# Patient Record
Sex: Female | Born: 1945 | Race: White | Hispanic: No | Marital: Married | Smoking: Never smoker
Health system: Southern US, Community
[De-identification: ages and names within clinical notes are randomized; demographics above are authoritative.]

## PROBLEM LIST (undated history)

## (undated) DIAGNOSIS — R112 Nausea with vomiting, unspecified: Secondary | ICD-10-CM

## (undated) DIAGNOSIS — K449 Diaphragmatic hernia without obstruction or gangrene: Secondary | ICD-10-CM

## (undated) DIAGNOSIS — Z923 Personal history of irradiation: Secondary | ICD-10-CM

## (undated) DIAGNOSIS — Z9889 Other specified postprocedural states: Secondary | ICD-10-CM

## (undated) DIAGNOSIS — M1990 Unspecified osteoarthritis, unspecified site: Secondary | ICD-10-CM

## (undated) DIAGNOSIS — Z803 Family history of malignant neoplasm of breast: Secondary | ICD-10-CM

## (undated) DIAGNOSIS — C50912 Malignant neoplasm of unspecified site of left female breast: Principal | ICD-10-CM

## (undated) HISTORY — PX: TONSILLECTOMY: SUR1361

## (undated) HISTORY — PX: BREAST LUMPECTOMY: SHX2

## (undated) HISTORY — DX: Family history of malignant neoplasm of breast: Z80.3

## (undated) HISTORY — PX: COLONOSCOPY: SHX174

## (undated) HISTORY — DX: Malignant neoplasm of unspecified site of left female breast: C50.912

---

## 2000-04-27 ENCOUNTER — Encounter: Payer: Self-pay | Admitting: Obstetrics and Gynecology

## 2000-04-27 ENCOUNTER — Encounter: Admission: RE | Admit: 2000-04-27 | Discharge: 2000-04-27 | Payer: Self-pay | Admitting: Obstetrics and Gynecology

## 2002-08-25 ENCOUNTER — Encounter: Payer: Self-pay | Admitting: Obstetrics and Gynecology

## 2002-08-25 ENCOUNTER — Encounter: Admission: RE | Admit: 2002-08-25 | Discharge: 2002-08-25 | Payer: Self-pay | Admitting: Obstetrics and Gynecology

## 2002-08-31 ENCOUNTER — Encounter: Payer: Self-pay | Admitting: Obstetrics and Gynecology

## 2002-08-31 ENCOUNTER — Encounter: Admission: RE | Admit: 2002-08-31 | Discharge: 2002-08-31 | Payer: Self-pay | Admitting: Obstetrics and Gynecology

## 2004-03-28 ENCOUNTER — Encounter: Admission: RE | Admit: 2004-03-28 | Discharge: 2004-03-28 | Payer: Self-pay | Admitting: Obstetrics and Gynecology

## 2005-05-14 ENCOUNTER — Encounter: Admission: RE | Admit: 2005-05-14 | Discharge: 2005-05-14 | Payer: Self-pay | Admitting: Obstetrics and Gynecology

## 2006-06-09 ENCOUNTER — Encounter: Admission: RE | Admit: 2006-06-09 | Discharge: 2006-06-09 | Payer: Self-pay | Admitting: Obstetrics and Gynecology

## 2006-09-10 ENCOUNTER — Encounter: Admission: RE | Admit: 2006-09-10 | Discharge: 2006-09-10 | Payer: Self-pay | Admitting: Family Medicine

## 2007-06-29 ENCOUNTER — Encounter: Admission: RE | Admit: 2007-06-29 | Discharge: 2007-06-29 | Payer: Self-pay | Admitting: Obstetrics & Gynecology

## 2008-07-03 ENCOUNTER — Encounter: Admission: RE | Admit: 2008-07-03 | Discharge: 2008-07-03 | Payer: Self-pay | Admitting: Obstetrics and Gynecology

## 2008-08-04 HISTORY — PX: OTHER SURGICAL HISTORY: SHX169

## 2009-07-04 ENCOUNTER — Encounter: Admission: RE | Admit: 2009-07-04 | Discharge: 2009-07-04 | Payer: Self-pay | Admitting: Obstetrics and Gynecology

## 2009-07-11 ENCOUNTER — Encounter: Admission: RE | Admit: 2009-07-11 | Discharge: 2009-07-11 | Payer: Self-pay | Admitting: Obstetrics and Gynecology

## 2010-01-11 ENCOUNTER — Encounter: Admission: RE | Admit: 2010-01-11 | Discharge: 2010-01-11 | Payer: Self-pay | Admitting: Family Medicine

## 2010-01-29 ENCOUNTER — Encounter: Admission: RE | Admit: 2010-01-29 | Discharge: 2010-01-29 | Payer: Self-pay | Admitting: Gastroenterology

## 2010-02-26 ENCOUNTER — Ambulatory Visit (HOSPITAL_COMMUNITY): Admission: RE | Admit: 2010-02-26 | Discharge: 2010-02-26 | Payer: Self-pay | Admitting: Gastroenterology

## 2010-05-03 ENCOUNTER — Ambulatory Visit (HOSPITAL_COMMUNITY): Admission: RE | Admit: 2010-05-03 | Discharge: 2010-05-05 | Payer: Self-pay | Admitting: General Surgery

## 2010-06-28 ENCOUNTER — Encounter: Admission: RE | Admit: 2010-06-28 | Discharge: 2010-06-28 | Payer: Self-pay | Admitting: General Surgery

## 2010-07-12 ENCOUNTER — Encounter
Admission: RE | Admit: 2010-07-12 | Discharge: 2010-07-12 | Payer: Self-pay | Source: Home / Self Care | Attending: Obstetrics and Gynecology | Admitting: Obstetrics and Gynecology

## 2010-07-18 ENCOUNTER — Encounter
Admission: RE | Admit: 2010-07-18 | Discharge: 2010-07-18 | Payer: Self-pay | Source: Home / Self Care | Attending: Gastroenterology | Admitting: Gastroenterology

## 2010-10-17 LAB — URINALYSIS, ROUTINE W REFLEX MICROSCOPIC
Glucose, UA: NEGATIVE mg/dL
Hgb urine dipstick: NEGATIVE
Ketones, ur: NEGATIVE mg/dL
pH: 5.5 (ref 5.0–8.0)

## 2010-10-17 LAB — COMPREHENSIVE METABOLIC PANEL
AST: 22 U/L (ref 0–37)
Albumin: 4.4 g/dL (ref 3.5–5.2)
CO2: 29 mEq/L (ref 19–32)
Calcium: 9.8 mg/dL (ref 8.4–10.5)
Creatinine, Ser: 0.66 mg/dL (ref 0.4–1.2)
GFR calc Af Amer: >60 mL/min (ref >60)
GFR calc non Af Amer: >60 mL/min (ref >60)
Sodium: 140 mEq/L (ref 135–145)
Total Protein: 7.4 g/dL (ref 6.0–8.3)

## 2010-10-17 LAB — CBC
MCH: 29 pg (ref 26.0–34.0)
MCHC: 33.6 g/dL (ref 30.0–36.0)
Platelets: 266 10*3/uL (ref 150–400)
RDW: 14.8 % (ref 11.5–15.5)

## 2010-10-17 LAB — DIFFERENTIAL
Eosinophils Relative: 5 % (ref 0–5)
Lymphocytes Relative: 37 % (ref 12–46)
Lymphs Abs: 1.9 10*3/uL (ref 0.7–4.0)
Monocytes Relative: 9 % (ref 3–12)

## 2010-10-17 LAB — SURGICAL PCR SCREEN: Staphylococcus aureus: NEGATIVE

## 2011-06-12 ENCOUNTER — Other Ambulatory Visit: Payer: Self-pay | Admitting: Obstetrics and Gynecology

## 2011-06-12 DIAGNOSIS — Z1231 Encounter for screening mammogram for malignant neoplasm of breast: Secondary | ICD-10-CM

## 2011-07-14 ENCOUNTER — Ambulatory Visit
Admission: RE | Admit: 2011-07-14 | Discharge: 2011-07-14 | Disposition: A | Discharge status: Home / Self Care | Payer: BC Managed Care – PPO | Source: Ambulatory Visit | Attending: Obstetrics and Gynecology | Admitting: Obstetrics and Gynecology

## 2011-07-14 ENCOUNTER — Other Ambulatory Visit: Payer: Self-pay | Admitting: Gastroenterology

## 2011-07-14 DIAGNOSIS — R935 Abnormal findings on diagnostic imaging of other abdominal regions, including retroperitoneum: Secondary | ICD-10-CM

## 2011-07-14 DIAGNOSIS — Z1231 Encounter for screening mammogram for malignant neoplasm of breast: Secondary | ICD-10-CM

## 2011-07-21 ENCOUNTER — Ambulatory Visit
Admission: RE | Admit: 2011-07-21 | Discharge: 2011-07-21 | Disposition: A | Discharge status: Home / Self Care | Payer: BC Managed Care – PPO | Source: Ambulatory Visit | Attending: Gastroenterology | Admitting: Gastroenterology

## 2011-07-21 DIAGNOSIS — R935 Abnormal findings on diagnostic imaging of other abdominal regions, including retroperitoneum: Secondary | ICD-10-CM

## 2012-06-09 ENCOUNTER — Other Ambulatory Visit: Payer: Self-pay | Admitting: Obstetrics and Gynecology

## 2012-06-09 DIAGNOSIS — Z1231 Encounter for screening mammogram for malignant neoplasm of breast: Secondary | ICD-10-CM

## 2012-07-19 ENCOUNTER — Ambulatory Visit
Admission: RE | Admit: 2012-07-19 | Discharge: 2012-07-19 | Disposition: A | Discharge status: Home / Self Care | Payer: Medicare Other | Source: Ambulatory Visit | Attending: Obstetrics and Gynecology | Admitting: Obstetrics and Gynecology

## 2012-07-19 DIAGNOSIS — Z1231 Encounter for screening mammogram for malignant neoplasm of breast: Secondary | ICD-10-CM

## 2012-07-20 ENCOUNTER — Other Ambulatory Visit: Payer: Self-pay | Admitting: Gastroenterology

## 2012-07-20 DIAGNOSIS — R935 Abnormal findings on diagnostic imaging of other abdominal regions, including retroperitoneum: Secondary | ICD-10-CM

## 2012-08-09 ENCOUNTER — Ambulatory Visit
Admission: RE | Admit: 2012-08-09 | Discharge: 2012-08-09 | Disposition: A | Discharge status: Home / Self Care | Payer: Medicare Other | Source: Ambulatory Visit | Attending: Gastroenterology | Admitting: Gastroenterology

## 2012-08-09 DIAGNOSIS — R935 Abnormal findings on diagnostic imaging of other abdominal regions, including retroperitoneum: Secondary | ICD-10-CM

## 2013-02-07 ENCOUNTER — Other Ambulatory Visit (HOSPITAL_COMMUNITY)
Admission: RE | Admit: 2013-02-07 | Discharge: 2013-02-07 | Disposition: A | Discharge status: Home / Self Care | Payer: Medicare Other | Source: Ambulatory Visit | Attending: Family Medicine | Admitting: Family Medicine

## 2013-02-07 ENCOUNTER — Other Ambulatory Visit: Payer: Self-pay | Admitting: Family Medicine

## 2013-02-07 DIAGNOSIS — Z124 Encounter for screening for malignant neoplasm of cervix: Secondary | ICD-10-CM | POA: Insufficient documentation

## 2013-02-07 DIAGNOSIS — Z1151 Encounter for screening for human papillomavirus (HPV): Secondary | ICD-10-CM | POA: Insufficient documentation

## 2013-04-13 ENCOUNTER — Other Ambulatory Visit: Payer: Self-pay | Admitting: Dermatology

## 2013-06-22 ENCOUNTER — Other Ambulatory Visit: Payer: Self-pay

## 2013-06-22 DIAGNOSIS — Z1231 Encounter for screening mammogram for malignant neoplasm of breast: Secondary | ICD-10-CM

## 2013-06-24 ENCOUNTER — Other Ambulatory Visit: Payer: Self-pay | Admitting: Dermatology

## 2013-07-25 ENCOUNTER — Ambulatory Visit
Admission: RE | Admit: 2013-07-25 | Discharge: 2013-07-25 | Disposition: A | Discharge status: Home / Self Care | Payer: Medicare Other | Source: Ambulatory Visit

## 2013-07-25 DIAGNOSIS — Z1231 Encounter for screening mammogram for malignant neoplasm of breast: Secondary | ICD-10-CM

## 2013-12-19 ENCOUNTER — Other Ambulatory Visit: Payer: Self-pay | Admitting: Orthopedic Surgery

## 2014-01-02 ENCOUNTER — Encounter (HOSPITAL_COMMUNITY): Payer: Self-pay

## 2014-01-02 NOTE — Pre-Procedure Instructions (Deleted)
TREVIA NOP  01/02/2014   Your procedure is scheduled on:  01/10/14  Report to [facility] Admitting at 530 AM.  Call this number if you have problems the morning of surgery: [phone]   Remember:   Do not eat food or drink liquids after midnight.   Take these medicines the morning of surgery with A SIP OF WATER: none   Do not wear jewelry, make-up or nail polish.  Do not wear lotions, powders, or perfumes. You may wear deodorant.  Do not shave 48 hours prior to surgery. Men may shave face and neck.  Do not bring valuables to the hospital.  [facility] is not responsible                  for any belongings or valuables.               Contacts, dentures or bridgework may not be worn into surgery.  Leave suitcase in the car. After surgery it may be brought to your room.  For patients admitted to the hospital, discharge time is determined by your                treatment team.               Patients discharged the day of surgery will not be allowed to drive  home.  Name and phone number of your driver: family  Special Instructions: Shower using CHG 2 nights before surgery and the night before surgery.  If you shower the day of surgery use CHG.  Use special wash - you have one bottle of CHG for all showers.  You should use approximately 1/3 of the bottle for each shower.   Please read over the following fact sheets that you were given: Pain Booklet, Coughing and Deep Breathing, Blood Transfusion Information, MRSA Information and Surgical Site Infection Prevention

## 2014-01-02 NOTE — Pre-Procedure Instructions (Deleted)
Melissa Holloway  01/02/2014   Your procedure is scheduled on:  01/10/14  Report to [facility] Admitting at 530 AM.  Call this number if you have problems the morning of surgery: [phone]   Remember:   Do not eat food or drink liquids after midnight.   Take these medicines the morning of surgery with A SIP OF WATER: none   Do not wear jewelry, make-up or nail polish.  Do not wear lotions, powders, or perfumes. You may wear deodorant.  Do not shave 48 hours prior to surgery. Men may shave face and neck.  Do not bring valuables to the hospital.  [name] is not responsible                  for any belongings or valuables.               Contacts, dentures or bridgework may not be worn into surgery.  Leave suitcase in the car. After surgery it may be brought to your room.  For patients admitted to the hospital, discharge time is determined by your                treatment team.               Patients discharged the day of surgery will not be allowed to drive  home.  Name and phone number of your driver: family  Special Instructions: Shower using CHG 2 nights before surgery and the night before surgery.  If you shower the day of surgery use CHG.  Use special wash - you have one bottle of CHG for all showers.  You should use approximately 1/3 of the bottle for each shower.   Please read over the following fact sheets that you were given: Pain Booklet, Coughing and Deep Breathing, Blood Transfusion Information, MRSA Information and Surgical Site Infection Prevention   

## 2014-01-02 NOTE — Pre-Procedure Instructions (Deleted)
Melissa Holloway  01/02/2014   Your procedure is scheduled on:  01/10/14  Report to [facility] Admitting at 530 AM.  Call this number if you have problems the morning of surgery: [phone]   Remember:   Do not eat food or drink liquids after midnight.   Take these medicines the morning of surgery with A SIP OF WATER: none   Do not wear jewelry, make-up or nail polish.  Do not wear lotions, powders, or perfumes. You may wear deodorant.  Do not shave 48 hours prior to surgery. Men may shave face and neck.  Do not bring valuables to the hospital.  [facility] is not responsible                  for any belongings or valuables.               Contacts, dentures or bridgework may not be worn into surgery.  Leave suitcase in the car. After surgery it may be brought to your room.  For patients admitted to the hospital, discharge time is determined by your                treatment team.               Patients discharged the day of surgery will not be allowed to drive  home.  Name and phone number of your driver: family  Special Instructions: Shower using CHG 2 nights before surgery and the night before surgery.  If you shower the day of surgery use CHG.  Use special wash - you have one bottle of CHG for all showers.  You should use approximately 1/3 of the bottle for each shower.   Please read over the following fact sheets that you were given: Pain Booklet, Coughing and Deep Breathing, Blood Transfusion Information, MRSA Information and Surgical Site Infection Prevention   

## 2014-01-03 ENCOUNTER — Encounter (HOSPITAL_COMMUNITY)
Admission: RE | Admit: 2014-01-03 | Discharge: 2014-01-03 | Disposition: A | Discharge status: Home / Self Care | Payer: Medicare Other | Source: Ambulatory Visit | Attending: Orthopedic Surgery | Admitting: Orthopedic Surgery

## 2014-01-03 ENCOUNTER — Encounter (HOSPITAL_COMMUNITY): Payer: Self-pay

## 2014-01-03 DIAGNOSIS — Z01812 Encounter for preprocedural laboratory examination: Secondary | ICD-10-CM | POA: Insufficient documentation

## 2014-01-03 DIAGNOSIS — Z01810 Encounter for preprocedural cardiovascular examination: Secondary | ICD-10-CM | POA: Diagnosis not present

## 2014-01-03 DIAGNOSIS — Z01818 Encounter for other preprocedural examination: Secondary | ICD-10-CM | POA: Insufficient documentation

## 2014-01-03 HISTORY — DX: Diaphragmatic hernia without obstruction or gangrene: K44.9

## 2014-01-03 LAB — TYPE AND SCREEN
ABO/RH(D): O POS
ANTIBODY SCREEN: NEGATIVE

## 2014-01-03 LAB — SURGICAL PCR SCREEN
MRSA, PCR: NEGATIVE
Staphylococcus aureus: NEGATIVE

## 2014-01-03 LAB — BASIC METABOLIC PANEL
BUN: 13 mg/dL (ref 6–23)
CO2: 26 mEq/L (ref 19–32)
CREATININE: 0.62 mg/dL (ref 0.50–1.10)
Calcium: 9.6 mg/dL (ref 8.4–10.5)
Chloride: 103 mEq/L (ref 96–112)
GFR calc Af Amer: >90 mL/min (ref >90)
GFR calc non Af Amer: >90 mL/min (ref >90)
Glucose, Bld: 95 mg/dL (ref 70–99)
Potassium: 3.9 mEq/L (ref 3.7–5.3)
Sodium: 141 mEq/L (ref 137–147)

## 2014-01-03 LAB — CBC WITH DIFFERENTIAL/PLATELET
BASOS PCT: 2 % — AB (ref 0–1)
Basophils Absolute: 0.1 10*3/uL (ref 0.0–0.1)
EOS PCT: 3 % (ref 0–5)
Eosinophils Absolute: 0.1 10*3/uL (ref 0.0–0.7)
HCT: 41.7 % (ref 36.0–46.0)
Hemoglobin: 13.7 g/dL (ref 12.0–15.0)
Lymphocytes Relative: 38 % (ref 12–46)
Lymphs Abs: 1.6 10*3/uL (ref 0.7–4.0)
MCH: 28.8 pg (ref 26.0–34.0)
MCHC: 32.9 g/dL (ref 30.0–36.0)
MCV: 87.8 fL (ref 78.0–100.0)
MONO ABS: 0.3 10*3/uL (ref 0.1–1.0)
Monocytes Relative: 6 % (ref 3–12)
NEUTROS ABS: 2.1 10*3/uL (ref 1.7–7.7)
Neutrophils Relative %: 51 % (ref 43–77)
PLATELETS: 247 10*3/uL (ref 150–400)
RBC: 4.75 MIL/uL (ref 3.87–5.11)
RDW: 14.4 % (ref 11.5–15.5)
WBC: 4.1 10*3/uL (ref 4.0–10.5)

## 2014-01-03 LAB — URINALYSIS, ROUTINE W REFLEX MICROSCOPIC
BILIRUBIN URINE: NEGATIVE
GLUCOSE, UA: NEGATIVE mg/dL
HGB URINE DIPSTICK: NEGATIVE
Ketones, ur: NEGATIVE mg/dL
Nitrite: NEGATIVE
Protein, ur: NEGATIVE mg/dL
SPECIFIC GRAVITY, URINE: 1.012 (ref 1.005–1.030)
UROBILINOGEN UA: 0.2 mg/dL (ref 0.0–1.0)
pH: 6 (ref 5.0–8.0)

## 2014-01-03 LAB — APTT: aPTT: 29 seconds (ref 24–37)

## 2014-01-03 LAB — PROTIME-INR
INR: 1.07 (ref 0.00–1.49)
PROTHROMBIN TIME: 13.7 s (ref 11.6–15.2)

## 2014-01-03 LAB — URINE MICROSCOPIC-ADD ON

## 2014-01-03 LAB — ABO/RH: ABO/RH(D): O POS

## 2014-01-03 NOTE — Pre-Procedure Instructions (Addendum)
Melissa Holloway  01/03/2014   Your procedure is scheduled on:  01/09/14  Report to [facility] short stay admitting at 530 AM.  Call this number if you have problems the morning of surgery: [phone]   Remember:   Do not eat food or drink liquids after midnight.   Take these medicines the morning of surgery with A SIP OF WATER: eye drops if needed        Take all meds until day of surgery except as instructed below or per dr     Bridgette Habermann all herbel meds, nsaids (aleve,naproxen,advil,ibuprofen) starting tomorrow including vitamins, herbal meds   Do not wear jewelry, make-up or nail polish.  Do not wear lotions, powders, or perfumes. You may wear deodorant.  Do not shave 48 hours prior to surgery. Men may shave face and neck.  Do not bring valuables to the hospital.  [facility] is not responsible                  for any belongings or valuables.               Contacts, dentures or bridgework may not be worn into surgery.  Leave suitcase in the car. After surgery it may be brought to your room.  For patients admitted to the hospital, discharge time is determined by your                treatment team.               Patients discharged the day of surgery will not be allowed to drive  home.  Name and phone number of your driver:   Special Instructions:  Special Instructions: [facility] - Preparing for Surgery  Before surgery, you can play an important role.  Because skin is not sterile, your skin needs to be as free of germs as possible.  You can reduce the number of germs on you skin by washing with CHG (chlorahexidine gluconate) soap before surgery.  CHG is an antiseptic cleaner which kills germs and bonds with the skin to continue killing germs even after washing.  Please DO NOT use if you have an allergy to CHG or antibacterial soaps.  If your skin becomes reddened/irritated stop using the CHG and inform your nurse when you arrive at Short Stay.  Do not shave (including legs and underarms)  for at least 48 hours prior to the first CHG shower.  You may shave your face.  Please follow these instructions carefully:   1.  Shower with CHG Soap the night before surgery and the morning of Surgery.  2.  If you choose to wash your hair, wash your hair first as usual with your normal shampoo.  3.  After you shampoo, rinse your hair and body thoroughly to remove the Shampoo.  4.  Use CHG as you would any other liquid soap.  You can apply chg directly  to the skin and wash gently with scrungie or a clean washcloth.  5.  Apply the CHG Soap to your body ONLY FROM THE NECK DOWN.  Do not use on open wounds or open sores.  Avoid contact with your eyes ears, mouth and genitals (private parts).  Wash genitals (private parts)       with your normal soap.  6.  Wash thoroughly, paying special attention to the area where your surgery will be performed.  7.  Thoroughly rinse your body with warm water from the neck down.  8.  DO NOT shower/wash with your normal soap after using and rinsing off the CHG Soap.  9.  Pat yourself dry with a clean towel.            10.  Wear clean pajamas.            11.  Place clean sheets on your bed the night of your first shower and do not sleep with pets.  Day of Surgery  Do not apply any lotions/deodorants the morning of surgery.  Please wear clean clothes to the hospital/surgery center.   Please read over the following fact sheets that you were given: Pain Booklet, Coughing and Deep Breathing, Blood Transfusion Information, Total Joint Packet, MRSA Information and Surgical Site Infection Prevention

## 2014-01-05 NOTE — H&P (Signed)
TOTAL KNEE ADMISSION H&P  Patient is being admitted for left total knee arthroplasty.  Subjective:  Chief Complaint:left knee pain.  HPI: Melissa Holloway, 68 y.o. female, has a history of pain and functional disability in the left knee due to arthritis and has failed non-surgical conservative treatments for greater than 12 weeks to includeNSAID's and/or analgesics, corticosteriod injections, flexibility and strengthening excercises and activity modification.  Onset of symptoms was gradual, starting 3 years ago with gradually worsening course since that time. The patient noted no past surgery on the left knee(s).  Patient currently rates pain in the left knee(s) at 10 out of 10 with activity. Patient has night pain, worsening of pain with activity and weight bearing, pain that interferes with activities of daily living, pain with passive range of motion, crepitus and joint swelling.  Patient has evidence of periarticular osteophytes and joint space narrowing by imaging studies.  There is no active infection.  There are no active problems to display for this patient.  Past Medical History  Diagnosis Date  . Esophageal hernia     s/p    Past Surgical History  Procedure Laterality Date  . Esophageal hernia  10  . Tonsillectomy      No prescriptions prior to admission   No Known Allergies  History  Substance Use Topics  . Smoking status: Never Smoker   . Smokeless tobacco: Not on file  . Alcohol Use: No    No family history on file.   Review of Systems  Constitutional: Negative.   HENT: Negative.   Eyes:       Wears glasses  Respiratory: Negative.   Cardiovascular: Negative.   Gastrointestinal: Negative.   Genitourinary: Negative.   Musculoskeletal: Positive for joint pain.  Skin: Negative.   Neurological: Negative.   Endo/Heme/Allergies: Bruises/bleeds easily.  Psychiatric/Behavioral: Negative.     Objective:  Physical Exam  Constitutional: She is oriented to person,  place, and time. She appears well-developed and well-nourished.  HENT:  Head: Normocephalic and atraumatic.  Eyes: Pupils are equal, round, and reactive to light.  Neck: Normal range of motion. Neck supple.  Cardiovascular: Intact distal pulses.   Respiratory: Effort normal.  Musculoskeletal: She exhibits tenderness.  Neurological: She is alert and oriented to person, place, and time.  Skin: Skin is warm and dry.  Psychiatric: She has a normal mood and affect. Her behavior is normal. Judgment and thought content normal.    Vital signs in last 24 hours:    Labs:   There is no height or weight on file to calculate BMI.   Imaging Review Radiographs:  X-rays were ordered, performed, and interpreted by me today included; standing AP and lateral x-ray of the left knee today showed continue to bone on bone arthritis now with erosion of the femur into the tibia medially and lateral subluxation of the tibia about 5 mm.  There are similar changes on the right knee as well, but that does not seem to hurt as much.  Assessment/Plan:  End stage arthritis, left knee   The patient history, physical examination, clinical judgment of the provider and imaging studies are consistent with end stage degenerative joint disease of the left knee(s) and total knee arthroplasty is deemed medically necessary. The treatment options including medical management, injection therapy arthroscopy and arthroplasty were discussed at length. The risks and benefits of total knee arthroplasty were presented and reviewed. The risks due to aseptic loosening, infection, stiffness, patella tracking problems, thromboembolic complications and other imponderables  were discussed. The patient acknowledged the explanation, agreed to proceed with the plan and consent was signed. Patient is being admitted for inpatient treatment for surgery, pain control, PT, OT, prophylactic antibiotics, VTE prophylaxis, progressive ambulation and ADL's  and discharge planning. The patient is planning to be discharged home with home health services

## 2014-01-08 MED ORDER — CEFAZOLIN SODIUM-DEXTROSE 2-3 GM-% IV SOLR
2.0000 g | INTRAVENOUS | Status: AC
  Administered 2014-01-09: 2 g via INTRAVENOUS
  Filled 2014-01-08: qty 50

## 2014-01-09 ENCOUNTER — Inpatient Hospital Stay (HOSPITAL_COMMUNITY): Payer: Medicare Other | Admitting: Anesthesiology

## 2014-01-09 ENCOUNTER — Encounter (HOSPITAL_COMMUNITY): Payer: Medicare Other | Admitting: Anesthesiology

## 2014-01-09 ENCOUNTER — Encounter (HOSPITAL_COMMUNITY): Payer: Self-pay | Admitting: Anesthesiology

## 2014-01-09 ENCOUNTER — Inpatient Hospital Stay (HOSPITAL_COMMUNITY)
Admission: RE | Admit: 2014-01-09 | Discharge: 2014-01-11 | DRG: 470 | Disposition: A | Discharge status: Home Health | Payer: Medicare Other | Source: Ambulatory Visit | Attending: Orthopedic Surgery | Admitting: Orthopedic Surgery

## 2014-01-09 ENCOUNTER — Encounter (HOSPITAL_COMMUNITY)
Admission: RE | Disposition: A | Discharge status: Home Health | Payer: Self-pay | Source: Ambulatory Visit | Attending: Orthopedic Surgery

## 2014-01-09 DIAGNOSIS — M1710 Unilateral primary osteoarthritis, unspecified knee: Principal | ICD-10-CM | POA: Diagnosis present

## 2014-01-09 DIAGNOSIS — M1712 Unilateral primary osteoarthritis, left knee: Secondary | ICD-10-CM

## 2014-01-09 HISTORY — PX: TOTAL KNEE ARTHROPLASTY: SHX125

## 2014-01-09 SURGERY — ARTHROPLASTY, KNEE, TOTAL
Anesthesia: General | Site: Knee | Laterality: Left

## 2014-01-09 MED ORDER — ROCURONIUM BROMIDE 50 MG/5ML IV SOLN
INTRAVENOUS | Status: AC
  Filled 2014-01-09: qty 1

## 2014-01-09 MED ORDER — HYDROMORPHONE HCL PF 1 MG/ML IJ SOLN
1.0000 mg | INTRAMUSCULAR | Status: DC | PRN
  Administered 2014-01-09 (×2): 1 mg via INTRAVENOUS
  Filled 2014-01-09 (×2): qty 1

## 2014-01-09 MED ORDER — DIPHENHYDRAMINE HCL 12.5 MG/5ML PO ELIX: 12.5000 mg | ORAL_SOLUTION | ORAL | Status: DC | PRN

## 2014-01-09 MED ORDER — DEXTROSE-NACL 5-0.45 % IV SOLN: INTRAVENOUS | Status: DC

## 2014-01-09 MED ORDER — OXYCODONE HCL 5 MG PO TABS
ORAL_TABLET | ORAL | Status: AC
  Filled 2014-01-09: qty 2

## 2014-01-09 MED ORDER — SUFENTANIL CITRATE 50 MCG/ML IV SOLN
INTRAVENOUS | Status: DC | PRN
  Administered 2014-01-09: 15 ug via INTRAVENOUS
  Administered 2014-01-09: 10 ug via INTRAVENOUS
  Administered 2014-01-09: 5 ug via INTRAVENOUS

## 2014-01-09 MED ORDER — MENTHOL 3 MG MT LOZG: 1.0000 | LOZENGE | OROMUCOSAL | Status: DC | PRN

## 2014-01-09 MED ORDER — KCL IN DEXTROSE-NACL 20-5-0.45 MEQ/L-%-% IV SOLN
INTRAVENOUS | Status: AC
  Filled 2014-01-09: qty 1000

## 2014-01-09 MED ORDER — HYDROMORPHONE HCL PF 1 MG/ML IJ SOLN
0.2500 mg | INTRAMUSCULAR | Status: DC | PRN
  Administered 2014-01-09 (×2): 0.5 mg via INTRAVENOUS
  Administered 2014-01-09 (×2): 0.25 mg via INTRAVENOUS

## 2014-01-09 MED ORDER — ONDANSETRON HCL 4 MG PO TABS: 4.0000 mg | ORAL_TABLET | Freq: Four times a day (QID) | ORAL | Status: DC | PRN

## 2014-01-09 MED ORDER — ROPIVACAINE HCL 5 MG/ML IJ SOLN
INTRAMUSCULAR | Status: DC | PRN
  Administered 2014-01-09: 25 mL via PERINEURAL

## 2014-01-09 MED ORDER — MAGNESIUM CITRATE PO SOLN: 1.0000 | Freq: Once | ORAL | Status: AC | PRN

## 2014-01-09 MED ORDER — MIDAZOLAM HCL 5 MG/5ML IJ SOLN
INTRAMUSCULAR | Status: DC | PRN
  Administered 2014-01-09: 2 mg via INTRAVENOUS

## 2014-01-09 MED ORDER — LIDOCAINE HCL 1 % IJ SOLN
INTRAMUSCULAR | Status: DC | PRN
  Administered 2014-01-09: 1 mL

## 2014-01-09 MED ORDER — ONDANSETRON HCL 4 MG/2ML IJ SOLN
INTRAMUSCULAR | Status: AC
  Filled 2014-01-09: qty 2

## 2014-01-09 MED ORDER — FENTANYL CITRATE 0.05 MG/ML IJ SOLN
INTRAMUSCULAR | Status: AC
  Filled 2014-01-09: qty 5

## 2014-01-09 MED ORDER — GLYCOPYRROLATE 0.2 MG/ML IJ SOLN
INTRAMUSCULAR | Status: DC | PRN
  Administered 2014-01-09: .4 mg via INTRAVENOUS

## 2014-01-09 MED ORDER — GLYCOPYRROLATE 0.2 MG/ML IJ SOLN
INTRAMUSCULAR | Status: AC
  Filled 2014-01-09: qty 2

## 2014-01-09 MED ORDER — ALUM & MAG HYDROXIDE-SIMETH 200-200-20 MG/5ML PO SUSP: 30.0000 mL | ORAL | Status: DC | PRN

## 2014-01-09 MED ORDER — SENNOSIDES-DOCUSATE SODIUM 8.6-50 MG PO TABS
1.0000 | ORAL_TABLET | Freq: Every evening | ORAL | Status: DC | PRN
Start: 2014-01-09 — End: 2014-01-11

## 2014-01-09 MED ORDER — SUFENTANIL CITRATE 50 MCG/ML IV SOLN
INTRAVENOUS | Status: AC
  Filled 2014-01-09: qty 1

## 2014-01-09 MED ORDER — OXYCODONE HCL 5 MG PO TABS
5.0000 mg | ORAL_TABLET | ORAL | Status: DC | PRN
  Administered 2014-01-09 (×2): 10 mg via ORAL
  Administered 2014-01-10: 5 mg via ORAL
  Administered 2014-01-10: 10 mg via ORAL
  Administered 2014-01-10: 5 mg via ORAL
  Administered 2014-01-10: 10 mg via ORAL
  Administered 2014-01-10: 5 mg via ORAL
  Administered 2014-01-11: 10 mg via ORAL
  Administered 2014-01-11: 5 mg via ORAL
  Filled 2014-01-09 (×2): qty 1
  Filled 2014-01-09 (×2): qty 2
  Filled 2014-01-09: qty 1
  Filled 2014-01-09 (×2): qty 2
  Filled 2014-01-09: qty 1
  Filled 2014-01-09: qty 2

## 2014-01-09 MED ORDER — TRANEXAMIC ACID 100 MG/ML IV SOLN
1000.0000 mg | INTRAVENOUS | Status: AC
  Administered 2014-01-09: 1000 mg via INTRAVENOUS
  Filled 2014-01-09: qty 10

## 2014-01-09 MED ORDER — ASPIRIN EC 325 MG PO TBEC
325.0000 mg | DELAYED_RELEASE_TABLET | Freq: Every day | ORAL | Status: DC
  Administered 2014-01-10 – 2014-01-11 (×2): 325 mg via ORAL
  Filled 2014-01-09 (×3): qty 1

## 2014-01-09 MED ORDER — BUPIVACAINE LIPOSOME 1.3 % IJ SUSP
20.0000 mL | Freq: Once | INTRAMUSCULAR | Status: DC
  Filled 2014-01-09: qty 20

## 2014-01-09 MED ORDER — ACETAMINOPHEN 650 MG RE SUPP: 650.0000 mg | Freq: Four times a day (QID) | RECTAL | Status: DC | PRN

## 2014-01-09 MED ORDER — ROCURONIUM BROMIDE 100 MG/10ML IV SOLN
INTRAVENOUS | Status: DC | PRN
  Administered 2014-01-09: 40 mg via INTRAVENOUS

## 2014-01-09 MED ORDER — CEFUROXIME SODIUM 1.5 G IJ SOLR
INTRAMUSCULAR | Status: DC | PRN
  Administered 2014-01-09: 1.5 g

## 2014-01-09 MED ORDER — PROPOFOL 10 MG/ML IV BOLUS
INTRAVENOUS | Status: AC
  Filled 2014-01-09: qty 20

## 2014-01-09 MED ORDER — LIDOCAINE HCL (CARDIAC) 20 MG/ML IV SOLN
INTRAVENOUS | Status: DC | PRN
  Administered 2014-01-09: 40 mg via INTRAVENOUS

## 2014-01-09 MED ORDER — ONDANSETRON HCL 4 MG/2ML IJ SOLN
INTRAMUSCULAR | Status: DC | PRN
  Administered 2014-01-09: 4 mg via INTRAVENOUS

## 2014-01-09 MED ORDER — OXYCODONE-ACETAMINOPHEN 5-325 MG PO TABS: 1.0000 | ORAL_TABLET | ORAL | Status: DC | PRN

## 2014-01-09 MED ORDER — LIDOCAINE HCL (CARDIAC) 20 MG/ML IV SOLN
INTRAVENOUS | Status: AC
  Filled 2014-01-09: qty 5

## 2014-01-09 MED ORDER — DOCUSATE SODIUM 100 MG PO CAPS
100.0000 mg | ORAL_CAPSULE | Freq: Two times a day (BID) | ORAL | Status: DC
  Administered 2014-01-09 – 2014-01-11 (×4): 100 mg via ORAL
  Filled 2014-01-09 (×4): qty 1

## 2014-01-09 MED ORDER — OXYCODONE HCL 5 MG/5ML PO SOLN: 5.0000 mg | Freq: Once | ORAL | Status: DC | PRN

## 2014-01-09 MED ORDER — SODIUM CHLORIDE 0.9 % IR SOLN
Status: DC | PRN
  Administered 2014-01-09: 3000 mL

## 2014-01-09 MED ORDER — METHOCARBAMOL 500 MG PO TABS: 500.0000 mg | ORAL_TABLET | Freq: Two times a day (BID) | ORAL | Status: DC

## 2014-01-09 MED ORDER — BISACODYL 5 MG PO TBEC: 5.0000 mg | DELAYED_RELEASE_TABLET | Freq: Every day | ORAL | Status: DC | PRN

## 2014-01-09 MED ORDER — ASPIRIN EC 325 MG PO TBEC
325.0000 mg | DELAYED_RELEASE_TABLET | Freq: Two times a day (BID) | ORAL | Status: DC
Start: 2014-01-09 — End: 2014-10-10

## 2014-01-09 MED ORDER — METOCLOPRAMIDE HCL 5 MG PO TABS
5.0000 mg | ORAL_TABLET | Freq: Three times a day (TID) | ORAL | Status: DC | PRN
  Filled 2014-01-09: qty 2

## 2014-01-09 MED ORDER — METOCLOPRAMIDE HCL 5 MG/ML IJ SOLN: 10.0000 mg | Freq: Once | INTRAMUSCULAR | Status: DC | PRN

## 2014-01-09 MED ORDER — PHENOL 1.4 % MT LIQD: 1.0000 | OROMUCOSAL | Status: DC | PRN

## 2014-01-09 MED ORDER — METHOCARBAMOL 500 MG PO TABS
ORAL_TABLET | ORAL | Status: AC
  Filled 2014-01-09: qty 1

## 2014-01-09 MED ORDER — ARTIFICIAL TEARS OP OINT
TOPICAL_OINTMENT | OPHTHALMIC | Status: DC | PRN
  Administered 2014-01-09: 1 via OPHTHALMIC

## 2014-01-09 MED ORDER — SODIUM CHLORIDE 0.9 % IJ SOLN
INTRAMUSCULAR | Status: AC
  Filled 2014-01-09: qty 10

## 2014-01-09 MED ORDER — NEOSTIGMINE METHYLSULFATE 10 MG/10ML IV SOLN
INTRAVENOUS | Status: AC
  Filled 2014-01-09: qty 1

## 2014-01-09 MED ORDER — METHOCARBAMOL 500 MG PO TABS
500.0000 mg | ORAL_TABLET | Freq: Four times a day (QID) | ORAL | Status: DC | PRN
  Administered 2014-01-09 – 2014-01-11 (×6): 500 mg via ORAL
  Filled 2014-01-09 (×5): qty 1

## 2014-01-09 MED ORDER — LACTATED RINGERS IV SOLN
INTRAVENOUS | Status: DC | PRN
  Administered 2014-01-09 (×2): via INTRAVENOUS

## 2014-01-09 MED ORDER — POLYVINYL ALCOHOL 1.4 % OP SOLN
1.0000 [drp] | Freq: Every day | OPHTHALMIC | Status: DC
  Filled 2014-01-09: qty 15

## 2014-01-09 MED ORDER — OXYCODONE HCL 5 MG PO TABS: 5.0000 mg | ORAL_TABLET | Freq: Once | ORAL | Status: DC | PRN

## 2014-01-09 MED ORDER — ONDANSETRON HCL 4 MG/2ML IJ SOLN
4.0000 mg | Freq: Four times a day (QID) | INTRAMUSCULAR | Status: DC | PRN
  Administered 2014-01-09: 4 mg via INTRAVENOUS
  Filled 2014-01-09: qty 2

## 2014-01-09 MED ORDER — PROPYLENE GLYCOL 0.6 % OP SOLN: 1.0000 [drp] | Freq: Every morning | OPHTHALMIC | Status: DC

## 2014-01-09 MED ORDER — METOCLOPRAMIDE HCL 5 MG/ML IJ SOLN
5.0000 mg | Freq: Three times a day (TID) | INTRAMUSCULAR | Status: DC | PRN
  Administered 2014-01-09: 10 mg via INTRAVENOUS
  Filled 2014-01-09: qty 2

## 2014-01-09 MED ORDER — SODIUM CHLORIDE 0.9 % IJ SOLN
INTRAMUSCULAR | Status: DC | PRN
  Administered 2014-01-09: 40 mL

## 2014-01-09 MED ORDER — ATORVASTATIN CALCIUM 20 MG PO TABS
20.0000 mg | ORAL_TABLET | Freq: Every day | ORAL | Status: DC
  Administered 2014-01-09 – 2014-01-11 (×3): 20 mg via ORAL
  Filled 2014-01-09 (×3): qty 1

## 2014-01-09 MED ORDER — PHENYLEPHRINE HCL 10 MG/ML IJ SOLN
INTRAMUSCULAR | Status: DC | PRN
  Administered 2014-01-09 (×2): 80 ug via INTRAVENOUS

## 2014-01-09 MED ORDER — CHLORHEXIDINE GLUCONATE 4 % EX LIQD
60.0000 mL | Freq: Once | CUTANEOUS | Status: DC
  Filled 2014-01-09: qty 60

## 2014-01-09 MED ORDER — MIDAZOLAM HCL 2 MG/2ML IJ SOLN
INTRAMUSCULAR | Status: AC
  Filled 2014-01-09: qty 2

## 2014-01-09 MED ORDER — KCL IN DEXTROSE-NACL 20-5-0.45 MEQ/L-%-% IV SOLN
INTRAVENOUS | Status: DC
  Administered 2014-01-09: 13:00:00 via INTRAVENOUS
  Filled 2014-01-09 (×8): qty 1000

## 2014-01-09 MED ORDER — ACETAMINOPHEN 325 MG PO TABS
650.0000 mg | ORAL_TABLET | Freq: Four times a day (QID) | ORAL | Status: DC | PRN
Start: 2014-01-09 — End: 2014-01-11

## 2014-01-09 MED ORDER — METHOCARBAMOL 1000 MG/10ML IJ SOLN
500.0000 mg | Freq: Four times a day (QID) | INTRAVENOUS | Status: DC | PRN
  Filled 2014-01-09: qty 5

## 2014-01-09 MED ORDER — BUPIVACAINE LIPOSOME 1.3 % IJ SUSP
INTRAMUSCULAR | Status: DC | PRN
  Administered 2014-01-09: 20 mL

## 2014-01-09 MED ORDER — HYDROMORPHONE HCL PF 1 MG/ML IJ SOLN
INTRAMUSCULAR | Status: AC
  Filled 2014-01-09: qty 1

## 2014-01-09 MED ORDER — NEOSTIGMINE METHYLSULFATE 10 MG/10ML IV SOLN
INTRAVENOUS | Status: DC | PRN
  Administered 2014-01-09: 3 mg via INTRAVENOUS

## 2014-01-09 MED ORDER — PROPOFOL 10 MG/ML IV BOLUS
INTRAVENOUS | Status: DC | PRN
  Administered 2014-01-09: 200 mg via INTRAVENOUS

## 2014-01-09 MED ORDER — CEFUROXIME SODIUM 1.5 G IJ SOLR
INTRAMUSCULAR | Status: AC
  Filled 2014-01-09: qty 1.5

## 2014-01-09 SURGICAL SUPPLY — 61 items
BANDAGE ELASTIC 6 VELCRO ST LF (GAUZE/BANDAGES/DRESSINGS) ×1 IMPLANT
BANDAGE ESMARK 6X9 LF (GAUZE/BANDAGES/DRESSINGS) ×1 IMPLANT
BLADE SAG 18X100X1.27 (BLADE) ×2 IMPLANT
BLADE SAW SGTL 13X75X1.27 (BLADE) ×2 IMPLANT
BNDG CMPR 9X6 STRL LF SNTH (GAUZE/BANDAGES/DRESSINGS) ×1
BNDG CMPR MED 10X6 ELC LF (GAUZE/BANDAGES/DRESSINGS) ×1
BNDG ELASTIC 6X10 VLCR STRL LF (GAUZE/BANDAGES/DRESSINGS) ×2 IMPLANT
BNDG ESMARK 6X9 LF (GAUZE/BANDAGES/DRESSINGS) ×2
BOWL SMART MIX CTS (DISPOSABLE) ×2 IMPLANT
CAPT RP KNEE ×1 IMPLANT
CEMENT HV SMART SET (Cement) ×4 IMPLANT
COVER SURGICAL LIGHT HANDLE (MISCELLANEOUS) ×2 IMPLANT
CUFF TOURNIQUET SINGLE 34IN LL (TOURNIQUET CUFF) ×1 IMPLANT
DRAPE EXTREMITY T 121X128X90 (DRAPE) ×2 IMPLANT
DRAPE U-SHAPE 47X51 STRL (DRAPES) ×2 IMPLANT
DURAPREP 26ML APPLICATOR (WOUND CARE) ×4 IMPLANT
ELECT REM PT RETURN 9FT ADLT (ELECTROSURGICAL) ×2
ELECTRODE REM PT RTRN 9FT ADLT (ELECTROSURGICAL) ×1 IMPLANT
EVACUATOR 1/8 PVC DRAIN (DRAIN) ×2 IMPLANT
GAUZE XEROFORM 1X8 LF (GAUZE/BANDAGES/DRESSINGS) ×2 IMPLANT
GLOVE BIO SURGEON STRL SZ7 (GLOVE) ×1 IMPLANT
GLOVE BIO SURGEON STRL SZ7.5 (GLOVE) ×4 IMPLANT
GLOVE BIO SURGEON STRL SZ8.5 (GLOVE) ×2 IMPLANT
GLOVE BIOGEL PI IND STRL 6.5 (GLOVE) IMPLANT
GLOVE BIOGEL PI IND STRL 8 (GLOVE) ×1 IMPLANT
GLOVE BIOGEL PI IND STRL 9 (GLOVE) ×1 IMPLANT
GLOVE BIOGEL PI INDICATOR 6.5 (GLOVE) ×1
GLOVE BIOGEL PI INDICATOR 8 (GLOVE) ×1
GLOVE BIOGEL PI INDICATOR 9 (GLOVE) ×1
GOWN STRL REUS W/ TWL LRG LVL3 (GOWN DISPOSABLE) ×1 IMPLANT
GOWN STRL REUS W/ TWL XL LVL3 (GOWN DISPOSABLE) ×2 IMPLANT
GOWN STRL REUS W/TWL LRG LVL3 (GOWN DISPOSABLE) ×2
GOWN STRL REUS W/TWL XL LVL3 (GOWN DISPOSABLE) ×4
HANDPIECE INTERPULSE COAX TIP (DISPOSABLE) ×2
HOOD PEEL AWAY FACE SHEILD DIS (HOOD) ×4 IMPLANT
KIT BASIN OR (CUSTOM PROCEDURE TRAY) ×2 IMPLANT
KIT ROOM TURNOVER OR (KITS) ×2 IMPLANT
MANIFOLD NEPTUNE II (INSTRUMENTS) ×2 IMPLANT
NDL SPNL 18GX3.5 QUINCKE PK (NEEDLE) IMPLANT
NEEDLE 22X1 1/2 (OR ONLY) (NEEDLE) ×2 IMPLANT
NEEDLE SPNL 18GX3.5 QUINCKE PK (NEEDLE) ×2 IMPLANT
NS IRRIG 1000ML POUR BTL (IV SOLUTION) ×2 IMPLANT
PACK TOTAL JOINT (CUSTOM PROCEDURE TRAY) ×2 IMPLANT
PAD ARMBOARD 7.5X6 YLW CONV (MISCELLANEOUS) ×4 IMPLANT
PADDING CAST COTTON 6X4 STRL (CAST SUPPLIES) ×2 IMPLANT
SET HNDPC FAN SPRY TIP SCT (DISPOSABLE) ×1 IMPLANT
SPONGE GAUZE 4X4 12PLY (GAUZE/BANDAGES/DRESSINGS) ×4 IMPLANT
STAPLER VISISTAT 35W (STAPLE) ×2 IMPLANT
SUCTION FRAZIER TIP 10 FR DISP (SUCTIONS) ×2 IMPLANT
SUT VIC AB 0 CT1 27 (SUTURE) ×2
SUT VIC AB 0 CT1 27XBRD ANBCTR (SUTURE) IMPLANT
SUT VIC AB 0 CTX 36 (SUTURE) ×2
SUT VIC AB 0 CTX36XBRD ANTBCTR (SUTURE) ×1 IMPLANT
SUT VIC AB 1 CTX 36 (SUTURE) ×2
SUT VIC AB 1 CTX36XBRD ANBCTR (SUTURE) ×1 IMPLANT
SUT VIC AB 2-0 CT1 27 (SUTURE) ×2
SUT VIC AB 2-0 CT1 TAPERPNT 27 (SUTURE) ×1 IMPLANT
SYR 30ML LL (SYRINGE) ×2 IMPLANT
SYR 50ML LL SCALE MARK (SYRINGE) ×2 IMPLANT
TOWEL OR 17X24 6PK STRL BLUE (TOWEL DISPOSABLE) ×2 IMPLANT
TOWEL OR 17X26 10 PK STRL BLUE (TOWEL DISPOSABLE) ×2 IMPLANT

## 2014-01-09 NOTE — Progress Notes (Signed)
Utilization review completed.  

## 2014-01-09 NOTE — Plan of Care (Signed)
Problem: Consults Goal: Diagnosis- Total Joint Replacement Primary Total Knee Left     

## 2014-01-09 NOTE — Op Note (Signed)
PATIENT ID:      Melissa Holloway  MRN:     [id number] DOB/AGE:    07-Dec-1945 / 68 y.o.       OPERATIVE REPORT    DATE OF PROCEDURE:  01/09/2014       PREOPERATIVE DIAGNOSIS:   OSTEOARTHRITIS LEFT KNEE      Estimated body mass index is 32.94 kg/(m^2) as calculated from the following:   Height as of this encounter: 5\' 4"  (1.626 m).   Weight as of this encounter: 87.091 kg (192 lb).                                                        POSTOPERATIVE DIAGNOSIS:   OSTEOARTHRITIS LEFT KNEE                                                                      PROCEDURE:  Procedure(s): LEFT TOTAL KNEE ARTHROPLASTY Using Depuy Sigma RP implants #4NL Femur, #4Tibia, 36mm Sigma RP bearing, 38 Patella     SURGEON: Kerin Salen    ASSISTANT:   Kerry Hough. [employer]   (Present and scrubbed throughout the case, critical for assistance with exposure, retraction, instrumentation, and closure.)         ANESTHESIA: GET, ACB, Exparel  DRAINS: 2 medium hemovac in knee   TOURNIQUET TIME: 32KGU   COMPLICATIONS:  None     SPECIMENS: None   INDICATIONS FOR PROCEDURE: The patient has  OSTEOARTHRITIS LEFT KNEE, varus deformities, XR shows bone on bone arthritis. Patient has failed all conservative measures including anti-inflammatory medicines, narcotics, attempts at  exercise and weight loss, cortisone injections and viscosupplementation.  Risks and benefits of surgery have been discussed, questions answered.   DESCRIPTION OF PROCEDURE: The patient identified by armband, received  IV antibiotics, in the holding area at [facility]. Patient taken to the operating room, appropriate anesthetic  monitors were attached, and general endotracheal anesthesia induced with  the patient in supine position, Foley catheter was inserted. Tourniquet  applied high to the operative thigh. Lateral post and foot positioner  applied to the table, the lower extremity was then prepped and draped  in usual sterile fashion  from the ankle to the tourniquet. Time-out procedure was performed. The limb was wrapped with an Esmarch bandage and the tourniquet inflated to 350 mmHg. We began the operation by making the anterior midline incision starting at handbreadth above the patella going over the patella 1 cm medial to and  4 cm distal to the tibial tubercle. Small bleeders in the skin and the  subcutaneous tissue identified and cauterized. Transverse retinaculum was incised and reflected medially and a medial parapatellar arthrotomy was accomplished. the patella was everted and theprepatellar fat pad resected. The superficial medial collateral  ligament was then elevated from anterior to posterior along the proximal  flare of the tibia and anterior half of the menisci resected. The knee was hyperflexed exposing bone on bone arthritis. Peripheral and notch osteophytes as well as the cruciate ligaments were then resected. We continued to  work our way  around posteriorly along the proximal tibia, and externally  rotated the tibia subluxing it out from underneath the femur. A McHale  retractor was placed through the notch and a lateral Hohmann retractor  placed, and we then drilled through the proximal tibia in line with the  axis of the tibia followed by an intramedullary guide rod and 2-degree  posterior slope cutting guide. The tibial cutting guide was pinned into place  allowing resection of 5 mm of bone medially and about 9 mm of bone  laterally because of her varus deformity. Satisfied with the tibial resection, we then  entered the distal femur 2 mm anterior to the PCL origin with the  intramedullary guide rod and applied the distal femoral cutting guide  set at 39mm, with 5 degrees of valgus. This was pinned along the  epicondylar axis. At this point, the distal femoral cut was accomplished without difficulty. We then sized for a #4NL femoral component and pinned the guide in 3 degrees of external rotation.The chamfer  cutting guide was pinned into place. The anterior, posterior, and chamfer cuts were accomplished without difficulty followed by  the box cutting guide and the box cut. We also removed posterior osteophytes from the posterior femoral condyles. At this  time, the knee was brought into full extension. We checked our  extension and flexion gaps and found them symmetric at 36mm.  The patella thickness measured at 25 mm. We set the cutting guide at 15 and removed the posterior 10 mm  of the patella, sized for a 38 button and drilled the lollipop. The knee  was then once again hyperflexed exposing the proximal tibia. We sized for a #4 tibial base plate, applied the smokestack and the conical reamer followed by the the Delta fin keel punch. We then hammered into place the Sigma RP trial femoral component, inserted a 10-mm trial bearing, trial patellar button, and took the knee through range of motion from 0-130 degrees. No thumb pressure was required for patellar  tracking. At this point, all trial components were removed, a double batch of DePuy HV cement with 1500 mg of Zinacef was mixed and applied to all bony metallic mating surfaces except for the posterior condyles of the femur itself. In order, we  hammered into place the tibial tray and removed excess cement, the femoral component and removed excess cement, a 10-mm Sigma RP bearing  was inserted, and the knee brought to full extension with compression.  The patellar button was clamped into place, and excess cement  removed. While the cement cured the wound was irrigated out with normal saline solution pulse lavage, and medium Hemovac drains were placed from an anterolateral  approach. Ligament stability and patellar tracking were checked and found to be excellent. The parapatellar arthrotomy was closed with  running #1 Vicryl suture. The subcutaneous tissue with 0 and 2-0 undyed  Vicryl suture, and the skin with skin staples. A dressing of Xeroform,   4 x 4, dressing sponges, Webril, and Ace wrap applied. The patient  awakened, extubated, and taken to recovery room without difficulty.   Kerin Salen 01/09/2014, 8:50 AM

## 2014-01-09 NOTE — Evaluation (Signed)
Occupational Therapy Evaluation Patient Details Name: Melissa Holloway MRN: [id number] DOB: [birth year] Today's Date: 01/09/2014    History of Present Illness 67 y.o. female s/p left TKA.   Clinical Impression   Pt s/p above. Pt independent with ADLs. PTA. Feel pt will benefit from acute OT to increase independence prior to d/c.     Follow Up Recommendations  No OT follow up;Supervision - Intermittent    Equipment Recommendations  3 in 1 bedside comode    Recommendations for Other Services       Precautions / Restrictions Precautions Precautions: Knee Precaution Comments: Reviewed precautions Restrictions Weight Bearing Restrictions: Yes LLE Weight Bearing: Weight bearing as tolerated      Mobility Bed Mobility Overal bed mobility: Needs Assistance Bed Mobility: Sit to Supine     Sit to supine: Min assist   General bed mobility comments: Helped adjust LLE in bed.   Transfers Overall transfer level: Needs assistance Equipment used: Rolling walker (2 wheeled) Transfers: Sit to/from [employer] Sit to Stand: Min guard Stand pivot transfers: Min guard       General transfer comment: cues for technique. Min guard for safety.         ADL Overall ADL's : Needs assistance/impaired     Grooming: Min guard;Standing;Wash/dry hands;Sitting               Lower Body Dressing: Min guard;Sit to/from stand   Toilet Transfer: Min guard;Stand-pivot;Ambulation;RW;BSC (took a few side steps)   Toileting- Water quality scientist and Hygiene: Min guard;Sit to/from stand       Functional mobility during ADLs: Min guard;Rolling walker General ADL Comments: Educated on safe shoewear, rugs, use of bag on walker, as well as briefly discussed AE. Educated on dressing technique. Recommended sitting for LB ADLs. Educated on shower transfer technique.  Educated on benefit of reaching down to left foot for ADLs as it increases ROM in knee. Explained use of 3 in 1.      Vision                     Perception     Praxis      Pertinent Vitals/Pain Pain 5/10. Repositioned.      Hand Dominance Right   Extremity/Trunk Assessment Upper Extremity Assessment Upper Extremity Assessment: Overall WFL for tasks assessed   Lower Extremity Assessment Lower Extremity Assessment: Defer to PT evaluation LLE Deficits / Details: decreased strength and ROM - unable to lift LLE againts gravity in supine position LLE: Unable to fully assess due to pain       Communication Communication Communication: No difficulties   Cognition Arousal/Alertness: Awake/alert Behavior During Therapy: WFL for tasks assessed/performed Overall Cognitive Status: Within Functional Limits for tasks assessed                     General Comments          Shoulder Instructions      Home Living Family/patient expects to be discharged to:: Private residence Living Arrangements: Spouse/significant other Available Help at Discharge: Family;Available 24 hours/day Type of Home: House Home Access: Stairs to enter [employer] of Steps: 3 Entrance Stairs-Rails: None Home Layout: One level     Bathroom Shower/Tub: Tub/shower unit;Walk-in shower   Bathroom Toilet: Standard     Home Equipment: Environmental consultant - 2 wheels;Cane - single point;Shower seat - built in          Prior Functioning/Environment Level of Independence: Independent  OT Diagnosis: Acute pain   OT Problem List: Decreased strength;Decreased activity tolerance;Decreased knowledge of use of DME or AE;Decreased knowledge of precautions;Pain   OT Treatment/Interventions: Self-care/ADL training;DME and/or AE instruction;Therapeutic activities;Patient/family education;Balance training    OT Goals(Current goals can be found in the care plan section) Acute Rehab OT Goals Patient Stated Goal: not stated OT Goal Formulation: With patient Time For Goal Achievement:  01/16/14 Potential to Achieve Goals: Good ADL Goals Pt Will Perform Lower Body Dressing: with modified independence;sit to/from stand Pt Will Transfer to Toilet: with modified independence;ambulating (3 in 1 over commode) Pt Will Perform Toileting - Clothing Manipulation and hygiene: with modified independence;sit to/from stand Pt Will Perform Tub/Shower Transfer: Shower transfer;with supervision;ambulating;shower seat;rolling walker  OT Frequency: Min 2X/week   Barriers to D/C:            Co-evaluation              End of Session Equipment Utilized During Treatment: Gait belt;Rolling walker;Oxygen CPM Left Knee CPM Left Knee: Off  Activity Tolerance: Other (comment) (nauseous) Patient left: in bed;with call bell/phone within reach;with family/visitor present   Time: 3235-5732 OT Time Calculation (min): 22 min Charges:  OT General Charges $OT Visit: 1 Procedure OT Evaluation $Initial OT Evaluation Tier I: 1 Procedure OT Treatments $Self Care/Home Management : 8-22 mins G-Codes:    Benito Mccreedy OTR/L [phone] 01/09/2014, 4:45 PM

## 2014-01-09 NOTE — Interval H&P Note (Signed)
History and Physical Interval Note:  01/09/2014 7:20 AM  Melissa Holloway  has presented today for surgery, with the diagnosis of OSTEOARTHRITIS LEFT KNEE  The various methods of treatment have been discussed with the patient and family. After consideration of risks, benefits and other options for treatment, the patient has consented to  Procedure(s): LEFT TOTAL KNEE ARTHROPLASTY (Left) as a surgical intervention .  The patient's history has been reviewed, patient examined, no change in status, stable for surgery.  I have reviewed the patient's chart and labs.  Questions were answered to the patient's satisfaction.     Kerin Salen

## 2014-01-09 NOTE — Anesthesia Preprocedure Evaluation (Addendum)
Anesthesia Evaluation  Patient identified by MRN, date of birth, ID band Patient awake    Reviewed: Allergy & Precautions, H&P , NPO status , Patient's Chart, lab work & pertinent test results, reviewed documented beta blocker date and time   Airway Mallampati: II TM Distance: >3 FB Neck ROM: full    Dental   Pulmonary neg pulmonary ROS,  breath sounds clear to auscultation        Cardiovascular negative cardio ROS  Rhythm:regular     Neuro/Psych  Neuromuscular disease negative psych ROS   GI/Hepatic Neg liver ROS, hiatal hernia,   Endo/Other  negative endocrine ROS  Renal/GU negative Renal ROS  negative genitourinary   Musculoskeletal   Abdominal   Peds  Hematology negative hematology ROS (+)   Anesthesia Other Findings See surgeon's H&P   Reproductive/Obstetrics negative OB ROS                           Anesthesia Physical Anesthesia Plan  ASA: II  Anesthesia Plan: General   Post-op Pain Management:    Induction: Intravenous  Airway Management Planned: Oral ETT  Additional Equipment:   Intra-op Plan:   Post-operative Plan:   Informed Consent: I have reviewed the patients History and Physical, chart, labs and discussed the procedure including the risks, benefits and alternatives for the proposed anesthesia with the patient or authorized representative who has indicated his/her understanding and acceptance.   Dental Advisory Given  Plan Discussed with: CRNA and Surgeon  Anesthesia Plan Comments:         Anesthesia Quick Evaluation

## 2014-01-09 NOTE — Progress Notes (Signed)
Orthopedic Tech Progress Note Patient Details:  Melissa Holloway [birth year] [id number]  CPM Left Knee CPM Left Knee: On Left Knee Flexion (Degrees): 60 Left Knee Extension (Degrees): 0 Additional Comments: trapeze bar and foot roll   Theodoro Parma Cammer 01/09/2014, 10:40 AM

## 2014-01-09 NOTE — Anesthesia Postprocedure Evaluation (Signed)
Anesthesia Post Note  Patient: Melissa Holloway  Procedure(s) Performed: Procedure(s) (LRB): LEFT TOTAL KNEE ARTHROPLASTY (Left)  Anesthesia type: General  Patient location: PACU  Post pain: Pain level controlled  Post assessment: Patient's Cardiovascular Status Stable  Last Vitals:  Filed Vitals:   01/09/14 1000  BP: 120/57  Pulse: 61  Temp:   Resp:     Post vital signs: Reviewed and stable  Level of consciousness: alert  Complications: No apparent anesthesia complications

## 2014-01-09 NOTE — Transfer of Care (Signed)
Immediate Anesthesia Transfer of Care Note  Patient: Melissa Holloway  Procedure(s) Performed: Procedure(s): LEFT TOTAL KNEE ARTHROPLASTY (Left)  Patient Location: PACU  Anesthesia Type:General  Level of Consciousness: awake, alert  and patient cooperative  Airway & Oxygen Therapy: Patient Spontanous Breathing and Patient connected to face mask oxygen  Post-op Assessment: Report given to PACU RN, Post -op Vital signs reviewed and stable, Patient moving all extremities and Patient moving all extremities X 4  Post vital signs: Reviewed and stable  Complications: No apparent anesthesia complications

## 2014-01-09 NOTE — Anesthesia Procedure Notes (Addendum)
Anesthesia Regional Block:  Femoral nerve block  Pre-Anesthetic Checklist: ,, timeout performed, Correct Patient, Correct Site, Correct Laterality, Correct Procedure, Correct Position, site marked, Risks and benefits discussed,  Surgical consent,  Pre-op evaluation,  At surgeon's request and post-op pain management  Laterality: Left  Prep: chloraprep       Needles:   Needle Type: Other     Needle Length: 9cm 9 cm Needle Gauge: 21 and 21 G    Additional Needles:  Procedures: ultrasound guided (picture in chart) Femoral nerve block Narrative:  Start time: 01/09/2014 7:00 AM End time: 01/09/2014 7:07 AM Injection made incrementally with aspirations every 5 mL.  Performed by: Personally  Anesthesiologist: C. Frederick MD  Additional Notes: Ultrasound guidance used to: id relevant anatomy, confirm needle position, local anesthetic spread, avoidance of vascular puncture. Picture saved. No complications. Block performed personally by Jessy Oto. Albertina Parr, MD     Procedure Name: Intubation Date/Time: 01/09/2014 7:42 AM Performed by: Izora Gala Pre-anesthesia Checklist: Patient identified, Emergency Drugs available, Suction available and Patient being monitored Patient Re-evaluated:Patient Re-evaluated prior to inductionOxygen Delivery Method: Circle system utilized Preoxygenation: Pre-oxygenation with 100% oxygen Intubation Type: IV induction Ventilation: Mask ventilation without difficulty Laryngoscope Size: Miller and 3 Grade View: Grade I Tube type: Oral Number of attempts: 1 Airway Equipment and Method: Stylet,  LTA kit utilized and Bite block Placement Confirmation: ETT inserted through vocal cords under direct vision,  positive ETCO2 and breath sounds checked- equal and bilateral Secured at: 22 cm Dental Injury: Teeth and Oropharynx as per pre-operative assessment

## 2014-01-09 NOTE — Evaluation (Signed)
Physical Therapy Evaluation Patient Details Name: Melissa Holloway MRN: [id number] DOB: 1945-12-29 Today's Date: 01/09/2014   History of Present Illness  68 y.o. female s/p left TKA.  Clinical Impression  Pt is s/p left TKA resulting in the deficits listed below (see PT Problem List). Pt will benefit from skilled PT to increase their independence and safety with mobility to allow discharge to the venue listed below. Pt lives with husband who will be available 24 hr upon d/c. Anticipate pt will do well and be adequate for a safe d/c home with HHPT to further increase functional independence.     Follow Up Recommendations Home health PT;Supervision for mobility/OOB    Equipment Recommendations  3in1 (PT)    Recommendations for Other Services OT consult     Precautions / Restrictions Precautions Precautions: Knee Precaution Comments: Reviewed knee precautions and WB status Restrictions Weight Bearing Restrictions: Yes LLE Weight Bearing: Weight bearing as tolerated      Mobility  Bed Mobility Overal bed mobility: Needs Assistance Bed Mobility: Supine to Sit     Supine to sit: Min assist     General bed mobility comments: Min assist to support LLE off of bed. HOB flat and VCs for technique  Transfers Overall transfer level: Needs assistance Equipment used: Rolling walker (2 wheeled) Transfers: Sit to/from Stand Sit to Stand: Supervision         General transfer comment: Supervision for safety with VCs for hand placement. Pt requires extra time.  Ambulation/Gait Ambulation/Gait assistance: Min assist Ambulation Distance (Feet): 8 Feet Assistive device: Rolling walker (2 wheeled) Gait Pattern/deviations: Step-to pattern;Decreased step length - right;Decreased step length - left;Decreased stance time - left;Decreased stride length;Antalgic   Gait velocity interpretation: Below normal speed for age/gender General Gait Details: Ambulated short distance to chair in room.  Min assist for tactile cues to extend left knee in stance phase with quad activation. One instance of knee buckling however was able to support herself safely with RW.  Stairs            Wheelchair Mobility    Modified Rankin (Stroke Patients Only)       Balance Overall balance assessment: Needs assistance Sitting-balance support: No upper extremity supported;Feet supported Sitting balance-Leahy Scale: Good     Standing balance support: Single extremity supported Standing balance-Leahy Scale: Poor                               Pertinent Vitals/Pain 4/10 pain Scant emesis upon sitting with nausea that persisted throughout therapy session Nurse notified Patient repositioned in chair for comfort.     Home Living Family/patient expects to be discharged to:: Private residence Living Arrangements: Spouse/significant other Available Help at Discharge: Family;Available 24 hours/day Type of Home: House Home Access: Stairs to enter Entrance Stairs-Rails: None Entrance Stairs-Number of Steps: 3 Home Layout: One level Home Equipment: Walker - 2 wheels;Cane - single point;Shower seat - built in      Prior Function Level of Independence: Independent               Hand Dominance   Dominant Hand: Right    Extremity/Trunk Assessment   Upper Extremity Assessment: Overall WFL for tasks assessed           Lower Extremity Assessment: LLE deficits/detail   LLE Deficits / Details: decreased strength and ROM - unable to lift LLE againts gravity in supine position     Communication  Communication: No difficulties  Cognition Arousal/Alertness: Awake/alert Behavior During Therapy: WFL for tasks assessed/performed Overall Cognitive Status: Within Functional Limits for tasks assessed                      General Comments General comments (skin integrity, edema, etc.): Reviewed knee precautions, use of footsie roll, and therapeutics  exercises.    Exercises Total Joint Exercises Ankle Circles/Pumps: AROM;Both;10 reps;Supine Quad Sets: AROM;Left;10 reps;Supine      Assessment/Plan    PT Assessment Patient needs continued PT services  PT Diagnosis Difficulty walking;Abnormality of gait;Acute pain   PT Problem List Decreased strength;Decreased range of motion;Decreased activity tolerance;Decreased balance;Decreased mobility;Decreased knowledge of use of DME;Decreased knowledge of precautions;Pain  PT Treatment Interventions DME instruction;Gait training;Stair training;Functional mobility training;Therapeutic activities;Therapeutic exercise;Balance training;Neuromuscular re-education;Patient/family education;Modalities   PT Goals (Current goals can be found in the Care Plan section) Acute Rehab PT Goals Patient Stated Goal: Be able to travel again PT Goal Formulation: With patient Time For Goal Achievement: 01/16/14 Potential to Achieve Goals: Good    Frequency 7X/week   Barriers to discharge        Co-evaluation               End of Session Equipment Utilized During Treatment: Gait belt Activity Tolerance: Patient tolerated treatment well Patient left: in chair;with call bell/phone within reach;with family/visitor present Nurse Communication: Mobility status;Other (comment) (Pt nauseous)         Time: 2876-8115 PT Time Calculation (min): 46 min   Charges:   PT Evaluation $Initial PT Evaluation Tier I: 1 Procedure PT Treatments $Gait Training: 8-22 mins $Therapeutic Activity: 8-22 mins   PT G CodesCamille Bal [place], [place] 01/09/2014, 3:59 PM

## 2014-01-10 ENCOUNTER — Encounter (HOSPITAL_COMMUNITY): Payer: Self-pay | Admitting: Orthopedic Surgery

## 2014-01-10 LAB — CBC
HCT: 36.2 % (ref 36.0–46.0)
Hemoglobin: 11.6 g/dL — ABNORMAL LOW (ref 12.0–15.0)
MCH: 28.4 pg (ref 26.0–34.0)
MCHC: 32 g/dL (ref 30.0–36.0)
MCV: 88.7 fL (ref 78.0–100.0)
Platelets: 260 10*3/uL (ref 150–400)
RBC: 4.08 MIL/uL (ref 3.87–5.11)
RDW: 14.4 % (ref 11.5–15.5)
WBC: 8.8 10*3/uL (ref 4.0–10.5)

## 2014-01-10 NOTE — Progress Notes (Signed)
Physical Therapy Treatment Patient Details Name: Melissa Holloway MRN: [id number] DOB: 1946/03/22 Today's Date: 01/10/2014    History of Present Illness 68 y.o. female s/p left TKA.    PT Comments    Patient doing well. Anticipate DC tomorrow after stair training.   Follow Up Recommendations  Home health PT;Supervision for mobility/OOB     Equipment Recommendations       Recommendations for Other Services       Precautions / Restrictions Precautions Precautions: Knee Precaution Comments: Reviewed precautions Restrictions LLE Weight Bearing: Weight bearing as tolerated    Mobility  Bed Mobility Overal bed mobility: Needs Assistance       Supine to sit: Min assist Sit to supine: Min assist   General bed mobility comments: LLE in and out of bed.   Transfers Overall transfer level: Needs assistance Equipment used: Rolling walker (2 wheeled)   Sit to Stand: Supervision            Ambulation/Gait Ambulation/Gait assistance: Supervision Ambulation Distance (Feet): 180 Feet Assistive device: Rolling walker (2 wheeled)   Gait velocity: decreased   General Gait Details: Cued for sequence and posture   Stairs            Wheelchair Mobility    Modified Rankin (Stroke Patients Only)       Balance                                    Cognition Arousal/Alertness: Awake/alert Behavior During Therapy: WFL for tasks assessed/performed Overall Cognitive Status: Within Functional Limits for tasks assessed                      Exercises Total Joint Exercises Quad Sets: AROM;Left;10 reps Heel Slides: AAROM;Left;10 reps Hip ABduction/ADduction: AAROM;Left;10 reps Straight Leg Raises: AAROM;Left;10 reps    General Comments        Pertinent Vitals/Pain no apparent distress     Home Living                      Prior Function            PT Goals (current goals can now be found in the care plan section) Progress  towards PT goals: Progressing toward goals    Frequency  7X/week    PT Plan Current plan remains appropriate    Co-evaluation             End of Session Equipment Utilized During Treatment: Gait belt   Patient left: with call bell/phone within reach;with family/visitor present;in bed     Time: 1355-1420 PT Time Calculation (min): 25 min  Charges:  $Gait Training: 8-22 mins $Therapeutic Exercise: 8-22 mins                    G Codes:      Tonia Brooms Robinette 01/10/2014, 2:24 PM 01/10/2014 [place] PTA [phone] pager [phone] office

## 2014-01-10 NOTE — Progress Notes (Signed)
Physical Therapy Treatment Patient Details Name: Melissa Holloway MRN: [id number] DOB: 07/19/1946 Today's Date: 01/10/2014    History of Present Illness 68 y.o. female s/p left TKA.    PT Comments    Patient progressing slowly this morning due to lightheadedness and nausea. Will work on longer ambulation this afternoon as patient can tolerate. Anticipate DC sometime tomorrow.   Follow Up Recommendations  Home health PT;Supervision for mobility/OOB     Equipment Recommendations  3in1 (PT)    Recommendations for Other Services       Precautions / Restrictions Precautions Precautions: Knee Restrictions LLE Weight Bearing: Weight bearing as tolerated    Mobility  Bed Mobility Overal bed mobility: Needs Assistance       Supine to sit: Min assist     General bed mobility comments: LLE out of bed and cues for positioning  Transfers Overall transfer level: Needs assistance Equipment used: Rolling walker (2 wheeled)   Sit to Stand: Supervision         General transfer comment: Supervision for safety and cues for hand placement  Ambulation/Gait Ambulation/Gait assistance: Min guard Ambulation Distance (Feet): 30 Feet Assistive device: Rolling walker (2 wheeled)   Gait velocity: decreased   General Gait Details: Patient able to ambulate to door and back however had to sit halfway due to lightheadedness. Once subsided, patient able to walk back to the chair. Cues for gait sequence and positioning of RW   Stairs            Wheelchair Mobility    Modified Rankin (Stroke Patients Only)       Balance                                    Cognition Arousal/Alertness: Awake/alert Behavior During Therapy: WFL for tasks assessed/performed Overall Cognitive Status: Within Functional Limits for tasks assessed                      Exercises Total Joint Exercises Quad Sets: AROM;Left;10 reps Heel Slides: AAROM;Left;10 reps Hip  ABduction/ADduction: AAROM;Left;10 reps Straight Leg Raises: AAROM;Left;5 reps    General Comments        Pertinent Vitals/Pain 7/10 L knee pain. Patient nauseated. RN aware and giving meds.     Home Living                      Prior Function            PT Goals (current goals can now be found in the care plan section) Progress towards PT goals: Progressing toward goals    Frequency  7X/week    PT Plan Current plan remains appropriate    Co-evaluation             End of Session Equipment Utilized During Treatment: Gait belt Activity Tolerance: Other (comment) (Limited by nausea and lightheadedness) Patient left: in chair;with call bell/phone within reach;with family/visitor present     Time: 8811-0315 PT Time Calculation (min): 26 min  Charges:  $Gait Training: 8-22 mins $Therapeutic Exercise: 8-22 mins                    G Codes:      Tonia Brooms Robinette 01/10/2014, 9:08 AM  01/10/2014 [place] PTA [phone] pager [phone] office

## 2014-01-10 NOTE — Progress Notes (Signed)
Patient ID: Melissa Holloway, female   DOB: [birth year], 68 y.o.   MRN: [id number] PATIENT ID: Melissa Holloway  MRN: [id number]  DOB/AGE:  05-13-1946 / 68 y.o.  1 Day Post-Op Procedure(s) (LRB): LEFT TOTAL KNEE ARTHROPLASTY (Left)    PROGRESS NOTE Subjective: Patient is alert, oriented, 1x Nausea, 1x Vomiting, yes passing gas, no Bowel Movement. Taking PO sips. Denies SOB, Chest or Calf Pain. Using Incentive Spirometer, PAS in place. Ambulate WBAT in room yesterday, CPM 0-60 Patient reports pain as 7 on 0-10 scale  .    Objective: Vital signs in last 24 hours: Filed Vitals:   01/09/14 1107 01/09/14 2136 01/10/14 0135 01/10/14 0543  BP: 126/52 109/54 130/48 113/50  Pulse: 66 58 68 61  Temp: 97.5 F (36.4 C) 98.4 F (36.9 C) 98 F (36.7 C) 97.4 F (36.3 C)  TempSrc: Oral Oral Oral Oral  Resp: 14 16 18 18   Height:      Weight:      SpO2: 98% 98% 98% 98%      Intake/Output from previous day: I/O last 3 completed shifts: In: 1689.6 [I.V.:1689.6] Out: 0    Intake/Output this shift: Total I/O In: -  Out: 375 [Drains:375]   LABORATORY DATA: No results found for this basename: WBC, HGB, HCT, PLT, NA, K, CL, CO2, BUN, CREATININE, GLUCOSE, GLUCAP, PT, INR, CALCIUM, 2,  in the last 72 hours  Examination: Neurologically intact ABD soft Neurovascular intact Sensation intact distally Intact pulses distally Dorsiflexion/Plantar flexion intact Incision: no drainage No cellulitis present Compartment soft}  Assessment:   1 Day Post-Op Procedure(s) (LRB): LEFT TOTAL KNEE ARTHROPLASTY (Left) ADDITIONAL DIAGNOSIS:    Plan: PT/OT WBAT, CPM 5/hrs day until ROM 0-90 degrees, then D/C CPM DVT Prophylaxis:  SCDx72hrs, ASA 325 mg BID x 2 weeks DISCHARGE PLAN: Home, when passes PT DISCHARGE NEEDS: HHPT, HHRN, CPM, Walker and 3-in-1 comode seat     Kerin Salen 01/10/2014, 6:21 AM

## 2014-01-10 NOTE — Progress Notes (Signed)
Occupational Therapy Treatment Patient Details Name: Melissa Holloway MRN: [id number] DOB: May 27, 1946 Today's Date: 01/10/2014    History of present illness 68 y.o. female s/p left TKA.   OT comments  Pt seen for acute OT treatment session. Pt progressing well towards acute OT goals. Transfers and ambulation at min guard. Pt c/o nausea during session but able to complete activities. Practiced transfers to shower seat and entering walkin shower. Reviewed techniques and AE for safe completion of ADLs. Educated on walker safety and positioning of operated leg while sitting.  Follow Up Recommendations  No OT follow up;Supervision - Intermittent    Equipment Recommendations  3 in 1 bedside comode    Recommendations for Other Services      Precautions / Restrictions Precautions Precautions: Knee Precaution Comments: Reviewed precautions Restrictions LLE Weight Bearing: Weight bearing as tolerated       Mobility Bed Mobility .   Transfers Overall transfer level: Needs assistance Equipment used: Rolling walker (2 wheeled) Transfers: Sit to/from Stand Sit to Stand: Min guard              Balance                                   ADL Overall ADL's : Needs assistance/impaired                         Toilet Transfer: Min guard;Ambulation;RW (3n1 over toilet)   Toileting- Clothing Manipulation and Hygiene: Min guard;Sit to/from stand   Tub/ Shower Transfer: Min guard;Ambulation;Shower seat;Rolling walker   Functional mobility during ADLs: Min guard;Rolling walker General ADL Comments: Educated on shower transfer technique. Reviewed techniques and AE for same completion of ADLs. Educated on rw safety.      Vision                     Perception     Praxis      Cognition   Behavior During Therapy: [facility] for tasks assessed/performed Overall Cognitive Status: Within Functional Limits for tasks assessed                        Extremity/Trunk Assessment               Exercises    Shoulder Instructions       General Comments      Pertinent Vitals/ Pain       5/10 pain. Nausea. Comfort measures.  Home Living                                          Prior Functioning/Environment              Frequency Min 2X/week     Progress Toward Goals  OT Goals(current goals can now be found in the care plan section)  Progress towards OT goals: Progressing toward goals  Acute Rehab OT Goals Patient Stated Goal: not stated OT Goal Formulation: With patient Time For Goal Achievement: 01/16/14 Potential to Achieve Goals: Good ADL Goals Pt Will Perform Lower Body Dressing: with modified independence;sit to/from stand Pt Will Transfer to Toilet: with modified independence;ambulating Pt Will Perform Toileting - Clothing Manipulation and hygiene: with modified independence;sit to/from stand Pt Will Perform Tub/Shower Transfer: Shower transfer;with supervision;ambulating;shower seat;rolling  walker  Plan Discharge plan remains appropriate    Co-evaluation                 End of Session Equipment Utilized During Treatment: Gait belt;Rolling walker   Activity Tolerance Other (comment) (c/o nausea)   Patient Left in chair;with call bell/phone within reach;with family/visitor present   Nurse Communication          Time: 8280-0349 OT Time Calculation (min): 26 min  Charges: OT General Charges $OT Visit: 1 Procedure OT Treatments $Self Care/Home Management : 23-37 mins  Hortencia Pilar 01/10/2014, 3:18 PM

## 2014-01-11 LAB — CBC
HCT: 35.8 % — ABNORMAL LOW (ref 36.0–46.0)
HEMOGLOBIN: 11.6 g/dL — AB (ref 12.0–15.0)
MCH: 28.6 pg (ref 26.0–34.0)
MCHC: 32.4 g/dL (ref 30.0–36.0)
MCV: 88.2 fL (ref 78.0–100.0)
Platelets: 208 10*3/uL (ref 150–400)
RBC: 4.06 MIL/uL (ref 3.87–5.11)
RDW: 14.2 % (ref 11.5–15.5)
WBC: 7.6 10*3/uL (ref 4.0–10.5)

## 2014-01-11 NOTE — Progress Notes (Signed)
PATIENT ID: Melissa Holloway  MRN: [id number]  DOB/AGE:  May 13, 1946 / 68 y.o.  2 Days Post-Op Procedure(s) (LRB): LEFT TOTAL KNEE ARTHROPLASTY (Left)    PROGRESS NOTE Subjective: Patient is alert, oriented, no Nausea, no Vomiting, yes passing gas, no Bowel Movement. Taking PO well. Denies SOB, Chest or Calf Pain. Using Incentive Spirometer, PAS in place. Ambulate WBAT, CPM 0-60 Patient reports pain as 2 on 0-10 scale  .    Objective: Vital signs in last 24 hours: Filed Vitals:   01/10/14 1200 01/10/14 1500 01/10/14 2000 01/10/14 2152  BP:  125/45  142/52  Pulse:  81  85  Temp:  98.1 F (36.7 C)  99.9 F (37.7 C)  TempSrc:      Resp: 16 18 18 18   Height:      Weight:      SpO2: 100% 95% 95% 95%      Intake/Output from previous day: I/O last 3 completed shifts: In: 2220 [P.O.:720; I.V.:1500] Out: 375 [Drains:375]   Intake/Output this shift:     LABORATORY DATA:  Recent Labs  01/10/14 0520 01/11/14 0700  WBC 8.8 7.6  HGB 11.6* 11.6*  HCT 36.2 35.8*  PLT 260 208    Examination: Neurologically intact Neurovascular intact Sensation intact distally Intact pulses distally Dorsiflexion/Plantar flexion intact Incision: scant drainage No cellulitis present Compartment soft}  Assessment:   2 Days Post-Op Procedure(s) (LRB): LEFT TOTAL KNEE ARTHROPLASTY (Left) ADDITIONAL DIAGNOSIS:    Plan: PT/OT WBAT, CPM 5/hrs day until ROM 0-90 degrees, then D/C CPM DVT Prophylaxis:  SCDx72hrs, ASA 325 mg BID x 2 weeks DISCHARGE PLAN: Home, later today when pt passes PT goals DISCHARGE NEEDS: HHPT, HHRN, CPM, Walker and 3-in-1 comode seat     Melissa Holloway R 01/11/2014, 7:49 AM

## 2014-01-11 NOTE — Care Management Note (Signed)
CARE MANAGEMENT NOTE 01/11/2014  Patient:  Melissa Holloway, Melissa Holloway   Account Number:  [id number]  Date Initiated:  01/11/2014  Documentation initiated by:  Ricki Miller  Subjective/Objective Assessment:   68 yr old female s/p left total knee arthroplasty.     Action/Plan:   Case manager spoke with patient concerning home health and DME needs at discharge. Choice offered. Referral called to Lelan Pons, with [facility]. Has family support at discharge.   Anticipated DC Date:  01/11/2014   Anticipated DC Plan:  [place]  CM consult      [facility] Choice  HOME HEALTH  DURABLE MEDICAL EQUIPMENT   Choice offered to / List presented to:  C-1 Patient   DME arranged  3-N-1  CPM      DME agency  [employer]     [place] arranged  HH-2 PT      [place].   Status of service:  Completed, signed off Medicare Important Message given?  NA - LOS <3 / Initial given by admissions (If response is "NO", the following Medicare IM given date fields will be blank) Date Medicare IM given:  01/11/2014 Date Additional Medicare IM given:    Discharge Disposition:  [place]  Per UR Regulation:  Reviewed for med. necessity/level of care/duration of stay

## 2014-01-11 NOTE — Progress Notes (Signed)
Physical Therapy Treatment Patient Details Name: Melissa Holloway MRN: [id number] DOB: March 28, 1946 Today's Date: 01/11/2014    History of Present Illness 68 y.o. female s/p left TKA.    PT Comments    Patient progressing well. ABle to complete stair training this morning. Ready to DC home per PT goal standpoint  Follow Up Recommendations  Home health PT;Supervision for mobility/OOB     Equipment Recommendations  3in1 (PT)    Recommendations for Other Services       Precautions / Restrictions Precautions Precautions: Knee Restrictions Weight Bearing Restrictions: Yes LLE Weight Bearing: Weight bearing as tolerated    Mobility  Bed Mobility                  Transfers Overall transfer level: Modified independent                  Ambulation/Gait Ambulation/Gait assistance: Supervision Ambulation Distance (Feet): 250 Feet Assistive device: Rolling walker (2 wheeled) Gait Pattern/deviations: Step-through pattern;Decreased stride length     General Gait Details: starting to ambulate with step through pattern. cues for posture   Stairs Stairs: Yes Stairs assistance: Min assist Stair Management: Step to pattern;Backwards;With walker;No rails Number of Stairs: 2 General stair comments: Patient practiced x2 with cues for sequency and technique.   Wheelchair Mobility    Modified Rankin (Stroke Patients Only)       Balance                                    Cognition Arousal/Alertness: Awake/alert Behavior During Therapy: WFL for tasks assessed/performed Overall Cognitive Status: Within Functional Limits for tasks assessed                      Exercises Total Joint Exercises Quad Sets: AROM;Left;15 reps Heel Slides: AAROM;Left;15 reps Hip ABduction/ADduction: AAROM;Left;15 reps Straight Leg Raises: AAROM;Left;15 reps Long Arc Quad: AAROM;Left;15 reps    General Comments        Pertinent Vitals/Pain no apparent  distress     Home Living                      Prior Function            PT Goals (current goals can now be found in the care plan section) Progress towards PT goals: Progressing toward goals    Frequency  7X/week    PT Plan Current plan remains appropriate    Co-evaluation             End of Session Equipment Utilized During Treatment: Gait belt Activity Tolerance: Patient tolerated treatment well Patient left: in chair;with call bell/phone within reach;with family/visitor present     Time: 9381-8299 PT Time Calculation (min): 31 min  Charges:  $Gait Training: 8-22 mins $Therapeutic Exercise: 8-22 mins                    G Codes:      [name] 01/11/2014, 10:27 AM  01/11/2014 [name] PTA [phone] pager [phone] office

## 2014-01-11 NOTE — Discharge Summary (Signed)
Patient ID: Melissa Holloway MRN: [id number] DOB/AGE: 09-23-1945 68 y.o.  Admit date: 01/09/2014 Discharge date: 01/11/2014  Admission Diagnoses:  Active Problems:   Arthritis of left knee   Discharge Diagnoses:  Same  Past Medical History  Diagnosis Date  . Esophageal hernia     s/p    Surgeries: Procedure(s): LEFT TOTAL KNEE ARTHROPLASTY on 01/09/2014   Consultants:    Discharged Condition: Improved  Hospital Course: Melissa Holloway is an 68 y.o. female who was admitted 01/09/2014 for operative treatment of<principal problem not specified>. Patient has severe unremitting pain that affects sleep, daily activities, and work/hobbies. After pre-op clearance the patient was taken to the operating room on 01/09/2014 and underwent  Procedure(s): LEFT TOTAL KNEE ARTHROPLASTY.    Patient was given perioperative antibiotics: Anti-infectives   Start     Dose/Rate Route Frequency Ordered Stop   01/09/14 0810  cefUROXime (ZINACEF) injection  Status:  Discontinued       As needed 01/09/14 0810 01/09/14 0921   01/09/14 0600  ceFAZolin (ANCEF) IVPB 2 g/50 mL premix     2 g 100 mL/hr over 30 Minutes Intravenous On call to O.R. 01/08/14 1402 01/09/14 0745       Patient was given sequential compression devices, early ambulation, and chemoprophylaxis to prevent DVT.  Patient benefited maximally from hospital stay and there were no complications.    Recent vital signs: Patient Vitals for the past 24 hrs:  BP Temp Pulse Resp SpO2  01/10/14 2152 142/52 mmHg 99.9 F (37.7 C) 85 18 95 %  01/10/14 2000 - - - 18 95 %  01/10/14 1500 125/45 mmHg 98.1 F (36.7 C) 81 18 95 %  01/10/14 1200 - - - 16 100 %  01/10/14 0800 - - - 18 93 %     Recent laboratory studies:  Recent Labs  01/10/14 0520 01/11/14 0700  WBC 8.8 7.6  HGB 11.6* 11.6*  HCT 36.2 35.8*  PLT 260 208     Discharge Medications:     Medication List    STOP taking these medications       aspirin 81 MG tablet  Replaced by:   aspirin EC 325 MG tablet      TAKE these medications       aspirin EC 325 MG tablet  Take 1 tablet (325 mg total) by mouth 2 (two) times daily.     atorvastatin 20 MG tablet  Commonly known as:  LIPITOR  Take 20 mg by mouth daily.     loratadine-pseudoephedrine 10-240 MG per 24 hr tablet  Commonly known as:  CLARITIN-D 24-hour  Take 1 tablet by mouth daily.     methocarbamol 500 MG tablet  Commonly known as:  ROBAXIN  Take 1 tablet (500 mg total) by mouth 2 (two) times daily with a meal.     oxyCODONE-acetaminophen 5-325 MG per tablet  Commonly known as:  ROXICET  Take 1 tablet by mouth every 4 (four) hours as needed.     SYSTANE BALANCE OP  Place 1 drop into both eyes every morning.        Diagnostic Studies: Dg Chest 2 View  01/03/2014   CLINICAL DATA:  Total knee arthroplasty, history of esophageal hernia  EXAM: CHEST  2 VIEW  COMPARISON:  04/29/2010  FINDINGS: Mildly elevated right diaphragm, stable. The heart size and vascular pattern are normal. Lungs are clear.  IMPRESSION: No acute abnormalities   Electronically Signed   By: Elodia Florence.D.  On: 01/03/2014 11:07    Disposition:       Discharge Instructions   CPM    Complete by:  As directed   Continuous passive motion machine (CPM):      Use the CPM from 0 to 60  for 5 hours per day.      You may increase by 10 degrees per day.  You may break it up into 2 or 3 sessions per day.      Use CPM for 2 weeks or until you are told to stop.     Call MD / Call 911    Complete by:  As directed   If you experience chest pain or shortness of breath, CALL 911 and be transported to the hospital emergency room.  If you develope a fever above 101 F, pus (white drainage) or increased drainage or redness at the wound, or calf pain, call your surgeon's office.     Change dressing    Complete by:  As directed   Change dressing on 5, then change the dressing daily with sterile 4 x 4 inch gauze dressing and apply TED hose.   You may clean the incision with alcohol prior to redressing.     Constipation Prevention    Complete by:  As directed   Drink plenty of fluids.  Prune juice may be helpful.  You may use a stool softener, such as Colace (over the counter) 100 mg twice a day.  Use MiraLax (over the counter) for constipation as needed.     Diet - low sodium heart healthy    Complete by:  As directed      Discharge instructions    Complete by:  As directed   Follow up in office with Dr. Mayer Camel in 2 weeks.     Driving restrictions    Complete by:  As directed   No driving for 2 weeks     Increase activity slowly as tolerated    Complete by:  As directed      Patient may shower    Complete by:  As directed   You may shower without a dressing once there is no drainage.  Do not wash over the wound.  If drainage remains, cover wound with plastic wrap and then shower.           Follow-up Information   Follow up with Kerin Salen, MD In 2 weeks.   Specialty:  Orthopedic Surgery   Contact information:   [place] [id number] [phone]        Signed: Theodosia Quay 01/11/2014, 7:53 AM

## 2014-02-09 ENCOUNTER — Encounter (HOSPITAL_COMMUNITY): Payer: Self-pay | Admitting: Orthopedic Surgery

## 2014-02-09 NOTE — OR Nursing (Signed)
Late entry on 02-09-2014 by Etheleen Mayhew, RN to add surgery end time.

## 2014-02-17 NOTE — OR Nursing (Signed)
Late entry for type, subtype and infection under procedural recordings 

## 2014-06-26 ENCOUNTER — Other Ambulatory Visit: Payer: Self-pay

## 2014-06-26 DIAGNOSIS — Z1231 Encounter for screening mammogram for malignant neoplasm of breast: Secondary | ICD-10-CM

## 2014-07-31 ENCOUNTER — Ambulatory Visit: Payer: Medicare Other

## 2014-08-10 ENCOUNTER — Ambulatory Visit
Admission: RE | Admit: 2014-08-10 | Discharge: 2014-08-10 | Disposition: A | Discharge status: Home / Self Care | Payer: Medicare Other | Source: Ambulatory Visit

## 2014-08-10 DIAGNOSIS — Z1231 Encounter for screening mammogram for malignant neoplasm of breast: Secondary | ICD-10-CM

## 2014-08-14 ENCOUNTER — Other Ambulatory Visit: Payer: Self-pay | Admitting: Family Medicine

## 2014-08-14 DIAGNOSIS — R928 Other abnormal and inconclusive findings on diagnostic imaging of breast: Secondary | ICD-10-CM

## 2014-08-16 ENCOUNTER — Ambulatory Visit
Admission: RE | Admit: 2014-08-16 | Discharge: 2014-08-16 | Disposition: A | Discharge status: Home / Self Care | Payer: Medicare Other | Source: Ambulatory Visit | Attending: Family Medicine | Admitting: Family Medicine

## 2014-08-16 ENCOUNTER — Other Ambulatory Visit: Payer: Self-pay | Admitting: Family Medicine

## 2014-08-16 DIAGNOSIS — R928 Other abnormal and inconclusive findings on diagnostic imaging of breast: Secondary | ICD-10-CM

## 2014-08-23 DIAGNOSIS — C50912 Malignant neoplasm of unspecified site of left female breast: Secondary | ICD-10-CM

## 2014-08-23 HISTORY — DX: Malignant neoplasm of unspecified site of left female breast: C50.912

## 2014-08-24 ENCOUNTER — Other Ambulatory Visit: Payer: Self-pay | Admitting: Family Medicine

## 2014-08-24 DIAGNOSIS — R928 Other abnormal and inconclusive findings on diagnostic imaging of breast: Secondary | ICD-10-CM

## 2014-08-31 ENCOUNTER — Ambulatory Visit
Admission: RE | Admit: 2014-08-31 | Discharge: 2014-08-31 | Disposition: A | Discharge status: Home / Self Care | Payer: Medicare Other | Source: Ambulatory Visit | Attending: Family Medicine | Admitting: Family Medicine

## 2014-08-31 DIAGNOSIS — R928 Other abnormal and inconclusive findings on diagnostic imaging of breast: Secondary | ICD-10-CM

## 2014-09-01 ENCOUNTER — Other Ambulatory Visit: Payer: Self-pay | Admitting: Family Medicine

## 2014-09-01 DIAGNOSIS — C50912 Malignant neoplasm of unspecified site of left female breast: Secondary | ICD-10-CM

## 2014-09-07 ENCOUNTER — Other Ambulatory Visit: Payer: Self-pay | Admitting: Family Medicine

## 2014-09-07 DIAGNOSIS — C50912 Malignant neoplasm of unspecified site of left female breast: Secondary | ICD-10-CM

## 2014-09-08 ENCOUNTER — Ambulatory Visit
Admission: RE | Admit: 2014-09-08 | Discharge: 2014-09-08 | Disposition: A | Discharge status: Home / Self Care | Payer: Medicare Other | Source: Ambulatory Visit | Attending: Family Medicine | Admitting: Family Medicine

## 2014-09-08 DIAGNOSIS — C50912 Malignant neoplasm of unspecified site of left female breast: Secondary | ICD-10-CM

## 2014-09-08 MED ORDER — GADOBENATE DIMEGLUMINE 529 MG/ML IV SOLN
17.0000 mL | Freq: Once | INTRAVENOUS | Status: AC | PRN
  Administered 2014-09-08: 17 mL via INTRAVENOUS

## 2014-09-12 ENCOUNTER — Other Ambulatory Visit: Payer: Self-pay | Admitting: Family Medicine

## 2014-09-12 DIAGNOSIS — R928 Other abnormal and inconclusive findings on diagnostic imaging of breast: Secondary | ICD-10-CM

## 2014-09-14 ENCOUNTER — Ambulatory Visit
Admission: RE | Admit: 2014-09-14 | Discharge: 2014-09-14 | Disposition: A | Discharge status: Home / Self Care | Payer: Medicare Other | Source: Ambulatory Visit | Attending: Family Medicine | Admitting: Family Medicine

## 2014-09-14 ENCOUNTER — Other Ambulatory Visit: Payer: Self-pay | Admitting: Family Medicine

## 2014-09-14 ENCOUNTER — Telehealth: Payer: Self-pay | Admitting: *Deleted

## 2014-09-14 DIAGNOSIS — R928 Other abnormal and inconclusive findings on diagnostic imaging of breast: Secondary | ICD-10-CM

## 2014-09-14 NOTE — Telephone Encounter (Signed)
Received referral from [facility].  Called pt and confirmed 09/28/14 genetic appt and 10/10/14 med onc appt.  Mailed before appt letter, calendar, welcoming packet & intake form to pt.  Emailed Engineer, civil (consulting) at [employer] to make her aware.

## 2014-09-18 ENCOUNTER — Encounter: Payer: Self-pay | Admitting: *Deleted

## 2014-09-18 NOTE — Progress Notes (Signed)
Completed chart.  Added to spreadsheet.  Placed chart in Dr. Virgie Dad box.  Took copy of office notes to HIM to scan.

## 2014-09-27 ENCOUNTER — Ambulatory Visit
Admission: RE | Admit: 2014-09-27 | Discharge: 2014-09-27 | Disposition: A | Discharge status: Home / Self Care | Payer: Medicare Other | Source: Ambulatory Visit | Attending: Family Medicine | Admitting: Family Medicine

## 2014-09-27 ENCOUNTER — Other Ambulatory Visit: Payer: Medicare Other

## 2014-09-27 DIAGNOSIS — R928 Other abnormal and inconclusive findings on diagnostic imaging of breast: Secondary | ICD-10-CM

## 2014-09-27 MED ORDER — GADOBENATE DIMEGLUMINE 529 MG/ML IV SOLN: 17.0000 mL | Freq: Once | INTRAVENOUS | Status: AC | PRN

## 2014-09-28 ENCOUNTER — Ambulatory Visit (HOSPITAL_BASED_OUTPATIENT_CLINIC_OR_DEPARTMENT_OTHER): Payer: Medicare Other | Admitting: Genetic Counselor

## 2014-09-28 ENCOUNTER — Other Ambulatory Visit: Payer: Medicare Other

## 2014-09-28 ENCOUNTER — Encounter: Payer: Self-pay | Admitting: Genetic Counselor

## 2014-09-28 DIAGNOSIS — Z809 Family history of malignant neoplasm, unspecified: Secondary | ICD-10-CM

## 2014-09-28 DIAGNOSIS — C50912 Malignant neoplasm of unspecified site of left female breast: Secondary | ICD-10-CM

## 2014-09-28 DIAGNOSIS — Z315 Encounter for genetic counseling: Secondary | ICD-10-CM

## 2014-09-28 DIAGNOSIS — C50212 Malignant neoplasm of upper-inner quadrant of left female breast: Secondary | ICD-10-CM

## 2014-09-28 DIAGNOSIS — Z803 Family history of malignant neoplasm of breast: Secondary | ICD-10-CM | POA: Insufficient documentation

## 2014-09-28 DIAGNOSIS — Z170 Estrogen receptor positive status [ER+]: Secondary | ICD-10-CM

## 2014-09-28 NOTE — Progress Notes (Signed)
Patient Name: Melissa Holloway Patient Age: 69 y.o. Encounter Date: 09/28/2014  Referring Physician: Fanny Skates, MD  Primary Care Provider: Lilian Coma, MD   Ms. Melissa Holloway, a 69 y.o. female, is being seen at the [facility] due to a personal and family history of breast cancer. She presents to clinic today with her husband, Melissa Holloway, to discuss the possibility of a hereditary predisposition to cancer and discuss whether genetic testing is warranted.  HISTORY OF PRESENT ILLNESS: Melissa Holloway was diagnosed with left breast cancer (IDC/DCIS) in 08/2014 at the age of 45. The breast tumor was ER positive, PR positive, and HER2 negative. She stated she had a left breast biopsy yesterday of a suspicious area and is awaiting results. She stated that genetic testing is not being used to guide surgical management.   She has no other history of cancer. She stated that she had a colonoscopy in 2014 which was negative for polyps.  Past Medical History  Diagnosis Date  . Esophageal hernia     s/p  . Breast cancer, left breast   . Family history of breast cancer     Past Surgical History  Procedure Laterality Date  . Esophageal hernia  10  . Tonsillectomy    . Total knee arthroplasty Left 01/09/2014    Procedure: LEFT TOTAL KNEE ARTHROPLASTY;  Surgeon: Kerin Salen, MD;  Location: [facility];  Service: Orthopedics;  Laterality: Left;    History   Social History  . Marital Status: Married    Spouse Name: N/A  . Number of Children: N/A  . Years of Education: N/A   Social History Main Topics  . Smoking status: Never Smoker   . Smokeless tobacco: Not on file  . Alcohol Use: No  . Drug Use: No  . Sexual Activity: Not on file   Other Topics Concern  . Not on file   Social History Narrative     FAMILY HISTORY:   During the visit, a 4-generation pedigree was obtained. Family tree will be sent for scanning and will be in EPIC under the Media tab.  Significant diagnoses include  the following:  Family History  Problem Relation Age of Onset  . Breast cancer Mother 77    TAH/BSO; deceased 49  . Prostate cancer Brother 79    deceased 65  . Breast cancer Maternal Aunt 70    deceased 25  . Breast cancer Paternal Aunt 58    Currently 101  . Cancer Paternal Grandmother     unknown type; deceased 51s    Additionally, Melissa Holloway has one son (age 55). Two of her 3 sisters are deceased (ages 37 and 34) and she has 2 living brothers. In addition to her one maternal aunt with breast cancer, she has 3 other maternal aunts and 4 maternal uncles who are all cancer-free, as are all of maternal cousins and maternal grandparents. Her father died at 2, cancer-free. Of his 4 sisters and one brother, only one sister is living (age 31) and she reportedly had breast cancer in her 66s. This aunt has 8 daughters who are all cancer-free.  Melissa Holloway ancestry is [place] and [place]. There is no known Jewish ancestry and no consanguinity.  ASSESSMENT AND PLAN: Melissa Holloway is a 69 y.o. female with a personal and family history of breast cancer. Even though her mother and a paternal aunt were young at breast cancer diagnosis, Melissa Holloway does not have a high likelihood of having a hereditary  predisposition to cancer given her own age at diagnosis. She does, however, meet Medicare's criteria for testing and would like the information for reassurance and to provide to her family. We reviewed the characteristics, features and inheritance patterns of hereditary cancer syndromes. We also discussed genetic testing, including the process of testing, insurance coverage and implications of results. A negative result will be reassuring.  Melissa Holloway wished to pursue genetic testing and a blood sample will be sent to [facility] for analysis of the 17 genes on the [facility] gene panel (ATM, BARD1, BRCA1, BRCA2, BRIP1, CDH1, CHEK2, MRE11A, MUTYH, NBN, NF1, PALB2, PTEN, RAD50, RAD51C, RAD51D, and TP53). We  discussed the implications of a positive, negative and/ or Variant of Uncertain Significance (VUS) result. Results should be available in approximately 4-5 weeks, at which point we will contact her and address implications for her as well as address genetic testing for at-risk family members, if needed.    We encouraged Melissa Holloway to remain in contact with [facility] annually so that we can update the family history and inform her of any changes in cancer genetics and testing that may be of benefit for this family. Ms.  Holloway questions were answered to her satisfaction today.   Thank you for the referral and allowing [place] to share in the care of your patient.   The patient was seen for a total of 30 minutes, greater than 50% of which was spent face-to-face counseling. This patient was discussed with the overseeing provider who agrees with the above.   Steele Berg, MS, [place] Certified Genetic Counselor phone: [phone] [email or web address]

## 2014-10-05 ENCOUNTER — Other Ambulatory Visit (INDEPENDENT_AMBULATORY_CARE_PROVIDER_SITE_OTHER): Payer: Self-pay | Admitting: General Surgery

## 2014-10-05 DIAGNOSIS — C50212 Malignant neoplasm of upper-inner quadrant of left female breast: Secondary | ICD-10-CM

## 2014-10-09 ENCOUNTER — Other Ambulatory Visit (INDEPENDENT_AMBULATORY_CARE_PROVIDER_SITE_OTHER): Payer: Self-pay | Admitting: General Surgery

## 2014-10-09 ENCOUNTER — Other Ambulatory Visit: Payer: Self-pay | Admitting: *Deleted

## 2014-10-09 DIAGNOSIS — C50212 Malignant neoplasm of upper-inner quadrant of left female breast: Secondary | ICD-10-CM

## 2014-10-09 DIAGNOSIS — C50912 Malignant neoplasm of unspecified site of left female breast: Secondary | ICD-10-CM

## 2014-10-10 ENCOUNTER — Other Ambulatory Visit (HOSPITAL_BASED_OUTPATIENT_CLINIC_OR_DEPARTMENT_OTHER): Payer: Medicare Other

## 2014-10-10 ENCOUNTER — Ambulatory Visit (HOSPITAL_BASED_OUTPATIENT_CLINIC_OR_DEPARTMENT_OTHER): Payer: Medicare Other | Admitting: Oncology

## 2014-10-10 ENCOUNTER — Encounter: Payer: Self-pay | Admitting: Oncology

## 2014-10-10 ENCOUNTER — Ambulatory Visit: Payer: Medicare Other

## 2014-10-10 VITALS — BP 150/72 | HR 74 | Temp 98.3°F | Resp 18 | Ht 64.0 in | Wt 192.7 lb

## 2014-10-10 DIAGNOSIS — C50212 Malignant neoplasm of upper-inner quadrant of left female breast: Secondary | ICD-10-CM

## 2014-10-10 DIAGNOSIS — Z170 Estrogen receptor positive status [ER+]: Secondary | ICD-10-CM

## 2014-10-10 DIAGNOSIS — M1712 Unilateral primary osteoarthritis, left knee: Secondary | ICD-10-CM

## 2014-10-10 DIAGNOSIS — C50912 Malignant neoplasm of unspecified site of left female breast: Secondary | ICD-10-CM

## 2014-10-10 DIAGNOSIS — Z803 Family history of malignant neoplasm of breast: Secondary | ICD-10-CM

## 2014-10-10 LAB — COMPREHENSIVE METABOLIC PANEL (CC13)
ALBUMIN: 4 g/dL (ref 3.5–5.0)
ALT: 25 U/L (ref 0–55)
ANION GAP: 11 meq/L (ref 3–11)
AST: 25 U/L (ref 5–34)
Alkaline Phosphatase: 103 U/L (ref 40–150)
BUN: 16.2 mg/dL (ref 7.0–26.0)
CHLORIDE: 104 meq/L (ref 98–109)
CO2: 28 meq/L (ref 22–29)
Calcium: 10 mg/dL (ref 8.4–10.4)
Creatinine: 0.7 mg/dL (ref 0.6–1.1)
EGFR: 88 mL/min/{1.73_m2} — AB (ref >90)
Glucose: 108 mg/dl (ref 70–140)
Potassium: 4 mEq/L (ref 3.5–5.1)
SODIUM: 143 meq/L (ref 136–145)
Total Bilirubin: 0.53 mg/dL (ref 0.20–1.20)
Total Protein: 7.2 g/dL (ref 6.4–8.3)

## 2014-10-10 LAB — CBC WITH DIFFERENTIAL/PLATELET
BASO%: 0.8 % (ref 0.0–2.0)
BASOS ABS: 0.1 10*3/uL (ref 0.0–0.1)
EOS%: 1.8 % (ref 0.0–7.0)
Eosinophils Absolute: 0.1 10*3/uL (ref 0.0–0.5)
HEMATOCRIT: 43.9 % (ref 34.8–46.6)
HEMOGLOBIN: 14.4 g/dL (ref 11.6–15.9)
LYMPH%: 31.8 % (ref 14.0–49.7)
MCH: 28.7 pg (ref 25.1–34.0)
MCHC: 32.8 g/dL (ref 31.5–36.0)
MCV: 87.5 fL (ref 79.5–101.0)
MONO#: 0.4 10*3/uL (ref 0.1–0.9)
MONO%: 6 % (ref 0.0–14.0)
NEUT#: 3.7 10*3/uL (ref 1.5–6.5)
NEUT%: 59.6 % (ref 38.4–76.8)
PLATELETS: 262 10*3/uL (ref 145–400)
RBC: 5.02 10*6/uL (ref 3.70–5.45)
RDW: 14.4 % (ref 11.2–14.5)
WBC: 6.2 10*3/uL (ref 3.9–10.3)
lymph#: 2 10*3/uL (ref 0.9–3.3)

## 2014-10-10 NOTE — Progress Notes (Signed)
Checked in new pt with no financial concerns prior to seeing the dr.  Pt has my card for any billing questions or concerns. ° °

## 2014-10-10 NOTE — Addendum Note (Signed)
Addended by: Chauncey Cruel on: 10/10/2014 06:04 PM   Modules accepted: Orders

## 2014-10-10 NOTE — Progress Notes (Signed)
[place]  [phone] [phone]     ID: Melissa Holloway DOB: 01/28/1946  MR#: [id number]  GQB#:[id number]  Patient Care Team: Jonathon Jordan, MD as PCP - General (Family Medicine) PCP: Lilian Coma, MD GYN: SU: Fanny Skates MD OTHER MD: Frederik Pear MD, Lavonna Monarch MD  CHIEF COMPLAINT: early-stage estrogen receptor positive breast cancer  CURRENT TREATMENT: awaiting definitive surgery   BREAST CANCER HISTORY: Melissa Holloway had routine screening mammography with tomography [facility]. This showed a possible mass in the left breast. On 08/16/2014 left diagnostic mammography and ultrasonography found the breast density to be category B. In the upper inner quadrant of the left breast there was a 7 mm spiculated mass which was not palpable by exam. Ultrasound confirmed a 5 mm irregular to her than wide hypoechoic mass in the area in question. There was no left axillary adenopathy.  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  On 09/08/2014 the patient underwent bilateral breast MRI. This confirmed the biopsy-proven malignancy in the upper inner left breast and measured it at 0.8 cm. There was also an irregular enhancing mass in the lower outer left breast measuring 1 cm maximally. Also in the right breast there was an irregular enhancing mass in the lower outer quadrant measuring 0.6 cm. There was no abnormal adenopathy in either axilla and no internal mammary adenopathy. There were incidental cysts noted in the liver, previously imaged by ultrasound in January 2014.  Ultrasound of both breasts on 09/14/2014 failed to identify the additionalsuspicious areas  seen on MRI.accordingly the patient proceeded to bilateral MRI guided biopsies on 09/27/2014. In the right breast lower outer quadrant there was a fibroadenoma with no atypia or malignancy. In the left breast central biopsy there was atypical ductal and atypical lobular hyperplasia.  Her subsequent history is as detailed below  INTERVAL HISTORY: Melissa Holloway was evaluated in the breast clinic 10/10/2014. Her case was also discussed in the multidisciplinary breast cancer conference 09/13/2014. At that time the MRI guided biopsy results were not yet available. It was felt that the patient might be candidate for the double lumpectomy trial on the left. Given the patient's family history a genetics counseling appointment was also initiated at that time.  REVIEW OF SYSTEMS: There were no specific symptoms leading to the original mammogram, which was routinely scheduled. The patient denies unusual headaches, visual changes, nausea, vomiting, stiff neck, dizziness, or gait imbalance. There has been no cough, phlegm production, or pleurisy, no chest pain or pressure, and no change in bowel or bladder habits. The patient denies fever, rash, bleeding, unexplained fatigue or unexplained weight loss. A detailed review of systems was otherwise entirely negative.  PAST MEDICAL HISTORY: Past Medical History  Diagnosis Date  . Esophageal hernia     s/p  . Breast cancer, left breast   . Family history of breast cancer     PAST SURGICAL HISTORY: Past Surgical History  Procedure Laterality Date  . Esophageal hernia  10  . Tonsillectomy    . Total knee arthroplasty Left 01/09/2014    Procedure: LEFT TOTAL KNEE ARTHROPLASTY;  Surgeon: Kerin Salen, MD;  Location: [facility];  Service: Orthopedics;  Laterality: Left;    FAMILY HISTORY  Family History  Problem Relation Age of Onset  . Breast cancer Mother 44    TAH/BSO; deceased 70  . Prostate cancer Brother 17    deceased 40  . Breast cancer Maternal Aunt 70     deceased 99  . Breast cancer Paternal Aunt 79    Currently 101  . Cancer Paternal Grandmother     unknown type; deceased 33s  the patient's father died at the age of 89 following a fall. The patient's mother was diagnosed with breast cancer around age 56. She died with Alzheimer's disease around age 12. The patient had 4 brothers, 3 sisters. One brother was diagnosed with prostate cancer at the age of 56. In addition a paternal aunt was diagnosed with breast cancer at age 45. She is not 68 years old. She has 6 daughters mostly in their 12s none of whom have had breast cancer. There is no history of ovarian cancer in the family.  GYNECOLOGIC HISTORY:  No LMP recorded. Patient is postmenopausal. Menarche age 18, first live birth age 71. The patient is GX P1. The patient went through menopause around 2008. She did not take hormone replacement. She did take oral contraceptives remotely for more than a year with no complications  SOCIAL HISTORY:  Melissa Holloway is retired from [employer]. Her husband Melissa Holloway") is a retired Administrator. Their son [name] lives in [place] and works in Press photographer. The patient has one 63-monthold granddaughter and a grandson due 01/11/2015.    ADVANCED DIRECTIVES: not in place   HEALTH MAINTENANCE: History  Substance Use Topics  . Smoking status: Never Smoker   . Smokeless tobacco: Not on file  . Alcohol Use: No     Colonoscopy:  PAP:  Bone density:  Lipid panel:  No Known Allergies  Current Outpatient Prescriptions  Medication Sig Dispense Refill  . aspirin EC 81 MG tablet Take 81 mg by mouth daily.    .Marland Kitchenatorvastatin (LIPITOR) 20 MG tablet Take 20 mg by mouth daily.    . vitamin C (ASCORBIC ACID) 500 MG tablet Take 500 mg by mouth daily.    .Marland KitchenPropylene Glycol (SYSTANE BALANCE OP) Place 1 drop into both eyes every morning.     No current facility-administered medications for this visit.    OBJECTIVE: middle-aged white woman who appears well Filed Vitals:     10/10/14 1635  BP: 150/72  Pulse: 74  Temp: 98.3 F (36.8 C)  Resp: 18     Body mass index is 33.06 kg/(m^2).    ECOG FS:0 - Asymptomatic  Ocular: Sclerae unicteric, pupils equal, round and reactive to light Ear-nose-throat: Oropharynx clear, dentition fair Lymphatic: No cervical or supraclavicular adenopathy Lungs no rales or rhonchi, good excursion bilaterally Heart regular rate and rhythm, no murmur appreciated Abd soft, nontender, positive bowel sounds MSK no focal spinal tenderness, no joint edema Neuro: non-focal, well-oriented, appropriate affect Breasts: the right breast is status post recent biopsy. I do not find any skin or nipple changes of concern. There is no palpable mass. The right axilla is benign. The left breast is also status post recent biopsy. There is a minimal ecchymosis. I do not palpate any masses. There are no skin or nipple changes of concern. The left axilla is benign.   LAB RESULTS:  CMP     Component Value Date/Time   NA 143 10/10/2014 1554   NA 141 01/03/2014 1030   K 4.0 10/10/2014 1554   K 3.9 01/03/2014 1030   CL  103 01/03/2014 1030   CO2 28 10/10/2014 1554   CO2 26 01/03/2014 1030   GLUCOSE 108 10/10/2014 1554   GLUCOSE 95 01/03/2014 1030   BUN 16.2 10/10/2014 1554   BUN 13 01/03/2014 1030   CREATININE 0.7 10/10/2014 1554   CREATININE 0.62 01/03/2014 1030   CALCIUM 10.0 10/10/2014 1554   CALCIUM 9.6 01/03/2014 1030   PROT 7.2 10/10/2014 1554   PROT 7.4 04/29/2010 0958   ALBUMIN 4.0 10/10/2014 1554   ALBUMIN 4.4 04/29/2010 0958   AST 25 10/10/2014 1554   AST 22 04/29/2010 0958   ALT 25 10/10/2014 1554   ALT 18 04/29/2010 0958   ALKPHOS 103 10/10/2014 1554   ALKPHOS 77 04/29/2010 0958   BILITOT 0.53 10/10/2014 1554   BILITOT 0.5 04/29/2010 0958   GFRNONAA >90 01/03/2014 1030   GFRAA >90 01/03/2014 1030    INo results found for: SPEP, UPEP  Lab Results  Component Value Date   WBC 6.2 10/10/2014   NEUTROABS 3.7 10/10/2014    HGB 14.4 10/10/2014   HCT 43.9 10/10/2014   MCV 87.5 10/10/2014   PLT 262 10/10/2014      Chemistry      Component Value Date/Time   NA 143 10/10/2014 1554   NA 141 01/03/2014 1030   K 4.0 10/10/2014 1554   K 3.9 01/03/2014 1030   CL 103 01/03/2014 1030   CO2 28 10/10/2014 1554   CO2 26 01/03/2014 1030   BUN 16.2 10/10/2014 1554   BUN 13 01/03/2014 1030   CREATININE 0.7 10/10/2014 1554   CREATININE 0.62 01/03/2014 1030      Component Value Date/Time   CALCIUM 10.0 10/10/2014 1554   CALCIUM 9.6 01/03/2014 1030   ALKPHOS 103 10/10/2014 1554   ALKPHOS 77 04/29/2010 0958   AST 25 10/10/2014 1554   AST 22 04/29/2010 0958   ALT 25 10/10/2014 1554   ALT 18 04/29/2010 0958   BILITOT 0.53 10/10/2014 1554   BILITOT 0.5 04/29/2010 0958       No results found for: LABCA2  No components found for: LABCA125  No results for input(s): INR in the last 168 hours.  Urinalysis    Component Value Date/Time   COLORURINE YELLOW 01/03/2014 1042   APPEARANCEUR CLEAR 01/03/2014 1042   LABSPEC 1.012 01/03/2014 1042   PHURINE 6.0 01/03/2014 1042   GLUCOSEU NEGATIVE 01/03/2014 1042   HGBUR NEGATIVE 01/03/2014 1042   BILIRUBINUR NEGATIVE 01/03/2014 1042   KETONESUR NEGATIVE 01/03/2014 1042   PROTEINUR NEGATIVE 01/03/2014 1042   UROBILINOGEN 0.2 01/03/2014 1042   NITRITE NEGATIVE 01/03/2014 1042   LEUKOCYTESUR TRACE* 01/03/2014 1042    STUDIES: Mm Digital Diagnostic Bilat  09/27/2014   CLINICAL DATA:  Post MRI guided biopsy of a suspicious enhancing mass in the lower outer right breast and post MRI guided biopsy of a suspicious enhancing mass in the central left breast.  EXAM: DIAGNOSTIC BILATERAL MAMMOGRAM POST MRI BIOPSY  COMPARISON:  Previous exam(s).  FINDINGS: Mammographic images were obtained following MRI guided biopsy of the bilateral breasts. A dumbbell shaped biopsy marking clip is present in the lower outer right breast in the targeted region of the mass. A dumbbell shaped  biopsy marking clip is present in the central left breast in the targeted region of the mass.  IMPRESSION: Appropriate positioning of dumbbell shaped biopsy marking clips post MRI guided biopsies of a mass in each breast.  Final Assessment: Post Procedure Mammograms for Marker Placement   Electronically Signed   By:  Everlean Alstrom M.D.   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 Recent preoperative MRI demonstrated masslike enhancement in the lower outer quadrant of the left breast, anterior depth, measuring approximately 10 mm and masslike enhancement in the lower outer quadrant of the right breast, posterior depth, measuring approximately 6 mm. 2nd-look ultrasound requested and if corresponding lesions are identified, ultrasound-guided core needle biopsy.  EXAM: ULTRASOUND OF THE BILATERAL BREASTS  COMPARISON:  Previous exams including bilateral breast MRI 09/08/2014.  FINDINGS: On physical exam, there is no palpable abnormality in the lower outer quadrant of either breast.  Targeted right breast ultrasound is performed, showing an oval-shaped horizontally oriented nearly hypoechoic mass with thin internal septations and acoustic enhancement at the 9:30 o'clock position of the right breast approximately 8 cm from the nipple measuring approximately 3 x 2 x 7 mm without internal color Doppler flow. There is an adjacent 3 x 2 x 2 mm circumscribed anechoic mass with acoustic enhancement. This does not correlate with the MRI finding. No suspicious solid mass or abnormal acoustic shadowing was identified in the lower outer quadrant of the right breast.  Targeted left breast ultrasound is performed showing an oval-shaped circumscribed horizontally oriented hypoechoic mass with acoustic enhancement and no internal color  Doppler flow at the 2:30 o'clock position of the left breast approximately 4 cm from the nipple measuring approximately 3 x 3 x 5 mm. This does not correlate with the MRI finding. No suspicious solid mass or abnormal acoustic shadowing was identified in the lower outer quadrant of the left breast.  IMPRESSION: 1. Benign mildly complex cyst in the upper outer quadrant of the right breast with an adjacent simple cyst. 2. Benign mildly complex cyst in the upper outer quadrant of the left breast. 3. No findings in either breast to correlate with the abnormal enhancement on MRI.  RECOMMENDATION: MRI guided biopsy of the abnormal enhancement in both breasts.  The MRI biopsy procedure was discussed with the patient and her questions were answered. At her request, the bilateral breast biopsies have been scheduled for 09/27/2014.  I have discussed the findings and recommendations with the patient. Results were also provided in writing at the conclusion of the visit.  BI-RADS CATEGORY  2: Benign.  However, the abnormal enhancement in both breasts on MRI without sonographic correlate will require MRI guided biopsy and this has been scheduled for 09/27/2014.   Electronically Signed   By: Evangeline Dakin M.D.   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 Recent preoperative MRI demonstrated masslike enhancement in the lower outer quadrant of the left breast, anterior depth, measuring approximately 10 mm and masslike enhancement in the lower outer quadrant of the right breast, posterior depth, measuring approximately 6 mm. 2nd-look ultrasound requested and if corresponding lesions are identified, ultrasound-guided core needle biopsy.  EXAM: ULTRASOUND OF THE BILATERAL BREASTS  COMPARISON:  Previous exams including bilateral breast  MRI 09/08/2014.  FINDINGS: On physical exam, there is no palpable abnormality in the lower outer quadrant of either breast.  Targeted right breast  ultrasound is performed, showing an oval-shaped horizontally oriented nearly hypoechoic mass with thin internal septations and acoustic enhancement at the 9:30 o'clock position of the right breast approximately 8 cm from the nipple measuring approximately 3 x 2 x 7 mm without internal color Doppler flow. There is an adjacent 3 x 2 x 2 mm circumscribed anechoic mass with acoustic enhancement. This does not correlate with the MRI finding. No suspicious solid mass or abnormal acoustic shadowing was identified in the lower outer quadrant of the right breast.  Targeted left breast ultrasound is performed showing an oval-shaped circumscribed horizontally oriented hypoechoic mass with acoustic enhancement and no internal color Doppler flow at the 2:30 o'clock position of the left breast approximately 4 cm from the nipple measuring approximately 3 x 3 x 5 mm. This does not correlate with the MRI finding. No suspicious solid mass or abnormal acoustic shadowing was identified in the lower outer quadrant of the left breast.  IMPRESSION: 1. Benign mildly complex cyst in the upper outer quadrant of the right breast with an adjacent simple cyst. 2. Benign mildly complex cyst in the upper outer quadrant of the left breast. 3. No findings in either breast to correlate with the abnormal enhancement on MRI.  RECOMMENDATION: MRI guided biopsy of the abnormal enhancement in both breasts.  The MRI biopsy procedure was discussed with the patient and her questions were answered. At her request, the bilateral breast biopsies have been scheduled for 09/27/2014.  I have discussed the findings and recommendations with the patient. Results were also provided in writing at the conclusion of the visit.  BI-RADS CATEGORY  2: Benign.  However, the abnormal enhancement in both breasts on MRI without  sonographic correlate will require MRI guided biopsy and this has been scheduled for 09/27/2014.   Electronically Signed   By: Evangeline Dakin M.D.   On: 09/14/2014 09:44   Mr Aundra Millet Breast Bx Johnella Moloney Dev 1st Lesion Image Bx Spec Mr Guide  09/29/2014   ADDENDUM REPORT: 09/29/2014 14:14  ADDENDUM: Patient's pathology has returned. On the left, the biopsy from the central breast reveals atypical ductal hyperplasia and atypical lobular hyperplasia. On the right, the biopsy results reveal a fibroadenoma. Results on both sides are concordant. I contacted this patient with her results. She has an appointment scheduled with Dr. Dalbert Batman on March 3rd. She was told to keep this appointment. If more time is needed, [facility] [place] [facility] well contact her.  Patient has no significant complication following the biopsy and is doing well.   Electronically Signed   By: Lajean Manes M.D.   On: 09/29/2014 14:14   09/29/2014   CLINICAL DATA:  69 year old female with recently diagnosed invasive ductal carcinoma of the upper inner left breast, with an indeterminate enhancing nodule seen in the lower outer right breast and an indeterminate enhancing nodule in the outer left breast on recent MRI.  EXAM: MRI GUIDED CORE NEEDLE BIOPSY OF THE BILATERAL BREASTS  TECHNIQUE: Multiplanar, multisequence MR imaging of the bilateral breasts 17 mL of MultiHance. Was performed both before and after administration of intravenous contrast.  COMPARISON:  Previous exams.  FINDINGS: I met with the patient, and we discussed the procedure of MRI guided biopsy, including risks, benefits, and alternatives. Specifically, we discussed the risks of infection, bleeding, tissue injury, clip migration, and inadequate sampling. Informed, written consent was given. The usual time out protocol was performed immediately prior to the procedure.  Right breast: Using sterile technique, 2% Lidocaine, MRI guidance, and  a 9 gauge vacuum assisted device, biopsy was performed  of the enhancing nodule in the lower outer right breast using a lateral approach. At the conclusion of the procedure, a dumbbell tissue marker clip was deployed into the biopsy cavity. Follow-up 2-view mammogram was performed and dictated separately.  Left breast: Note that the initially seen enhancing mass in the lower outer left breast appeared to resolve and have the appearance of normal tissue on today's exam, with an indeterminate oval enhancing mass seen in the central left breast which was targeted for biopsy instead.  Using sterile technique, 2% Lidocaine, MRI guidance, and a 9 gauge vacuum assisted device, biopsy was performed of the oval enhancing nodule in the central left breast using a lateral approach. At the conclusion of the procedure, a dumbbell tissue marker clip was deployed into the biopsy cavity. Follow-up 2-view mammogram was performed and dictated separately.  IMPRESSION: MRI guided biopsy of bilateral breast masses. No apparent complications.  Electronically Signed: By: Everlean Alstrom M.D. On: 09/27/2014 09:01   Mr Rick Duff Breast Bx Johnella Moloney Dev 1st Lesion Image Bx Spec Mr Guide  09/29/2014   ADDENDUM REPORT: 09/29/2014 14:14  ADDENDUM: Patient's pathology has returned. On the left, the biopsy from the central breast reveals atypical ductal hyperplasia and atypical lobular hyperplasia. On the right, the biopsy results reveal a fibroadenoma. Results on both sides are concordant. I contacted this patient with her results. She has an appointment scheduled with Dr. Dalbert Batman on March 3rd. She was told to keep this appointment. If more time is needed, [facility] [place] [facility] well contact her.  Patient has no significant complication following the biopsy and is doing well.   Electronically Signed   By: Lajean Manes M.D.   On: 09/29/2014 14:14   09/29/2014   CLINICAL DATA:  69 year old female with recently diagnosed invasive ductal carcinoma of the upper inner left breast, with an indeterminate  enhancing nodule seen in the lower outer right breast and an indeterminate enhancing nodule in the outer left breast on recent MRI.  EXAM: MRI GUIDED CORE NEEDLE BIOPSY OF THE BILATERAL BREASTS  TECHNIQUE: Multiplanar, multisequence MR imaging of the bilateral breasts 17 mL of MultiHance. Was performed both before and after administration of intravenous contrast.  COMPARISON:  Previous exams.  FINDINGS: I met with the patient, and we discussed the procedure of MRI guided biopsy, including risks, benefits, and alternatives. Specifically, we discussed the risks of infection, bleeding, tissue injury, clip migration, and inadequate sampling. Informed, written consent was given. The usual time out protocol was performed immediately prior to the procedure.  Right breast: Using sterile technique, 2% Lidocaine, MRI guidance, and a 9 gauge vacuum assisted device, biopsy was performed of the enhancing nodule in the lower outer right breast using a lateral approach. At the conclusion of the procedure, a dumbbell tissue marker clip was deployed into the biopsy cavity. Follow-up 2-view mammogram was performed and dictated separately.  Left breast: Note that the initially seen enhancing mass in the lower outer left breast appeared to resolve and have the appearance of normal tissue on today's exam, with an indeterminate oval enhancing mass seen in the central left breast which was targeted for biopsy instead.  Using sterile technique, 2% Lidocaine, MRI guidance, and a 9 gauge vacuum assisted device, biopsy was performed of the oval enhancing nodule in the central left breast using a lateral approach. At the conclusion of the procedure, a dumbbell tissue marker clip was deployed into the biopsy cavity. Follow-up  2-view mammogram was performed and dictated separately.  IMPRESSION: MRI guided biopsy of bilateral breast masses. No apparent complications.  Electronically Signed: By: Everlean Alstrom M.D. On: 09/27/2014 09:01     ASSESSMENT: 69 y.o. [place] woman status post left breast upper inner quadrant biopsy 08/31/2014 for a clinical T1b N0, stage IA invasive ductal carcinoma, grade 1, strongly estrogen and progesterone receptor positive, HER-2 not amplified, with an MIB-1 of 10%  (1) MRI biopsy of 2 additional lesions showed  (a) in the left central breast, atypical lobular hyperplasia and atypical ductal hyperplasia  (b) in the right breast lower outer quadrant a fibroadenoma   (2) dual lumpectomy planned for 10/19/2014  (3) Oncotype to be sent from the definitive surgical specimen if node negative  (4) radiation to follow  (5) anti-estrogens to follow radiation  PLAN: We spent the better part of today's hour-long appointment discussing the biology of breast cancer in general, and the specifics of the patient's tumor in particular. Priscila has a good understanding of the difference between local and systemic treatments. She understands she will need surgery and that in her case there are 2 separate areas in separate quadrants that will have to be removed. She understands there is no survival advantage between lumpectomies with clear margins followed by radiation as compared to mastectomy.   We also discussed her genetic testing. Her current breast cancer certainly does not lineup as a BRCA associated one. That does not mean of course that she does not have a BRCA mutation. We may not have those results beforethe planned date of surgery. We discussed that extensively today and she feels if the results are not in she will proceed with surgery anyway   If the results come back and show a BRCA mutation she might or might not consider bilateral mastectomies with reconstruction. We did discuss the fact that there is no survival advantage to bilateral mastectomies in BRCA positive patients but they do require intensified surveillance including yearly mammography and breast MRIs  As far as systemic therapy is  concerned, she clearly will benefit from antiestrogen's and today I gave her information on the possible toxicities, side effects and complications of tamoxifen as compared to anastrozole. She understands we would not be starting those until she completes her radiation treatments. She requested an appointment with a radiation physician and I will try to get that scheduled for the first week in April or earlier.  As far as the question of chemotherapy is concerned, I do not expect her to benefit since her tumor types out most consistently as a luminal A. However we will send an Oncotype to confirm that impression. That should be ready before the end of the month and I will call her with those results.  Tahjae has a good understanding of the overall plan. She agrees with it. She knows the goal of treatment in her case is cure. She will call with any problems that may develop before her next visit here.  Chauncey Cruel, MD   10/10/2014 5:40 PM Medical Oncology and Hematology [facility] [address] [place], [place] [address] Tel. [phone]    Fax. [phone]

## 2014-10-11 ENCOUNTER — Encounter (HOSPITAL_BASED_OUTPATIENT_CLINIC_OR_DEPARTMENT_OTHER): Payer: Self-pay | Admitting: *Deleted

## 2014-10-11 ENCOUNTER — Other Ambulatory Visit (INDEPENDENT_AMBULATORY_CARE_PROVIDER_SITE_OTHER): Payer: Self-pay | Admitting: General Surgery

## 2014-10-11 DIAGNOSIS — C50912 Malignant neoplasm of unspecified site of left female breast: Secondary | ICD-10-CM

## 2014-10-11 NOTE — Progress Notes (Signed)
Pt is having a seed and NL done am surgery-then come here-labs already done 10/10/14

## 2014-10-16 ENCOUNTER — Encounter: Payer: Self-pay | Admitting: Genetic Counselor

## 2014-10-16 DIAGNOSIS — Z1379 Encounter for other screening for genetic and chromosomal anomalies: Secondary | ICD-10-CM | POA: Insufficient documentation

## 2014-10-16 NOTE — Progress Notes (Signed)
GENETIC TEST RESULTS  Patient Name: Melissa Holloway Patient Age: 69 y.o. Encounter Date: 10/16/2014  Referring Physician: Fanny Skates, MD   Melissa Holloway was called today to discuss genetic test results. Please see the Genetics note from her visit on 09/28/14 for a detailed discussion of her personal and family history.  GENETIC TESTING: At the time of Melissa Holloway visit, we recommended she pursue genetic testing of multiple genes on the BreastNext gene panel. This test, which included sequencing and deletion/duplication analysis of 17 genes, was performed at [facility]. Testing was normal and did not reveal a mutation in these genes. The genes tested were ATM, BARD1, BRCA1, BRCA2, BRIP1, CDH1, CHEK2, MRE11A, MUTYH, NBN, NF1, PALB2, PTEN, RAD50, RAD51C, RAD51D, and TP53.  We discussed with Melissa Holloway that since the current test is not perfect, it is possible there may be a gene mutation that current testing cannot detect, but that chance is small. We also discussed that it is possible that a different genetic factor, which was not part of this testing or has not yet been discovered, is responsible for the cancer diagnoses in the family. Should Melissa Holloway wish to discuss or pursue this additional testing, we are happy to coordinate this at any time, but do not feel that she is at significant risk of harboring a mutation in a different gene. Given her own age at breast cancer diagnosis, this result is very reassuring.  CANCER SCREENING: This result suggests that Melissa Holloway cancer was most likely not due to an inherited predisposition. Most cancers happen by chance and this negative test, along with details of her family history, suggests that her cancer falls into this category. We, therefore, recommended she continue to follow the cancer screening guidelines provided by her physician.   FAMILY MEMBERS: While these results are reassuring for Melissa Holloway, there may be a mutation in her family that she did  not inherit given that her mother's breast cancer was reportedly at age 47 and a paternal aunt had breast cancer in her 79s. If anyone in her family develops early age-onset cnacer, they should be offered genetic counseling to address genetic testing. Genetic counselors can be located in other cities by visiting the website of the [employer] (ArtistMovie.se) and Field seismologist for a Dietitian by zip code.  Lastly, we discussed with Melissa Holloway that cancer genetics is a rapidly advancing field and it is possible that new genetic tests will be appropriate for her in the future. We encouraged her to remain in contact with [place] on an annual basis so we can update her personal and family histories, and let her know of advances in cancer genetics that may benefit the family. Our contact number was provided. Melissa Holloway questions were answered to her satisfaction today, and she knows she is welcome to call anytime with additional questions.    Steele Berg, MS, [place] Certified Genetic Counselor phone: [phone] [email or web address]

## 2014-10-17 NOTE — H&P (Signed)
[facility]  Location: [facility] Patient #: [id number] DOB: Apr 06, 1946 Married / Language: English / Race: White Female       History of Present Illness  Patient words: Follow up.  The patient is a 69 year old female who presents with breast cancer. She returns for preoperative counseling regarding her left breast cancer, upper inner quadrant. Initially she was evaluated for a 7 mm mass in the left breast, upper inner quadrant, 3 cm from the nipple. Biopsy showed invasive ductal carcinoma, grade 1, receptor positive, HER-2 negative. Subsequent MRI showed a 1 cm mass in the lower outer quadrant of the left breast and a 6 mm mass in the lower outer quadrant of the right breast. there was no adenopathy. A second area in the left breast, lower outer quadrant, was biopsied and shows atypical ductal hyperplasia. This area will need to be excised. A third area in the right breast, lower outer quadrant was biopsied and shows benign fibroadenoma. The patient is comfortable doing nothing about this. She is scheduled to see Dr. Jana Hakim on March 8 for preop medical oncology consultation. She has undergone genetic counseling and blood has been drawn. This will take one month. She does not want to wait for the result to proceed with surgery. She understands the implications. We talked in detail about the options of double lumpectomy, sentinel node biopsy versus mastectomy and SLN biopsy with or without reconstruction. She clearly desires double lumpectomy. She knows she will lose some volume and is very comfortable with that. She knows that we will make all efforts for good cosmesis. We described the procedure of double left breast lumpectomy with radioactive seed localization and left axillary sentinel node biopsy. Discussed all the risks and complications, including but not limited to bleeding, infection, cosmetic deformity, reoperation for positive margins, reoperation for  positive nodes, possible need for chemotherapy, shoulder disability, and other unforeseen problems. She understands all these issues. All of her questions are answered. She agrees with this plan. Addendum: After discussion with Dr. Shon Hale of radiology, we felt that the 2 areas were too close to each other for double seed placement. We have therefore changed our plan to place a radioactive seed in the cancer and the wire in the area of the atypical ductal hyperplasia. The patient will be informed by the [place] office staff.   No Known Drug Allergies02/04/2015  Medication History  Atorvastatin Calcium (20MG Tablet, Oral) Active. Aspirin (325MG Tablet, Oral) Active. Claritin-D 24 Hour (10-240MG Tablet ER 24HR, Oral) Active. Oxycodone-Acetaminophen (5-325MG Tablet, Oral as needed) Active.  Vitals  10/05/2014 10:51 AM Weight: 191.38 lb Height: 64in Body Surface Area: 1.98 m Body Mass Index: 32.85 kg/m Temp.: 98.4F(Oral)  Pulse: 69 (Regular)  BP: 114/62 (Sitting, Left Arm, Standard)    Physical Exam  Head and Neck Note: No adenopathy or mass   Chest and Lung Exam Note: Clear to auscultation bilaterally   Breast Note: Breasts are fairly large. No palpable mass. A little bit of bruising on the left. No axillary adenopathy on either side.   Abdomen Note: Soft. Nontender. No mass. All trocar sites well-healed. No hernias.     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 We both agree that nothing needs to be  done about this. The biopsy of your left breast, lower outer quadrant shows atypical ductal hyperplasia. This area needs to be excised. You have a known invasive cancer in the left  breast, upper inner quadrant. You have an appointment to see Dr. Jana Hakim on March 8. Please keep that appointment You have had blood drawn for genetic testing which will take one month. We have talked about the options of double lumpectomy and sentinel node biopsy versus mastectomy. We both agree that double lumpectomy and sentinel node biopsy is the preferred approach in your case. You understand that you will need whole breast radiation therapy following surgery It is not clear whether you will need chemotherapy or not. Hopefully not. We have discussed the indications, techniques, and numerous risks of this surgery in detail. We have answered all of her questions. We will proceed with scheduling process now   FAMILY HISTORY OF BREAST CANCER IN FEMALE (V16.3  Z80.3) Impression: Mother and maternal aunt.  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 The patient has no desire to have this excised. The skin be followed radiographically.    Edsel Petrin. Dalbert Batman, M.D., [facility] General and Minimally invasive Surgery Breast and Colorectal Surgery Office:   [phone] Pager:   [phone]

## 2014-10-18 ENCOUNTER — Encounter (HOSPITAL_BASED_OUTPATIENT_CLINIC_OR_DEPARTMENT_OTHER): Payer: Self-pay | Admitting: Anesthesiology

## 2014-10-18 ENCOUNTER — Ambulatory Visit
Admission: RE | Admit: 2014-10-18 | Discharge: 2014-10-18 | Disposition: A | Discharge status: Home / Self Care | Payer: Medicare Other | Source: Ambulatory Visit | Attending: General Surgery | Admitting: General Surgery

## 2014-10-18 ENCOUNTER — Ambulatory Visit (HOSPITAL_BASED_OUTPATIENT_CLINIC_OR_DEPARTMENT_OTHER): Payer: Medicare Other | Admitting: Anesthesiology

## 2014-10-18 ENCOUNTER — Encounter (HOSPITAL_BASED_OUTPATIENT_CLINIC_OR_DEPARTMENT_OTHER)
Admission: RE | Disposition: A | Discharge status: Home / Self Care | Payer: Self-pay | Source: Ambulatory Visit | Attending: General Surgery

## 2014-10-18 ENCOUNTER — Ambulatory Visit (HOSPITAL_BASED_OUTPATIENT_CLINIC_OR_DEPARTMENT_OTHER)
Admission: RE | Admit: 2014-10-18 | Discharge: 2014-10-18 | Disposition: A | Discharge status: Home / Self Care | Payer: Medicare Other | Source: Ambulatory Visit | Attending: General Surgery | Admitting: General Surgery

## 2014-10-18 ENCOUNTER — Encounter (HOSPITAL_COMMUNITY)
Admission: RE | Admit: 2014-10-18 | Discharge: 2014-10-18 | Disposition: A | Discharge status: Home / Self Care | Payer: Medicare Other | Source: Ambulatory Visit | Attending: General Surgery | Admitting: General Surgery

## 2014-10-18 DIAGNOSIS — N6092 Unspecified benign mammary dysplasia of left breast: Secondary | ICD-10-CM | POA: Insufficient documentation

## 2014-10-18 DIAGNOSIS — Z803 Family history of malignant neoplasm of breast: Secondary | ICD-10-CM | POA: Diagnosis not present

## 2014-10-18 DIAGNOSIS — Z170 Estrogen receptor positive status [ER+]: Secondary | ICD-10-CM

## 2014-10-18 DIAGNOSIS — C50912 Malignant neoplasm of unspecified site of left female breast: Secondary | ICD-10-CM

## 2014-10-18 DIAGNOSIS — C50212 Malignant neoplasm of upper-inner quadrant of left female breast: Secondary | ICD-10-CM

## 2014-10-18 DIAGNOSIS — M13862 Other specified arthritis, left knee: Secondary | ICD-10-CM | POA: Diagnosis not present

## 2014-10-18 DIAGNOSIS — Z96659 Presence of unspecified artificial knee joint: Secondary | ICD-10-CM | POA: Insufficient documentation

## 2014-10-18 DIAGNOSIS — Z7982 Long term (current) use of aspirin: Secondary | ICD-10-CM | POA: Insufficient documentation

## 2014-10-18 DIAGNOSIS — K449 Diaphragmatic hernia without obstruction or gangrene: Secondary | ICD-10-CM | POA: Insufficient documentation

## 2014-10-18 HISTORY — DX: Unspecified osteoarthritis, unspecified site: M19.90

## 2014-10-18 HISTORY — PX: RADIOACTIVE SEED GUIDED PARTIAL MASTECTOMY WITH AXILLARY SENTINEL LYMPH NODE BIOPSY: SHX6520

## 2014-10-18 HISTORY — DX: Nausea with vomiting, unspecified: R11.2

## 2014-10-18 HISTORY — DX: Other specified postprocedural states: Z98.890

## 2014-10-18 SURGERY — RADIOACTIVE SEED GUIDED PARTIAL MASTECTOMY WITH AXILLARY SENTINEL LYMPH NODE BIOPSY
Anesthesia: General | Site: Breast | Laterality: Left

## 2014-10-18 MED ORDER — HYDROMORPHONE HCL 1 MG/ML IJ SOLN: 0.2500 mg | INTRAMUSCULAR | Status: DC | PRN

## 2014-10-18 MED ORDER — BUPIVACAINE-EPINEPHRINE (PF) 0.5% -1:200000 IJ SOLN
INTRAMUSCULAR | Status: DC | PRN
  Administered 2014-10-18: 18 mL

## 2014-10-18 MED ORDER — OXYCODONE HCL 5 MG PO TABS
ORAL_TABLET | ORAL | Status: AC
  Filled 2014-10-18: qty 1

## 2014-10-18 MED ORDER — OXYCODONE HCL 5 MG/5ML PO SOLN: 5.0000 mg | Freq: Once | ORAL | Status: AC | PRN

## 2014-10-18 MED ORDER — METHYLENE BLUE 1 % INJ SOLN
INTRAMUSCULAR | Status: AC
  Filled 2014-10-18: qty 10

## 2014-10-18 MED ORDER — DEXAMETHASONE SODIUM PHOSPHATE 4 MG/ML IJ SOLN
INTRAMUSCULAR | Status: DC | PRN
  Administered 2014-10-18: 10 mg via INTRAVENOUS

## 2014-10-18 MED ORDER — HYDROCODONE-ACETAMINOPHEN 5-325 MG PO TABS: 1.0000 | ORAL_TABLET | Freq: Four times a day (QID) | ORAL | Status: DC | PRN

## 2014-10-18 MED ORDER — ONDANSETRON HCL 4 MG/2ML IJ SOLN
INTRAMUSCULAR | Status: DC | PRN
  Administered 2014-10-18: 4 mg via INTRAVENOUS

## 2014-10-18 MED ORDER — PROPOFOL 10 MG/ML IV EMUL
INTRAVENOUS | Status: AC
  Filled 2014-10-18: qty 50

## 2014-10-18 MED ORDER — MIDAZOLAM HCL 2 MG/2ML IJ SOLN
1.0000 mg | INTRAMUSCULAR | Status: DC | PRN
  Administered 2014-10-18 (×2): 1 mg via INTRAVENOUS

## 2014-10-18 MED ORDER — PROPOFOL 10 MG/ML IV BOLUS
INTRAVENOUS | Status: DC | PRN
  Administered 2014-10-18: 200 mg via INTRAVENOUS

## 2014-10-18 MED ORDER — BUPIVACAINE-EPINEPHRINE (PF) 0.5% -1:200000 IJ SOLN
INTRAMUSCULAR | Status: DC | PRN
  Administered 2014-10-18: 25 mL via PERINEURAL

## 2014-10-18 MED ORDER — MIDAZOLAM HCL 2 MG/2ML IJ SOLN
INTRAMUSCULAR | Status: AC
  Filled 2014-10-18: qty 2

## 2014-10-18 MED ORDER — PROPOFOL 10 MG/ML IV BOLUS
INTRAVENOUS | Status: AC
  Filled 2014-10-18: qty 80

## 2014-10-18 MED ORDER — LACTATED RINGERS IV SOLN
INTRAVENOUS | Status: DC
  Administered 2014-10-18 (×2): via INTRAVENOUS

## 2014-10-18 MED ORDER — FENTANYL CITRATE 0.05 MG/ML IJ SOLN
INTRAMUSCULAR | Status: AC
  Filled 2014-10-18: qty 2

## 2014-10-18 MED ORDER — SCOPOLAMINE 1 MG/3DAYS TD PT72
MEDICATED_PATCH | TRANSDERMAL | Status: AC
  Filled 2014-10-18: qty 1

## 2014-10-18 MED ORDER — MIDAZOLAM HCL 5 MG/5ML IJ SOLN
INTRAMUSCULAR | Status: DC | PRN
  Administered 2014-10-18: 2 mg via INTRAVENOUS

## 2014-10-18 MED ORDER — FENTANYL CITRATE 0.05 MG/ML IJ SOLN
INTRAMUSCULAR | Status: AC
  Filled 2014-10-18: qty 6

## 2014-10-18 MED ORDER — CHLORHEXIDINE GLUCONATE 4 % EX LIQD: 1.0000 "application " | Freq: Once | CUTANEOUS | Status: DC

## 2014-10-18 MED ORDER — OXYCODONE HCL 5 MG PO TABS
5.0000 mg | ORAL_TABLET | Freq: Once | ORAL | Status: AC | PRN
  Administered 2014-10-18: 5 mg via ORAL

## 2014-10-18 MED ORDER — TECHNETIUM TC 99M SULFUR COLLOID FILTERED
1.0000 | Freq: Once | INTRAVENOUS | Status: AC | PRN
  Administered 2014-10-18: 1 via INTRADERMAL

## 2014-10-18 MED ORDER — ONDANSETRON HCL 4 MG/2ML IJ SOLN: 4.0000 mg | Freq: Once | INTRAMUSCULAR | Status: DC | PRN

## 2014-10-18 MED ORDER — CEFAZOLIN SODIUM-DEXTROSE 2-3 GM-% IV SOLR: 2.0000 g | INTRAVENOUS | Status: DC

## 2014-10-18 MED ORDER — SCOPOLAMINE 1 MG/3DAYS TD PT72
1.0000 | MEDICATED_PATCH | TRANSDERMAL | Status: DC
  Administered 2014-10-18: 1.5 mg via TRANSDERMAL

## 2014-10-18 MED ORDER — FENTANYL CITRATE 0.05 MG/ML IJ SOLN
INTRAMUSCULAR | Status: DC | PRN
  Administered 2014-10-18: 100 ug via INTRAVENOUS

## 2014-10-18 MED ORDER — CEFAZOLIN SODIUM-DEXTROSE 2-3 GM-% IV SOLR
2.0000 g | INTRAVENOUS | Status: AC
  Administered 2014-10-18: 2 g via INTRAVENOUS

## 2014-10-18 MED ORDER — CEFAZOLIN SODIUM-DEXTROSE 2-3 GM-% IV SOLR
INTRAVENOUS | Status: AC
  Filled 2014-10-18: qty 50

## 2014-10-18 MED ORDER — LIDOCAINE HCL (CARDIAC) 20 MG/ML IV SOLN
INTRAVENOUS | Status: DC | PRN
  Administered 2014-10-18: 50 mg via INTRAVENOUS

## 2014-10-18 MED ORDER — SODIUM CHLORIDE 0.9 % IJ SOLN
INTRAMUSCULAR | Status: DC | PRN
  Administered 2014-10-18: 5 mL via INTRAMUSCULAR

## 2014-10-18 MED ORDER — SODIUM CHLORIDE 0.9 % IJ SOLN
INTRAMUSCULAR | Status: AC
  Filled 2014-10-18: qty 10

## 2014-10-18 MED ORDER — FENTANYL CITRATE 0.05 MG/ML IJ SOLN
50.0000 ug | INTRAMUSCULAR | Status: DC | PRN
  Administered 2014-10-18 (×2): 50 ug via INTRAVENOUS

## 2014-10-18 SURGICAL SUPPLY — 61 items
ADH SKN CLS APL DERMABOND .7 (GAUZE/BANDAGES/DRESSINGS) ×1
APL SKNCLS STERI-STRIP NONHPOA (GAUZE/BANDAGES/DRESSINGS)
APPLIER CLIP 9.375 MED OPEN (MISCELLANEOUS) ×2
APR CLP MED 9.3 20 MLT OPN (MISCELLANEOUS) ×1
BENZOIN TINCTURE PRP APPL 2/3 (GAUZE/BANDAGES/DRESSINGS) IMPLANT
BINDER BREAST LRG (GAUZE/BANDAGES/DRESSINGS) IMPLANT
BINDER BREAST MEDIUM (GAUZE/BANDAGES/DRESSINGS) IMPLANT
BINDER BREAST XLRG (GAUZE/BANDAGES/DRESSINGS) ×1 IMPLANT
BINDER BREAST XXLRG (GAUZE/BANDAGES/DRESSINGS) IMPLANT
BLADE HEX COATED 2.75 (ELECTRODE) ×2 IMPLANT
BLADE SURG 10 STRL SS (BLADE) IMPLANT
BLADE SURG 15 STRL LF DISP TIS (BLADE) ×1 IMPLANT
BLADE SURG 15 STRL SS (BLADE) ×2
CANISTER SUC SOCK COL 7IN (MISCELLANEOUS) IMPLANT
CANISTER SUCT 1200ML W/VALVE (MISCELLANEOUS) ×2 IMPLANT
CHLORAPREP W/TINT 26ML (MISCELLANEOUS) ×2 IMPLANT
CLIP APPLIE 9.375 MED OPEN (MISCELLANEOUS) ×1 IMPLANT
COVER BACK TABLE 60X90IN (DRAPES) ×2 IMPLANT
COVER MAYO STAND STRL (DRAPES) ×2 IMPLANT
COVER PROBE W GEL 5X96 (DRAPES) ×2 IMPLANT
DECANTER SPIKE VIAL GLASS SM (MISCELLANEOUS) IMPLANT
DERMABOND ADVANCED (GAUZE/BANDAGES/DRESSINGS) ×1
DERMABOND ADVANCED .7 DNX12 (GAUZE/BANDAGES/DRESSINGS) ×1 IMPLANT
DEVICE DUBIN W/COMP PLATE 8390 (MISCELLANEOUS) ×3 IMPLANT
DRAPE LAPAROSCOPIC ABDOMINAL (DRAPES) ×2 IMPLANT
DRAPE UTILITY XL STRL (DRAPES) ×2 IMPLANT
DRSG PAD ABDOMINAL 8X10 ST (GAUZE/BANDAGES/DRESSINGS) IMPLANT
ELECT REM PT RETURN 9FT ADLT (ELECTROSURGICAL) ×2
ELECTRODE REM PT RTRN 9FT ADLT (ELECTROSURGICAL) ×1 IMPLANT
GAUZE SPONGE 4X4 12PLY STRL (GAUZE/BANDAGES/DRESSINGS) IMPLANT
GLOVE BIO SURGEON STRL SZ 6.5 (GLOVE) ×3 IMPLANT
GLOVE EUDERMIC 7 POWDERFREE (GLOVE) ×4 IMPLANT
GOWN STRL REUS W/ TWL LRG LVL3 (GOWN DISPOSABLE) ×2 IMPLANT
GOWN STRL REUS W/ TWL XL LVL3 (GOWN DISPOSABLE) ×1 IMPLANT
GOWN STRL REUS W/TWL LRG LVL3 (GOWN DISPOSABLE) ×4
GOWN STRL REUS W/TWL XL LVL3 (GOWN DISPOSABLE) ×2
KIT MARKER MARGIN INK (KITS) ×2 IMPLANT
NDL HYPO 25X1 1.5 SAFETY (NEEDLE) ×2 IMPLANT
NDL SAFETY ECLIPSE 18X1.5 (NEEDLE) ×1 IMPLANT
NEEDLE HYPO 18GX1.5 SHARP (NEEDLE) ×2
NEEDLE HYPO 25X1 1.5 SAFETY (NEEDLE) ×4 IMPLANT
NS IRRIG 1000ML POUR BTL (IV SOLUTION) ×2 IMPLANT
PACK BASIN DAY SURGERY FS (CUSTOM PROCEDURE TRAY) ×2 IMPLANT
PENCIL BUTTON HOLSTER BLD 10FT (ELECTRODE) ×2 IMPLANT
SHEET MEDIUM DRAPE 40X70 STRL (DRAPES) ×1 IMPLANT
SLEEVE SCD COMPRESS KNEE MED (MISCELLANEOUS) ×2 IMPLANT
SPONGE LAP 18X18 X RAY DECT (DISPOSABLE) IMPLANT
SPONGE LAP 4X18 X RAY DECT (DISPOSABLE) ×3 IMPLANT
STRIP CLOSURE SKIN 1/2X4 (GAUZE/BANDAGES/DRESSINGS) IMPLANT
SUT MNCRL AB 4-0 PS2 18 (SUTURE) ×2 IMPLANT
SUT SILK 2 0 SH (SUTURE) ×3 IMPLANT
SUT VIC AB 2-0 CT1 27 (SUTURE)
SUT VIC AB 2-0 CT1 TAPERPNT 27 (SUTURE) IMPLANT
SUT VIC AB 3-0 SH 27 (SUTURE)
SUT VIC AB 3-0 SH 27X BRD (SUTURE) IMPLANT
SUT VICRYL 3-0 CR8 SH (SUTURE) ×3 IMPLANT
SYRINGE 10CC LL (SYRINGE) ×4 IMPLANT
TOWEL OR 17X24 6PK STRL BLUE (TOWEL DISPOSABLE) ×2 IMPLANT
TOWEL OR NON WOVEN STRL DISP B (DISPOSABLE) ×2 IMPLANT
TUBE CONNECTING 20X1/4 (TUBING) ×2 IMPLANT
YANKAUER SUCT BULB TIP NO VENT (SUCTIONS) ×2 IMPLANT

## 2014-10-18 NOTE — Anesthesia Postprocedure Evaluation (Signed)
Anesthesia Post Note  Patient: Melissa Holloway  Procedure(s) Performed: Procedure(s) (LRB): LEFT PARTIAL MASTECTOMY AND 1 LEFT RADIOACTIVE SEED 1 NEEDLE LOCALIZATION LEFT AXILLARY SENTINAL NODE BIOPSY (Left)  Anesthesia type: general  Patient location: PACU  Post pain: Pain level controlled  Post assessment: Patient's Cardiovascular Status Stable  Last Vitals:  Filed Vitals:   10/18/14 1438  BP: 122/70  Pulse: 70  Temp: 36.6 C  Resp: 16    Post vital signs: Reviewed and stable  Level of consciousness: sedated  Complications: No apparent anesthesia complications

## 2014-10-18 NOTE — Interval H&P Note (Signed)
History and Physical Interval Note:  10/18/2014 11:01 AM  Melissa Holloway  has presented today for surgery, with the diagnosis of left breast adh  The various methods of treatment have been discussed with the patient and family. After consideration of risks, benefits and other options for treatment, the patient has consented to  Procedure(s): LEFT PARTIAL MASTECTOMY AND 1 LEFT RADIOACTIVE SEED 1 NEEDLE LOCALIZATION LEFT AXILLARY SENTINAL NODE BIOPSY (Left) as a surgical intervention .  The patient's history has been reviewed, patient examined, no change in status, stable for surgery.  I have reviewed the patient's chart and labs.  Questions were answered to the patient's satisfaction.     Adin Hector

## 2014-10-18 NOTE — Transfer of Care (Signed)
Immediate Anesthesia Transfer of Care Note  Patient: Melissa Holloway  Procedure(s) Performed: Procedure(s): LEFT PARTIAL MASTECTOMY AND 1 LEFT RADIOACTIVE SEED 1 NEEDLE LOCALIZATION LEFT AXILLARY SENTINAL NODE BIOPSY (Left)  Patient Location: PACU  Anesthesia Type:General and GA combined with regional for post-op pain  Level of Consciousness: awake  Airway & Oxygen Therapy: Patient Spontanous Breathing and Patient connected to face mask oxygen  Post-op Assessment: Report given to RN and Post -op Vital signs reviewed and stable  Post vital signs: Reviewed and stable  Last Vitals:  Filed Vitals:   10/18/14 1217  BP: 123/58  Pulse:   Temp:   Resp:     Complications: No apparent anesthesia complications

## 2014-10-18 NOTE — Progress Notes (Signed)
Assisted Dr. Crews with left, ultrasound guided, pectoralis block. Side rails up, monitors on throughout procedure. See vital signs in flow sheet. Tolerated Procedure well. 

## 2014-10-18 NOTE — Anesthesia Preprocedure Evaluation (Signed)
Anesthesia Evaluation  Patient identified by MRN, date of birth, ID band Patient awake    Reviewed: Allergy & Precautions, NPO status , Patient's Chart, lab work & pertinent test results  History of Anesthesia Complications (+) PONV  Airway Mallampati: I  TM Distance: >3 FB Neck ROM: Full    Dental  (+) Teeth Intact, Dental Advisory Given   Pulmonary  breath sounds clear to auscultation        Cardiovascular Rhythm:Regular Rate:Normal     Neuro/Psych    GI/Hepatic   Endo/Other    Renal/GU      Musculoskeletal   Abdominal   Peds  Hematology   Anesthesia Other Findings   Reproductive/Obstetrics                             Anesthesia Physical Anesthesia Plan  ASA: II  Anesthesia Plan: General   Post-op Pain Management:    Induction: Intravenous  Airway Management Planned: LMA  Additional Equipment:   Intra-op Plan:   Post-operative Plan: Extubation in OR  Informed Consent: I have reviewed the patients History and Physical, chart, labs and discussed the procedure including the risks, benefits and alternatives for the proposed anesthesia with the patient or authorized representative who has indicated his/her understanding and acceptance.   Dental advisory given  Plan Discussed with: CRNA, Anesthesiologist and Surgeon  Anesthesia Plan Comments:         Anesthesia Quick Evaluation

## 2014-10-18 NOTE — Anesthesia Procedure Notes (Addendum)
Anesthesia Regional Block:  Pectoralis block  Pre-Anesthetic Checklist: ,, timeout performed, Correct Patient, Correct Site, Correct Laterality, Correct Procedure, Correct Position, site marked, Risks and benefits discussed,  Surgical consent,  Pre-op evaluation,  At surgeon's request and post-op pain management  Laterality: Left and Upper  Prep: chloraprep       Needles:  Injection technique: Single-shot  Needle Type: Echogenic Needle     Needle Length: 9cm 9 cm Needle Gauge: 21 and 21 G    Additional Needles:  Procedures: ultrasound guided (picture in chart) Pectoralis block Narrative:  Start time: 10/18/2014 12:11 PM End time: 10/18/2014 12:16 PM Injection made incrementally with aspirations every 5 mL.  Performed by: Personally  Anesthesiologist: CREWS, DAVID   Procedure Name: LMA Insertion Date/Time: 10/18/2014 12:27 PM Performed by: Marrianne Mood Pre-anesthesia Checklist: Patient identified, Emergency Drugs available, Suction available, Patient being monitored and Timeout performed Patient Re-evaluated:Patient Re-evaluated prior to inductionOxygen Delivery Method: Circle System Utilized Preoxygenation: Pre-oxygenation with 100% oxygen Intubation Type: IV induction Ventilation: Mask ventilation without difficulty LMA: LMA inserted LMA Size: 4.0 Number of attempts: 1 Airway Equipment and Method: Bite block Placement Confirmation: positive ETCO2 Tube secured with: Tape Dental Injury: Teeth and Oropharynx as per pre-operative assessment

## 2014-10-18 NOTE — Op Note (Signed)
Patient Name:           Melissa Holloway   Date of Surgery:        10/18/2014  Pre op Diagnosis:      Invasive duct carcinoma left breast, upper inner quadrant, hormone receptor positive, HER-2 negative                                      Atypical ductal hyperplasia left breast, central  Post op Diagnosis:    Same  Procedure:                 Left partial mastectomy with radioactive seed localization for invasive cancer, upper inner quadrant                                      Left partial mastectomy with wire localization for atypical ductal hyperplasia, central                                      Inject blue dye left breast                                      Left axillary sentinel lymph node mapping and biopsy  Surgeon:                     Edsel Petrin. Dalbert Batman, M.D., FACS  Assistant:                      Or staff  Operative Indications:   The patient is a 69 year old female who presents with  left breast cancer, upper inner quadrant. Initially she was evaluated for a 7 mm mass in the left breast, upper inner quadrant, 3 cm from the nipple. Biopsy showed invasive ductal carcinoma, grade 1, receptor positive, HER-2 negative. Subsequent MRI showed a 1 cm mass in the central left breast and a 6 mm mass in the lower outer quadrant of the right breast. there was no adenopathy. A second area in the left breast, l central, was biopsied and shows atypical ductal hyperplasia. This area also  needs to be excised. A third area in the right breast, lower outer quadrant was biopsied and shows benign fibroadenoma. The patient is comfortable doing nothing about this. She has seen Dr. Jana Hakim from medical oncology. She has had genetic testing and reports that this is negative.  We talked in detail about the options of double lumpectomy, sentinel node biopsy versus mastectomy and SLN biopsy with or without reconstruction. She clearly desires double lumpectomy. She knows she will lose some volume and is  very comfortable with that. She knows that we will make all efforts for good cosmesis. We described the procedure of double left breast lumpectomy with radioactive seed localization and left axillary sentinel node biopsy.  Addendum: After discussion with Dr. Shon Hale of radiology, we felt that the 2 areas were too close to each other for double seed placement. We have therefore changed our plan to place a radioactive seed in the cancer and the wire in the area of the atypical ductal hyperplasia. The patient will be informed by  the [place] office staff.  Operative Findings:       The lumpectomy specimen in the upper inner left breast looks very good with the biopsy clip fragment and the radioactive seed present in the center of the specimen. The second lumpectomy specimen, deeper and central in the left breast, showed the wire and all of the marker clips to be present. I found 2 sentinel lymph nodes.    The radiologist called me this morning and stated that the biopsy marker clip in the upper inner quadrant with a cancer were has had fragmented in two  and one part was in the cancer and one had migrated down into the central breast near the area of atypical ductal hyperplasia. I could clearly identify the radioactivity with the neoprobe in the holding area.  Procedure in Detail:          Radioactive seed placement in the cancer, and wire localization placement in the area of ADH was performed this morning at the breast center of [facility]. The patient was brought to the holding area where she was examined with the neoprobe as described above. She underwent injection of radionuclide in the left breast by the nuclear medicine technician. The pectoral block was performed by the anesthesiologist.      The patient was taken to the operating room and underwent general anesthesia with LMA device. Surgical timeout was performed. Intravenous antibiotics were given. Following alcohol prep I injected 5 mL of blue dye  in the left breast, subareolar area and massaged the breast for a few minutes.      The left breast and axilla were then prepped and draped in a sterile fashion. 0.5% Marcaine with epinephrine was used as local infiltration anesthetic. Using the neoprobe I identified the radioactive seeds in the left breast, upper inner quadrant. This was relatively superficial in the breast. I made a curvilinear incision, circumareolar in nature overlying the radioactivity. I dissected down into the breast tissue around the radioactivity. The specimen was removed and marked with silk sutures and multicolored ink  to orient the pathologist. Specimen mammogram looked good as described above. The specimen was sent to the lab. The localizing wire was inserted from medial to lateral and went deep and under the lumpectomy area. I chose to extend my incision and simply dissected the wire into the wound and then I performed a lumpectomy around the wire. The specimen was removed and marked with silk sutures to orient the pathologist. The specimen mammogram of atypical ductal hyperplasia also looked good with the wire and the marker clips present within the specimen. This was marked and sent to lab as well. Therefore, I performed both lumpectomy still a single incision. Hemostasis was excellent and achieved with electrocautery. The wound was irrigated with saline and packed off.    I then made a left axillary incision at the hairline. Dissection was carried down through the subcutaneous use tissue, through the clavipectoral fascia and the axilla was entered. I found a sentinel lymph node in level I and another sentinel lymph node high in level II. These were both very hot and somewhat blue. I found no more radioactivity after this was done and I chose to quit dissecting at this point. The wound was irrigated with saline. Hemostasis was excellent. Both wounds were closed in the deeper layers with interrupted 3-0 Vicryl and the skin with  running subcuticular  4-0 Monocryl and Dermabond. Breast binder was placed. Patient tolerated the procedure well and was taken to  PACU in stable condition. EBL 25 mL. Counts correct. Complications none.    Edsel Petrin. Dalbert Batman, M.D., FACS General and Minimally Invasive Surgery Breast and Colorectal Surgery  10/18/2014 1:50 PM

## 2014-10-18 NOTE — Discharge Instructions (Signed)
[facility] °Office Phone Number [phone] ° °BREAST BIOPSY/ LUMPECTOMY: POST OP INSTRUCTIONS ° °Always review your discharge instruction sheet given to you by the facility where your surgery was performed. ° °IF YOU HAVE DISABILITY OR FAMILY LEAVE FORMS, YOU MUST BRING THEM TO THE OFFICE FOR PROCESSING.  DO NOT GIVE THEM TO YOUR DOCTOR. ° °1. A prescription for pain medication may be given to you upon discharge.  Take your pain medication as prescribed, if needed.  If narcotic pain medicine is not needed, then you may take acetaminophen (Tylenol) or ibuprofen (Advil) as needed. °2. Take your usually prescribed medications unless otherwise directed °3. If you need a refill on your pain medication, please contact your pharmacy.  They will contact our office to request authorization.  Prescriptions will not be filled after 5pm or on week-ends. °4. You should eat very light the first 24 hours after surgery, such as soup, crackers, pudding, etc.  Resume your normal diet the day after surgery. °5. Most patients will experience some swelling and bruising in the breast.  Ice packs and a good support bra will help.  Swelling and bruising can take several days to resolve.  °6. It is common to experience some constipation if taking pain medication after surgery.  Increasing fluid intake and taking a stool softener will usually help or prevent this problem from occurring.  A mild laxative (Milk of Magnesia or Miralax) should be taken according to package directions if there are no bowel movements after 48 hours. °7. Unless discharge instructions indicate otherwise, you may remove your bandages 24-48 hours after surgery, and you may shower at that time.  You may have steri-strips (small skin tapes) in place directly over the incision.  These strips should be left on the skin for 7-10 days.  If your surgeon used skin glue on the incision, you may shower in 24 hours.  The glue will flake off over the next 2-3  weeks.  Any sutures or staples will be removed at the office during your follow-up visit. °8. ACTIVITIES:  You may resume regular daily activities (gradually increasing) beginning the next day.  Wearing a good support bra or sports bra minimizes pain and swelling.  You may have sexual intercourse when it is comfortable. °a. You may drive when you no longer are taking prescription pain medication, you can comfortably wear a seatbelt, and you can safely maneuver your car and apply brakes. °b. RETURN TO WORK:  ______________________________________________________________________________________ °9. You should see your doctor in the office for a follow-up appointment approximately two weeks after your surgery.  Your doctor’s nurse will typically make your follow-up appointment when she calls you with your pathology report.  Expect your pathology report 2-3 business days after your surgery.  You may call to check if you do not hear from us after three days. °10. OTHER INSTRUCTIONS: _______________________________________________________________________________________________ _____________________________________________________________________________________________________________________________________ °_____________________________________________________________________________________________________________________________________ °_____________________________________________________________________________________________________________________________________ ° °WHEN TO CALL YOUR DOCTOR: °1. Fever over 101.0 °2. Nausea and/or vomiting. °3. Extreme swelling or bruising. °4. Continued bleeding from incision. °5. Increased pain, redness, or drainage from the incision. ° °The clinic staff is available to answer your questions during regular business hours.  Please don’t hesitate to call and ask to speak to one of the nurses for clinical concerns.  If you have a medical emergency, go to the nearest emergency  room or call 911.  A surgeon from [facility] is always on call at the hospital. ° °For further questions, please visit [email or web address]  ° °  Post Anesthesia Home Care Instructions ° °Activity: °Get plenty of rest for the remainder of the day. A responsible adult should stay with you for 24 hours following the procedure.  °For the next 24 hours, DO NOT: °-Drive a car °-Operate machinery °-Drink alcoholic beverages °-Take any medication unless instructed by your physician °-Make any legal decisions or sign important papers. ° °Meals: °Start with liquid foods such as gelatin or soup. Progress to regular foods as tolerated. Avoid greasy, spicy, heavy foods. If nausea and/or vomiting occur, drink only clear liquids until the nausea and/or vomiting subsides. Call your physician if vomiting continues. ° °Special Instructions/Symptoms: °Your throat may feel dry or sore from the anesthesia or the breathing tube placed in your throat during surgery. If this causes discomfort, gargle with warm salt water. The discomfort should disappear within 24 hours. ° °

## 2014-10-19 ENCOUNTER — Encounter (HOSPITAL_BASED_OUTPATIENT_CLINIC_OR_DEPARTMENT_OTHER): Payer: Self-pay | Admitting: General Surgery

## 2014-10-19 NOTE — Addendum Note (Signed)
Addendum  created 10/19/14 0802 by Tawni Millers, CRNA   Modules edited: Charges VN

## 2014-11-02 ENCOUNTER — Ambulatory Visit: Payer: Medicare Other

## 2014-11-02 ENCOUNTER — Ambulatory Visit: Payer: Medicare Other | Admitting: Radiation Oncology

## 2014-11-03 NOTE — Progress Notes (Addendum)
Location of Breast Cancer: Left Breast, Upper Inner Quadrant  Histology per Pathology Report:   10/18/14 Diagnosis 1. Breast, lumpectomy, left upper inner quadrant - INVASIVE GRADE I DUCTAL CARCINOMA, SPANNING 0.6 CM IN GREATEST DIMENSION. 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 - LOW GRADE DUCTAL CARCINOMA IN SITU INVOLVES THE LATERAL MARGIN. - LOW GRADE DUCTAL CARCINOMA IN SITU IS EXTREMELY CLOSE TO POSTERIOR MARGIN (LESS THAN 0.1 CM). - LOW GRADE DUCTAL CARCINOMA IN SITU IS CLOSE TO SUPERIOR (0.18 CM AWAY), INFERIOR (0.2 CM AWAY), AND ANTERIOR (0.27 CM AWAY) MARGIN. - OTHER MARGINS ARE NEGATIVE. - SEE ONCOLOGY TEMPLATE. 2. Breast, lumpectomy, left central - LOW GRADE DUCTAL CARCINOMA IN SITU. 1 of 4 FINAL for Ogata, Veretta P ([id number]) Diagnosis(continued) - LOBULAR NEOPLASIA (LOBULAR CARCINOMA IN SITU). - LOW GRADE DUCTAL CARCINOMA IN SITU IS EXTREMELY CLOSE (LESS THAN 0.1 CM) TO LATERAL MARGIN. - LOW GRADE DUCTAL CARCINOMA IN SITU IS CLOSE (0.2 CM) TO MEDIAL MARGIN. - OTHER MARGINS ARE NEGATIVE. - SEE ONCOLOGY TEMPLATE. 3. Lymph node, sentinel, biopsy, left #1 - ONE BENIGN LYMPH NODE WITH NO TUMOR SEEN (0/1). 4. Lymph node, sentinel, biopsy, left #2 - ONE BENIGN LYMPH NODE WITH NO TUMOR SEEN (0/1). Dr. Renelda Loma Ingram,MD,    09/27/14 Diagnosis 1. Breast, left, needle core biopsy, central - ATYPICAL DUCTAL HYPERPLASIA. - LOBULAR NEOPLASIA (ATYPICAL LOBULAR HYPERPLASIA). - SEE COMMENT. 2. Breast, right, needle core biopsy, lower outer - FIBROADENOMA WITH ASSOCIATED FIBROCYSTIC CHANGES. - NO ATYPIA OR MALIGNANCY IDENTIFIED.   Receptor Status: ER(94%), PR (77%), Her2-neu (Neg), Ki-67(10%)  Ms. Tammee Thielke had a routine screening mammography with tomography [facility] 08[facility]23/2014. This showed a possible mass in the left breast. On 08/16/2014 left diagnostic mammography and ultrasonography found the breast density to be category B. In the upper inner  quadrant of the left breast there was a 7 mm spiculated mass which was not palpable by exam. Ultrasound confirmed a 5 mm irregular to her than wide hypoechoic mass in the area in question. There was no left axillary adenopathy.  Past/Anticipated interventions by surgeon, if any:  Dr. Lisabeth Register: Lumpectomy of the left breast, Dr. Fanny Skates last seen  Tuesday 11/07/14 will probably do a re-excision in a couple weeks   Past/Anticipated interventions by medical oncology, if any: Dr. Lurline Del - Antiestrogen following Radiation Therapy.  Oncotype testing - pending  Lymphedema issues, if any: no  Pain issues, if any: mild if any   SAFETY ISSUES:  Prior radiation?NO  Pacemaker/ICD?NO  Possible current pregnancy?NO  Is the patient on methotrexate? NO  Current Complaints / other details:  Married,   Menarche age 18, first live birth age 69. The patient is GX P1. The patient went through menopause around 2008. She did not take hormone replacement. She did take oral contraceptives remotely for more than a year with no complications  Family history significant for breast cancer; Mother,(dx age 65)  Deceased age 8  Alhzheimers/dementia, had a stroke from wrong medication, Paternal 62, 35 living,  Maternal Aunt.deceased Allergies: NKDA    Cheston, Crista Curb, RN 11/03/2014,3:53 PM

## 2014-11-07 ENCOUNTER — Encounter: Payer: Self-pay | Admitting: *Deleted

## 2014-11-07 ENCOUNTER — Encounter: Payer: Self-pay | Admitting: Radiation Oncology

## 2014-11-07 ENCOUNTER — Other Ambulatory Visit: Payer: Self-pay | Admitting: General Surgery

## 2014-11-08 ENCOUNTER — Telehealth: Payer: Self-pay | Admitting: *Deleted

## 2014-11-08 ENCOUNTER — Encounter: Payer: Self-pay | Admitting: Radiation Oncology

## 2014-11-08 ENCOUNTER — Telehealth: Payer: Self-pay | Admitting: Oncology

## 2014-11-08 NOTE — Telephone Encounter (Signed)
Received order for oncotype testing from Dr. Jana Hakim. Requisition sent to pathology. Received by Alyse Low.

## 2014-11-08 NOTE — Telephone Encounter (Signed)
S/w pt confirming MD visit per 04/05 POF, mailed schedule to pt.... Melissa Holloway

## 2014-11-09 ENCOUNTER — Ambulatory Visit
Admission: RE | Admit: 2014-11-09 | Discharge: 2014-11-09 | Disposition: A | Discharge status: Home / Self Care | Payer: Medicare Other | Source: Ambulatory Visit | Attending: Radiation Oncology | Admitting: Radiation Oncology

## 2014-11-09 ENCOUNTER — Ambulatory Visit
Admission: RE | Admit: 2014-11-09 | Discharge: 2014-11-09 | Disposition: A | Discharge status: Home / Self Care | Payer: Medicare Other | Source: Ambulatory Visit

## 2014-11-09 ENCOUNTER — Encounter: Payer: Self-pay | Admitting: Radiation Oncology

## 2014-11-09 VITALS — BP 136/74 | HR 73 | Temp 98.1°F | Resp 20 | Ht 63.0 in | Wt 192.9 lb

## 2014-11-09 DIAGNOSIS — C50212 Malignant neoplasm of upper-inner quadrant of left female breast: Secondary | ICD-10-CM

## 2014-11-09 DIAGNOSIS — C50912 Malignant neoplasm of unspecified site of left female breast: Secondary | ICD-10-CM | POA: Insufficient documentation

## 2014-11-09 NOTE — Progress Notes (Signed)
[facility] NEW PATIENT EVALUATION  Name: Melissa Holloway MRN: [id number]  Date:   11/09/2014           DOB: Dec 31, 1945  Status: outpatient   CC: Lilian Coma, MD  Magrinat, Virgie Dad, MD Dr. Fanny Skates   REFERRING PHYSICIAN: Dr. 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 female who is seen today through the courtesy Dr. Dalbert Batman of for evaluation of her T1b N0 invasive ductal/DCIS of left breast with multicentric disease.  She was seen by Dr. Dalbert Batman on 09/12/2014 with a newly diagnosed invasive ductal carcinoma of left breast within the upper inner quadrant.  Screening mammography showed a 0.7 cm mass within the upper inner quadrant at 11:00.  Image guided biopsy showed invasive ductal carcinoma, grade 1, receptor positive, HER-2/neu negative.  Breast MR showed a 1 cm mass in the upper inner quadrant of left breast and also 1.0 cm mass in the lower outer quadrant of the left breast and a 6 mm mass in the lower outer quadrant of the right breast.  There was no adenopathy.  The upper inner quadrant left breast mass was diagnostic for invasive ductal carcinoma, grade 1, receptor positive, HER-2/neu negative.  The lower outer quadrant lesion in the left breast showed atypical ductal hyperplasia.  The lesion within the right breast, lower outer quadrant was biopsied and showed a benign fibroadenoma.  On 10/18/2014 she underwent excision of the left upper inner quadrant mass which showed a 0.6 cm invasive ductal carcinoma along with low-grade DCIS.  The DCIS involved the lateral margin and also came extremely close to the posterior margin, less than 0.1 cm.  There was also low-grade DCIS close to the superior and inferior margins, approximately 0.2 cm.  The lower breast lesion showed low-grade DCIS with lobular carcinoma in situ.  This also came extremely  close, less than 0.1 cm to the lateral margin and 0.2 cm to the medial margin.  2 lymph nodes were free of metastatic disease.  She was presented at the Wednesday morning breast cancer conference this week, and she is willing to undergo reexcision recognizing that this may compromise her cosmesis.  She was also been seen in consultation by Dr. Gunnar Bulla Magrinat.  PREVIOUS RADIATION THERAPY: No   PAST MEDICAL HISTORY:  has a past medical history of Esophageal hernia; Breast cancer, left breast (08/23/2014); Family history of breast cancer; PONV (postoperative nausea and vomiting); and Arthritis.     PAST SURGICAL HISTORY:  Past Surgical History  Procedure Laterality Date  . Esophageal hernia  10  . Tonsillectomy    . Total knee arthroplasty Left 01/09/2014    Procedure: LEFT TOTAL KNEE ARTHROPLASTY;  Surgeon: Kerin Salen, MD;  Location: [place];  Service: Orthopedics;  Laterality: Left;  . Colonoscopy    . Radioactive seed guided mastectomy with axillary sentinel lymph node biopsy Left 10/18/2014    Procedure: LEFT PARTIAL MASTECTOMY AND 1 LEFT RADIOACTIVE SEED 1 NEEDLE LOCALIZATION LEFT AXILLARY SENTINAL NODE BIOPSY;  Surgeon: Fanny Skates, MD;  Location: [place];  Service: General;  Laterality: Left;     FAMILY HISTORY: family history includes Breast cancer (age of onset: 61) in her paternal aunt; Breast cancer (age of onset: 68) in her mother; Breast cancer (age of onset: 68) in her maternal aunt; Cancer in her paternal grandmother; Prostate  cancer (age of onset: 74) in her brother.  Her mother died at age 59 following a stroke.  She also had dementia.  Her father died at age 22.   SOCIAL HISTORY:  reports that she has never smoked. She does not have any smokeless tobacco history on file. She reports that she does not drink alcohol or use illicit drugs. Married, one child.  Retired from Press photographer for [employer].   ALLERGIES: Review of patient's allergies indicates no known  allergies.   MEDICATIONS:  Current Outpatient Prescriptions  Medication Sig Dispense Refill  . aspirin EC 81 MG tablet Take 81 mg by mouth daily.    Marland Kitchen atorvastatin (LIPITOR) 20 MG tablet Take 20 mg by mouth daily.    . Loratadine (CLARITIN PO) Take 10 mg by mouth as needed.    Marland Kitchen Propylene Glycol (SYSTANE BALANCE OP) Place 1 drop into both eyes every morning.    . vitamin C (ASCORBIC ACID) 500 MG tablet Take 500 mg by mouth daily.    Marland Kitchen HYDROcodone-acetaminophen (NORCO) 5-325 MG per tablet Take 1-2 tablets by mouth every 6 (six) hours as needed for moderate pain or severe pain. (Patient not taking: Reported on 11/09/2014) 30 tablet 0   No current facility-administered medications for this encounter.     REVIEW OF SYSTEMS:  Pertinent items are noted in HPI.    PHYSICAL EXAM:  height is '5\' 3"'  (1.6 m) and weight is 192 lb 14.4 oz (87.499 kg). Her oral temperature is 98.1 F (36.7 C). Her blood pressure is 136/74 and her pulse is 73. Her respiration is 20.   Alert and oriented 69 year old white female appearing younger than her stated age.  Head and neck examination: Grossly unremarkable.  Nodes: Without palpable cervical, supraclavicular, or axillary lymphadenopathy.  Chest: Lungs clear.  Breasts: There is a partial mastectomy wound along the left breast extending from approximately 9 to 12:00.  No discreet masses are appreciated.  Right breast without masses or lesions.  Extremities: Without edema.   LABORATORY DATA:  Lab Results  Component Value Date   WBC 6.2 10/10/2014   HGB 14.4 10/10/2014   HCT 43.9 10/10/2014   MCV 87.5 10/10/2014   PLT 262 10/10/2014   Lab Results  Component Value Date   NA 143 10/10/2014   K 4.0 10/10/2014   CL 103 01/03/2014   CO2 28 10/10/2014   Lab Results  Component Value Date   ALT 25 10/10/2014   AST 25 10/10/2014   ALKPHOS 103 10/10/2014   BILITOT 0.53 10/10/2014      IMPRESSION: Clinical stage I A (T1b N0 M0) invasive ductal/DCIS,  multifocal.  I explained to the patient that local treatment options include mastectomy versus breast conservation.  One has to consider the possibility that she has occult DCIS through a major portion of the breast since her DCIS at both sites is low-grade with close or involved margins.  Of note is that the 2 clips within the left breast were approximately 4 cm apart.  I explained to the patient that there is the ACOSOG U04540 trial looking at patients with multiple ipsilateral breast cancers if we truly believe that she has separate cancers.  After lengthy discussion she is not interested in pursuing this trial or speaking with one of the protocol nurses.  At this time, she is willing to consider reexcision recognizing that this may affect ultimate cosmesis and the fact that she may require a mastectomy (third surgery) if her DCIS is more extensive  than anticipated.  The alternative would be to proceed with mastectomy with or without reconstruction.  She is currently being scheduled for surgery.   PLAN: As discussed above.  I am more than happy to see the patient postoperatively, and she will be reviewed at a Wednesday morning conference.  I spent 60  minutes face to face with the patient and more than 50% of that time was spent in counseling and/or coordination of care.

## 2014-11-09 NOTE — Progress Notes (Signed)
Please see the Nurse Progress Note in the MD Initial Consult Encounter for this patient. 

## 2014-11-24 ENCOUNTER — Encounter (HOSPITAL_BASED_OUTPATIENT_CLINIC_OR_DEPARTMENT_OTHER): Payer: Self-pay | Admitting: *Deleted

## 2014-11-26 NOTE — H&P (Signed)
[facility] P[facility]  Location: [facility] Patient #: [id number] DOB: 04/08/1946 Married / Language: English / Race: White Female        History of Present Illness  Patient words: post Op- Left Breast.  The patient is a 69 year old female who presents with breast cancer. She returns to see me for a second postop visit, states that she has reconsidered and now wants to do definitive surgery for her left breast cancer with positive margins. She has an appointment see Dr. Jana Hakim and Dr. Valere Dross later this week. I told her we would probably discuss her case at breast cancer conference tomorrow. I told her if there was any new insight I would call her. History is significant for left partial mastectomy with radioactive seed localization for invasive cancer, upper inner quadrant on October 18, 2014. She also underwent a second lumpectomy in a more central location. Final pathology shows a 6 mm invasive carcinoma upper inner quadrant, completely excised. Both lumpectomy specimens, however showed low-grade DCIS. The DCIS involved the lateral margin of the upper specimen and several other margins are close, 2 mm or less. 2 sentinel lymph nodes are negative. Hormone receptors positive. HER-2 negative. Ki-67 10%. Stage TIb N0. She has no complaints about wound healing. She is happy with the cosmetic result and is very comfortable When I saw her on more October 26, 2014 she was opposed any further surgery and wanted to talk to Dr. Valere Dross and Dr. Jana Hakim about primary radiation therapy. She has changed her mind and wants to do whatever is necessary, including a mastectomy. I told her that mastectomy would probably lower her recurrence rate but that I did not see that it had a survival advantage. We talked about lumpectomy mastectomy reconstruction. At the end of the lengthy discussion she decided to go ahead and schedule for left partial mastectomy with reexcision of margins. She notes  she continues to have positive margins that she might need a mastectomy. We talked about indications, details, techniques, and numerous risk of the surgery with her. She is aware the risks of bleeding, infection, further volume loss and cosmetic deformity, continue parted positive margins requiring further surgery, and other unforeseen problems. She understands all these issues and all of her questions are answered. She agrees with this plan.   Allergies  No Known Drug Allergies02/04/2015  Medication History Atorvastatin Calcium (20MG Tablet, Oral) Active. Aspirin (325MG Tablet, Oral) Active. Claritin-D 24 Hour (10-240MG Tablet ER 24HR, Oral) Active. Oxycodone-Acetaminophen (5-325MG Tablet, Oral as needed) Active.  Vitals   Weight: 190.13 lb Height: 63in Body Surface Area: 1.96 m Body Mass Index: 33.68 kg/m Temp.: 98.44F(Oral)  Pulse: 76 (Regular)  BP: 148/76 (Sitting, Right Arm, Standard)    Physical Exam  General Note: Alert. Extremely pleasant. Good insight.   Head and Neck Note: No adenopathy or mass.   Breast Note: Left breast and left axillary incisions are healing nicely. Excellent cosmetic result. No hematoma or infection. Edema of the areola getting better. Excellent range of motion of her shoulder.   Cardiovascular Note: Regular rate and rhythm. No murmur. No ectopy.     Assessment & Plan PRIMARY CANCER OF UPPER INNER QUADRANT OF LEFT FEMALE BREAST (174.2  C50.212) Current Plans  Schedule for Surgery You are recovering from your left partial mastectomy, 2 areas, without any surgical or wound problems. We will over all of your pathology reports again today. Your invasive cancer was completely removed and was only 6 mm in size. We found  low-grade ductal carcinoma in situ in several areas of both of the breast specimens both in the upper inner quadrant and in the central area. In one area, the DCIS involved the lateral margin. A  few other margins are very close. The sentinel lymph nodes are negative which is good news. We talked once again about her options of reexcision of margins and radiation therapy, mastectomy with or without reconstruction, and holding off on surgery until you got a second opinion You told me that you have an appointment with Dr. Valere Dross this week but that she wanted to go ahead and have reexcision of margins. You stated that you had thought more about this and wanted to do everything you could to stay within the standard of care. We talked about techniques and risks of this surgery in detail. We talked about the low but possible definite possibility of having continued positive margins following a second excision., which would indicate a more extensive DCIS situation. We will schedule the surgery in the near future.  DCIS (DUCTAL CARCINOMA IN SITU), LEFT (233.0  D05.12) FAMILY HISTORY OF BREAST CANCER IN FEMALE (V16.3  Z80.3) HISTORY OF REPAIR OF HIATAL HERNIA (V15.29  Z98.89)    Edsel Petrin. Dalbert Batman, M.D., [facility] [place] General and Minimally invasive Surgery Breast and Colorectal Surgery Office:   [phone] Pager:   [phone]

## 2014-11-26 NOTE — H&P (Signed)
Melissa Holloway  Location: [facility] Patient #: [id number] DOB: [birth year] Married / Language: English / Race: White Female      History of Present Illness  Patient words: post Op- Left Breast.  The patient is a 69 year old female who presents with breast cancer. She returns to see me for a second postop visit, states that she has reconsidered and now wants to do definitive surgery for her left breast cancer with positive margins. She has an appointment see Dr. Jana Hakim and Dr. Valere Dross later this week. I told her we would probably discuss her case at breast cancer conference tomorrow. I told her if there was any new insight I would call her. History is significant for left partial mastectomy with radioactive seed localization for invasive cancer, upper inner quadrant on October 18, 2014. She also underwent a second lumpectomy in a more central location. Final pathology shows a 6 mm invasive carcinoma upper inner quadrant, completely excised. Both lumpectomy specimens, however showed low-grade DCIS. The DCIS involved the lateral margin of the upper specimen and several other margins are close, 2 mm or less. 2 sentinel lymph nodes are negative. Hormone receptors positive. HER-2 negative. Ki-67 10%. Stage TIb N0. She has no complaints about wound healing. She is happy with the cosmetic result and is very comfortable When I saw her on more October 26, 2014 she was opposed any further surgery and wanted to talk to Dr. Valere Dross and Dr. Jana Hakim about primary radiation therapy. She has changed her mind and wants to do whatever is necessary, including a mastectomy. I told her that mastectomy would probably lower her recurrence rate but that I did not see that it had a survival advantage. We talked about lumpectomy mastectomy reconstruction. At the end of the lengthy discussion she decided to go ahead and schedule for left partial mastectomy with reexcision of margins. She notes she  continues to have positive margins that she might need a mastectomy. We talked about indications, details, techniques, and numerous risk of the surgery with her. She is aware the risks of bleeding, infection, further volume loss and cosmetic deformity, continue parted positive margins requiring further surgery, and other unforeseen problems. She understands all these issues and all of her questions are answered. She agrees with this plan.   Allergies No Known Drug Allergies02/04/2015  Medication History  Atorvastatin Calcium (20MG Tablet, Oral) Active. Aspirin (325MG Tablet, Oral) Active. Claritin-D 24 Hour (10-240MG Tablet ER 24HR, Oral) Active. Oxycodone-Acetaminophen (5-325MG Tablet, Oral as needed) Active.  Vitals M)  Weight: 190.13 lb Height: 63in Body Surface Area: 1.96 m Body Mass Index: 33.68 kg/m Temp.: 98.36F(Oral)  Pulse: 76 (Regular)  BP: 148/76 (Sitting, Right Arm, Standard)    Physical Exam  General Note: Alert. Extremely pleasant. Good insight.   Head and Neck Note: No adenopathy or mass.   Breast Note: Left breast and left axillary incisions are healing nicely. Excellent cosmetic result. No hematoma or infection. Edema of the areola getting better. Excellent range of motion of her shoulder.   Cardiovascular Note: Regular rate and rhythm. No murmur. No ectopy.     Assessment & Plan  PRIMARY CANCER OF UPPER INNER QUADRANT OF LEFT FEMALE BREAST (174.2  C50.212) Current Plans  Schedule for Surgery You are recovering from your left partial mastectomy, 2 areas, without any surgical or wound problems. We will over all of your pathology reports again today. Your invasive cancer was completely removed and was only 6 mm in size. We found low-grade  ductal carcinoma in situ in several areas of both of the breast specimens both in the upper inner quadrant and in the central area. In one area, the DCIS involved the lateral margin. A few  other margins are very close. The sentinel lymph nodes are negative which is good news. We talked once again about her options of reexcision of margins and radiation therapy, mastectomy with or without reconstruction, and holding off on surgery until you got a second opinion You told me that you have an appointment with Dr. Valere Dross this week but that she wanted to go ahead and have reexcision of margins. You stated that you had thought more about this and wanted to do everything you could to stay within the standard of care. We talked about techniques and risks of this surgery in detail. We talked about the low but possible definite possibility of having continued positive margins following a second excision., which would indicate a more extensive DCIS situation. We will schedule the surgery in the near future.  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 Melissa Holloway, M.D., [facility] General and Minimally invasive Surgery Breast and Colorectal Surgery Office:   [phone] Pager:   [phone]

## 2014-11-27 ENCOUNTER — Encounter (HOSPITAL_BASED_OUTPATIENT_CLINIC_OR_DEPARTMENT_OTHER): Payer: Self-pay | Admitting: *Deleted

## 2014-11-27 ENCOUNTER — Ambulatory Visit (HOSPITAL_BASED_OUTPATIENT_CLINIC_OR_DEPARTMENT_OTHER): Payer: Medicare Other | Admitting: Anesthesiology

## 2014-11-27 ENCOUNTER — Ambulatory Visit (HOSPITAL_BASED_OUTPATIENT_CLINIC_OR_DEPARTMENT_OTHER)
Admission: RE | Admit: 2014-11-27 | Discharge: 2014-11-27 | Disposition: A | Discharge status: Home / Self Care | Payer: Medicare Other | Source: Ambulatory Visit | Attending: General Surgery | Admitting: General Surgery

## 2014-11-27 ENCOUNTER — Encounter (HOSPITAL_BASED_OUTPATIENT_CLINIC_OR_DEPARTMENT_OTHER)
Admission: RE | Disposition: A | Discharge status: Home / Self Care | Payer: Self-pay | Source: Ambulatory Visit | Attending: General Surgery

## 2014-11-27 DIAGNOSIS — N62 Hypertrophy of breast: Secondary | ICD-10-CM | POA: Diagnosis not present

## 2014-11-27 DIAGNOSIS — Z7982 Long term (current) use of aspirin: Secondary | ICD-10-CM | POA: Insufficient documentation

## 2014-11-27 DIAGNOSIS — Z170 Estrogen receptor positive status [ER+]: Secondary | ICD-10-CM

## 2014-11-27 DIAGNOSIS — C50912 Malignant neoplasm of unspecified site of left female breast: Secondary | ICD-10-CM

## 2014-11-27 DIAGNOSIS — C50212 Malignant neoplasm of upper-inner quadrant of left female breast: Secondary | ICD-10-CM | POA: Insufficient documentation

## 2014-11-27 HISTORY — PX: RE-EXCISION OF BREAST LUMPECTOMY: SHX6048

## 2014-11-27 SURGERY — EXCISION, LESION, BREAST
Anesthesia: General | Site: Breast | Laterality: Left

## 2014-11-27 MED ORDER — MIDAZOLAM HCL 2 MG/2ML IJ SOLN
1.0000 mg | INTRAMUSCULAR | Status: DC | PRN
  Administered 2014-11-27: 2 mg via INTRAVENOUS

## 2014-11-27 MED ORDER — ACETAMINOPHEN 650 MG RE SUPP
650.0000 mg | RECTAL | Status: DC | PRN
Start: 2014-11-27 — End: 2014-11-27

## 2014-11-27 MED ORDER — OXYCODONE HCL 5 MG/5ML PO SOLN: 5.0000 mg | Freq: Once | ORAL | Status: DC | PRN

## 2014-11-27 MED ORDER — BUPIVACAINE-EPINEPHRINE (PF) 0.5% -1:200000 IJ SOLN
INTRAMUSCULAR | Status: AC
  Filled 2014-11-27: qty 30

## 2014-11-27 MED ORDER — SCOPOLAMINE 1 MG/3DAYS TD PT72: 1.0000 | MEDICATED_PATCH | TRANSDERMAL | Status: DC

## 2014-11-27 MED ORDER — IBUPROFEN 100 MG/5ML PO SUSP: 200.0000 mg | Freq: Four times a day (QID) | ORAL | Status: DC | PRN

## 2014-11-27 MED ORDER — GLYCOPYRROLATE 0.2 MG/ML IJ SOLN: 0.2000 mg | Freq: Once | INTRAMUSCULAR | Status: DC | PRN

## 2014-11-27 MED ORDER — FENTANYL CITRATE (PF) 100 MCG/2ML IJ SOLN: 25.0000 ug | INTRAMUSCULAR | Status: DC | PRN

## 2014-11-27 MED ORDER — OXYCODONE HCL 5 MG PO TABS: 5.0000 mg | ORAL_TABLET | Freq: Once | ORAL | Status: DC | PRN

## 2014-11-27 MED ORDER — LACTATED RINGERS IV SOLN
INTRAVENOUS | Status: DC
  Administered 2014-11-27 (×2): via INTRAVENOUS

## 2014-11-27 MED ORDER — BUPIVACAINE-EPINEPHRINE 0.5% -1:200000 IJ SOLN
INTRAMUSCULAR | Status: DC | PRN
  Administered 2014-11-27: 9 mL

## 2014-11-27 MED ORDER — HYDROCODONE-ACETAMINOPHEN 5-325 MG PO TABS: 1.0000 | ORAL_TABLET | Freq: Four times a day (QID) | ORAL | Status: DC | PRN

## 2014-11-27 MED ORDER — MIDAZOLAM HCL 2 MG/2ML IJ SOLN
INTRAMUSCULAR | Status: AC
  Filled 2014-11-27: qty 2

## 2014-11-27 MED ORDER — SODIUM CHLORIDE 0.9 % IV SOLN: 250.0000 mL | INTRAVENOUS | Status: DC | PRN

## 2014-11-27 MED ORDER — LIDOCAINE HCL (PF) 1 % IJ SOLN
INTRAMUSCULAR | Status: AC
  Filled 2014-11-27: qty 30

## 2014-11-27 MED ORDER — SODIUM CHLORIDE 0.9 % IJ SOLN: 3.0000 mL | Freq: Two times a day (BID) | INTRAMUSCULAR | Status: DC

## 2014-11-27 MED ORDER — FENTANYL CITRATE (PF) 100 MCG/2ML IJ SOLN
INTRAMUSCULAR | Status: DC | PRN
  Administered 2014-11-27: 100 ug via INTRAVENOUS

## 2014-11-27 MED ORDER — LIDOCAINE HCL (CARDIAC) 20 MG/ML IV SOLN
INTRAVENOUS | Status: DC | PRN
  Administered 2014-11-27: 80 mg via INTRAVENOUS

## 2014-11-27 MED ORDER — OXYCODONE HCL 5 MG PO TABS: 5.0000 mg | ORAL_TABLET | ORAL | Status: DC | PRN

## 2014-11-27 MED ORDER — HYDROMORPHONE HCL 1 MG/ML IJ SOLN
0.2500 mg | INTRAMUSCULAR | Status: DC | PRN
  Administered 2014-11-27 (×3): 0.25 mg via INTRAVENOUS

## 2014-11-27 MED ORDER — IBUPROFEN 200 MG PO TABS: 200.0000 mg | ORAL_TABLET | Freq: Four times a day (QID) | ORAL | Status: DC | PRN

## 2014-11-27 MED ORDER — FENTANYL CITRATE (PF) 100 MCG/2ML IJ SOLN
INTRAMUSCULAR | Status: AC
  Filled 2014-11-27: qty 6

## 2014-11-27 MED ORDER — PROPOFOL 10 MG/ML IV BOLUS
INTRAVENOUS | Status: AC
  Filled 2014-11-27: qty 20

## 2014-11-27 MED ORDER — CEFAZOLIN SODIUM-DEXTROSE 2-3 GM-% IV SOLR
2.0000 g | INTRAVENOUS | Status: AC
  Administered 2014-11-27: 2 g via INTRAVENOUS

## 2014-11-27 MED ORDER — SODIUM CHLORIDE 0.9 % IV SOLN: INTRAVENOUS | Status: DC

## 2014-11-27 MED ORDER — CEFAZOLIN SODIUM-DEXTROSE 2-3 GM-% IV SOLR
INTRAVENOUS | Status: AC
  Filled 2014-11-27: qty 50

## 2014-11-27 MED ORDER — ONDANSETRON HCL 4 MG/2ML IJ SOLN
INTRAMUSCULAR | Status: DC | PRN
  Administered 2014-11-27: 4 mg via INTRAVENOUS

## 2014-11-27 MED ORDER — KETOROLAC TROMETHAMINE 30 MG/ML IJ SOLN: 30.0000 mg | Freq: Once | INTRAMUSCULAR | Status: DC | PRN

## 2014-11-27 MED ORDER — DEXAMETHASONE SODIUM PHOSPHATE 4 MG/ML IJ SOLN
INTRAMUSCULAR | Status: DC | PRN
  Administered 2014-11-27: 10 mg via INTRAVENOUS

## 2014-11-27 MED ORDER — SCOPOLAMINE 1 MG/3DAYS TD PT72
MEDICATED_PATCH | TRANSDERMAL | Status: AC
  Filled 2014-11-27: qty 1

## 2014-11-27 MED ORDER — CHLORHEXIDINE GLUCONATE 4 % EX LIQD: 1.0000 "application " | Freq: Once | CUTANEOUS | Status: DC

## 2014-11-27 MED ORDER — LIDOCAINE-EPINEPHRINE (PF) 1 %-1:200000 IJ SOLN
INTRAMUSCULAR | Status: AC
  Filled 2014-11-27: qty 10

## 2014-11-27 MED ORDER — SODIUM CHLORIDE 0.9 % IJ SOLN: 3.0000 mL | INTRAMUSCULAR | Status: DC | PRN

## 2014-11-27 MED ORDER — FENTANYL CITRATE (PF) 100 MCG/2ML IJ SOLN: 50.0000 ug | INTRAMUSCULAR | Status: DC | PRN

## 2014-11-27 MED ORDER — ACETAMINOPHEN 325 MG PO TABS: 650.0000 mg | ORAL_TABLET | ORAL | Status: DC | PRN

## 2014-11-27 MED ORDER — PROPOFOL 10 MG/ML IV BOLUS
INTRAVENOUS | Status: DC | PRN
  Administered 2014-11-27: 200 mg via INTRAVENOUS

## 2014-11-27 MED ORDER — MEPERIDINE HCL 25 MG/ML IJ SOLN: 6.2500 mg | INTRAMUSCULAR | Status: DC | PRN

## 2014-11-27 MED ORDER — HYDROMORPHONE HCL 1 MG/ML IJ SOLN
INTRAMUSCULAR | Status: AC
  Filled 2014-11-27: qty 1

## 2014-11-27 SURGICAL SUPPLY — 64 items
ADH SKN CLS APL DERMABOND .7 (GAUZE/BANDAGES/DRESSINGS) ×2
APL SKNCLS STERI-STRIP NONHPOA (GAUZE/BANDAGES/DRESSINGS)
APPLIER CLIP 9.375 MED OPEN (MISCELLANEOUS)
APR CLP MED 9.3 20 MLT OPN (MISCELLANEOUS)
BANDAGE ELASTIC 6 VELCRO ST LF (GAUZE/BANDAGES/DRESSINGS) IMPLANT
BENZOIN TINCTURE PRP APPL 2/3 (GAUZE/BANDAGES/DRESSINGS) IMPLANT
BINDER BREAST XLRG (GAUZE/BANDAGES/DRESSINGS) ×2 IMPLANT
BLADE HEX COATED 2.75 (ELECTRODE) ×3 IMPLANT
BLADE SURG 15 STRL LF DISP TIS (BLADE) ×4 IMPLANT
BLADE SURG 15 STRL SS (BLADE) ×6
CANISTER SUCT 1200ML W/VALVE (MISCELLANEOUS) ×3 IMPLANT
CHLORAPREP W/TINT 26ML (MISCELLANEOUS) ×3 IMPLANT
CLIP APPLIE 9.375 MED OPEN (MISCELLANEOUS) ×1 IMPLANT
COVER BACK TABLE 60X90IN (DRAPES) ×3 IMPLANT
COVER MAYO STAND STRL (DRAPES) ×3 IMPLANT
DECANTER SPIKE VIAL GLASS SM (MISCELLANEOUS) IMPLANT
DERMABOND ADVANCED (GAUZE/BANDAGES/DRESSINGS) ×1
DERMABOND ADVANCED .7 DNX12 (GAUZE/BANDAGES/DRESSINGS) ×1 IMPLANT
DRAPE LAPAROSCOPIC ABDOMINAL (DRAPES) IMPLANT
DRAPE LAPAROTOMY 100X72 PEDS (DRAPES) ×3 IMPLANT
DRAPE LAPAROTOMY TRNSV 102X78 (DRAPE) IMPLANT
DRAPE UTILITY XL STRL (DRAPES) ×3 IMPLANT
DRSG PAD ABDOMINAL 8X10 ST (GAUZE/BANDAGES/DRESSINGS) ×2 IMPLANT
ELECT REM PT RETURN 9FT ADLT (ELECTROSURGICAL) ×3
ELECTRODE REM PT RTRN 9FT ADLT (ELECTROSURGICAL) ×2 IMPLANT
GAUZE SPONGE 4X4 16PLY XRAY LF (GAUZE/BANDAGES/DRESSINGS) IMPLANT
GLOVE BIO SURGEON STRL SZ 6.5 (GLOVE) ×2 IMPLANT
GLOVE BIOGEL PI IND STRL 7.0 (GLOVE) ×1 IMPLANT
GLOVE BIOGEL PI INDICATOR 7.0 (GLOVE) ×1
GLOVE EUDERMIC 7 POWDERFREE (GLOVE) ×5 IMPLANT
GOWN STRL REUS W/ TWL LRG LVL3 (GOWN DISPOSABLE) ×2 IMPLANT
GOWN STRL REUS W/ TWL XL LVL3 (GOWN DISPOSABLE) ×2 IMPLANT
GOWN STRL REUS W/TWL LRG LVL3 (GOWN DISPOSABLE) ×3
GOWN STRL REUS W/TWL XL LVL3 (GOWN DISPOSABLE) ×3
KIT MARKER MARGIN INK (KITS) IMPLANT
NDL HYPO 25X1 1.5 SAFETY (NEEDLE) ×1 IMPLANT
NEEDLE HYPO 22GX1.5 SAFETY (NEEDLE) IMPLANT
NEEDLE HYPO 25X1 1.5 SAFETY (NEEDLE) ×3 IMPLANT
NS IRRIG 1000ML POUR BTL (IV SOLUTION) ×3 IMPLANT
PACK BASIN DAY SURGERY FS (CUSTOM PROCEDURE TRAY) ×3 IMPLANT
PENCIL BUTTON HOLSTER BLD 10FT (ELECTRODE) ×3 IMPLANT
SLEEVE SCD COMPRESS KNEE MED (MISCELLANEOUS) ×3 IMPLANT
SPONGE GAUZE 4X4 12PLY STER LF (GAUZE/BANDAGES/DRESSINGS) ×2 IMPLANT
SPONGE LAP 4X18 X RAY DECT (DISPOSABLE) ×3 IMPLANT
STAPLER VISISTAT 35W (STAPLE) IMPLANT
STRIP CLOSURE SKIN 1/2X4 (GAUZE/BANDAGES/DRESSINGS) IMPLANT
SUT ETHILON 4 0 PS 2 18 (SUTURE) IMPLANT
SUT MNCRL AB 4-0 PS2 18 (SUTURE) IMPLANT
SUT SILK 2 0 SH (SUTURE) ×3 IMPLANT
SUT VIC AB 2-0 CT1 27 (SUTURE) ×3
SUT VIC AB 2-0 CT1 TAPERPNT 27 (SUTURE) ×2 IMPLANT
SUT VIC AB 2-0 SH 27 (SUTURE)
SUT VIC AB 2-0 SH 27XBRD (SUTURE) IMPLANT
SUT VIC AB 3-0 FS2 27 (SUTURE) IMPLANT
SUT VIC AB 4-0 P-3 18XBRD (SUTURE) IMPLANT
SUT VIC AB 4-0 P3 18 (SUTURE)
SUT VICRYL 3-0 CR8 SH (SUTURE) ×3 IMPLANT
SUT VICRYL 4-0 PS2 18IN ABS (SUTURE) IMPLANT
SYR BULB 3OZ (MISCELLANEOUS) IMPLANT
SYRINGE 10CC LL (SYRINGE) ×3 IMPLANT
TAPE HYPAFIX 4 X10 (GAUZE/BANDAGES/DRESSINGS) IMPLANT
TOWEL OR NON WOVEN STRL DISP B (DISPOSABLE) ×3 IMPLANT
TUBE CONNECTING 20X1/4 (TUBING) ×3 IMPLANT
YANKAUER SUCT BULB TIP NO VENT (SUCTIONS) ×3 IMPLANT

## 2014-11-27 NOTE — Anesthesia Procedure Notes (Signed)
Procedure Name: LMA Insertion Date/Time: 11/27/2014 12:53 PM Performed by: Lyndee Leo Pre-anesthesia Checklist: Patient identified, Emergency Drugs available, Suction available and Patient being monitored Patient Re-evaluated:Patient Re-evaluated prior to inductionOxygen Delivery Method: Circle System Utilized Preoxygenation: Pre-oxygenation with 100% oxygen Intubation Type: IV induction Ventilation: Mask ventilation without difficulty LMA: LMA inserted LMA Size: 4.0 Number of attempts: 1 Airway Equipment and Method: Bite block Placement Confirmation: positive ETCO2 Tube secured with: Tape Dental Injury: Teeth and Oropharynx as per pre-operative assessment

## 2014-11-27 NOTE — Discharge Instructions (Signed)
[facility] °Office Phone Number [phone] ° °BREAST BIOPSY/ PARTIAL MASTECTOMY: POST OP INSTRUCTIONS ° °Always review your discharge instruction sheet given to you by the facility where your surgery was performed. ° °IF YOU HAVE DISABILITY OR FAMILY LEAVE FORMS, YOU MUST BRING THEM TO THE OFFICE FOR PROCESSING.  DO NOT GIVE THEM TO YOUR DOCTOR. ° °1. A prescription for pain medication may be given to you upon discharge.  Take your pain medication as prescribed, if needed.  If narcotic pain medicine is not needed, then you may take acetaminophen (Tylenol) or ibuprofen (Advil) as needed. °2. Take your usually prescribed medications unless otherwise directed °3. If you need a refill on your pain medication, please contact your pharmacy.  They will contact our office to request authorization.  Prescriptions will not be filled after 5pm or on week-ends. °4. You should eat very light the first 24 hours after surgery, such as soup, crackers, pudding, etc.  Resume your normal diet the day after surgery. °5. Most patients will experience some swelling and bruising in the breast.  Ice packs and a good support bra will help.  Swelling and bruising can take several days to resolve.  °6. It is common to experience some constipation if taking pain medication after surgery.  Increasing fluid intake and taking a stool softener will usually help or prevent this problem from occurring.  A mild laxative (Milk of Magnesia or Miralax) should be taken according to package directions if there are no bowel movements after 48 hours. °7. Unless discharge instructions indicate otherwise, you may remove your bandages 24-48 hours after surgery, and you may shower at that time.  You may have steri-strips (small skin tapes) in place directly over the incision.  These strips should be left on the skin for 7-10 days.  If your surgeon used skin glue on the incision, you may shower in 24 hours.  The glue will flake off over the  next 2-3 weeks.  Any sutures or staples will be removed at the office during your follow-up visit. °8. ACTIVITIES:  You may resume regular daily activities (gradually increasing) beginning the next day.  Wearing a good support bra or sports bra minimizes pain and swelling.  You may have sexual intercourse when it is comfortable. °a. You may drive when you no longer are taking prescription pain medication, you can comfortably wear a seatbelt, and you can safely maneuver your car and apply brakes. °b. RETURN TO WORK:  ______________________________________________________________________________________ °9. You should see your doctor in the office for a follow-up appointment approximately two weeks after your surgery.  Your doctor’s nurse will typically make your follow-up appointment when she calls you with your pathology report.  Expect your pathology report 2-3 business days after your surgery.  You may call to check if you do not hear from us after three days. °10. OTHER INSTRUCTIONS: _______________________________________________________________________________________________ _____________________________________________________________________________________________________________________________________ °_____________________________________________________________________________________________________________________________________ °_____________________________________________________________________________________________________________________________________ ° °WHEN TO CALL YOUR DOCTOR: °1. Fever over 101.0 °2. Nausea and/or vomiting. °3. Extreme swelling or bruising. °4. Continued bleeding from incision. °5. Increased pain, redness, or drainage from the incision. ° °The clinic staff is available to answer your questions during regular business hours.  Please don’t hesitate to call and ask to speak to one of the nurses for clinical concerns.  If you have a medical emergency, go to the nearest  emergency room or call 911.  A surgeon from [facility] is always on call at the hospital. ° °For further questions, please visit [email or web address]  ° ° °  Post Anesthesia Home Care Instructions  Activity: Get plenty of rest for the remainder of the day. A responsible adult should stay with you for 24 hours following the procedure.  For the next 24 hours, DO NOT: -Drive a car -Paediatric nurse -Drink alcoholic beverages -Take any medication unless instructed by your physician -Make any legal decisions or sign important papers.  Meals: Start with liquid foods such as gelatin or soup. Progress to regular foods as tolerated. Avoid greasy, spicy, heavy foods. If nausea and/or vomiting occur, drink only clear liquids until the nausea and/or vomiting subsides. Call your physician if vomiting continues.  Special Instructions/Symptoms: Your throat may feel dry or sore from the anesthesia or the breathing tube placed in your throat during surgery. If this causes discomfort, gargle with warm salt water. The discomfort should disappear within 24 hours.  If you had a scopolamine patch placed behind your ear for the management of post- operative nausea and/or vomiting:  1. The medication in the patch is effective for 72 hours, after which it should be removed.  Wrap patch in a tissue and discard in the trash. Wash hands thoroughly with soap and water. 2. You may remove the patch earlier than 72 hours if you experience unpleasant side effects which may include dry mouth, dizziness or visual disturbances. 3. Avoid touching the patch. Wash your hands with soap and water after contact with the patch.   Call your surgeon if you experience:   1.  Fever over 101.0. 2.  Inability to urinate. 3.  Nausea and/or vomiting. 4.  Extreme swelling or bruising at the surgical site. 5.  Continued bleeding from the incision. 6.  Increased pain, redness or drainage from the incision. 7.  Problems  related to your pain medication. 8. Any change in color, movement and/or sensation 9. Any problems and/or concerns

## 2014-11-27 NOTE — Anesthesia Postprocedure Evaluation (Signed)
  Anesthesia Post-op Note  Patient: Melissa Holloway  Procedure(s) Performed: Procedure(s): LEFT PARTIAL MASTECTOMY RE-EXCISION MARGINS (Left)  Patient Location: PACU  Anesthesia Type: General   Level of Consciousness: awake, alert  and oriented  Airway and Oxygen Therapy: Patient Spontanous Breathing  Post-op Pain: mild  Post-op Assessment: Post-op Vital signs reviewed  Post-op Vital Signs: Reviewed  Last Vitals:  Filed Vitals:   11/27/14 1449  BP: 144/80  Pulse: 62  Temp: 36.2 C  Resp: 16    Complications: No apparent anesthesia complications

## 2014-11-27 NOTE — Transfer of Care (Signed)
Immediate Anesthesia Transfer of Care Note  Patient: Melissa Holloway  Procedure(s) Performed: Procedure(s): LEFT PARTIAL MASTECTOMY RE-EXCISION MARGINS (Left)  Patient Location: PACU  Anesthesia Type:General  Level of Consciousness: sedated, patient cooperative and responds to stimulation  Airway & Oxygen Therapy: Patient Spontanous Breathing and Patient connected to face mask oxygen  Post-op Assessment: Report given to RN and Post -op Vital signs reviewed and stable  Post vital signs: Reviewed and stable  Last Vitals:  Filed Vitals:   11/27/14 1059  BP: 132/68  Pulse: 76  Temp: 36.6 C  Resp: 18    Complications: No apparent anesthesia complications

## 2014-11-27 NOTE — Op Note (Signed)
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 Dalbert Batman, M.D., FACS  Assistant:                      Or staff  Operative Indications:   The patient is a 69 year old female who presents with breast cancer. She returns to see me for a second postop visit, states that she has reconsidered and now wants to do definitive surgery for her left breast cancer with positive margins. She has  seen Dr. Jana Hakim and Dr. Valere Dross. Marland Kitchen History is significant for left partial mastectomy with radioactive seed localization for invasive cancer, upper inner quadrant on October 18, 2014. She also underwent a second lumpectomy in a more central location for ADH.     Final pathology shows a 6 mm invasive carcinoma upper inner quadrant, completely excised. Both lumpectomy specimens, however showed low-grade DCIS. The DCIS involved the lateral margin of the upper specimen and several other margins are close, 2 mm or less. 2 sentinel lymph nodes are negative. Hormone receptors positive. HER-2 negative. Ki-67 10%. Stage TIb N0. She has no complaints about wound healing. She is happy with the cosmetic result and is very comfortable When I saw her on more October 26, 2014 she was opposed any further surgery and wanted to talk to Dr. Valere Dross and Dr. Jana Hakim about primary radiation therapy. She has changed her mind and wants to do whatever is necessary, including  a mastectomy. I told her that mastectomy would probably lower her recurrence rate but that I did not see that it had a survival advantage. We talked about lumpectomy mastectomy reconstruction. At the end of the lengthy discussion she decided to go ahead and schedule for left partial mastectomy with reexcision of margins. She knows that if she continues to have positive margins that she might need a mastectomy. We talked about indications, details, techniques, and numerous risk of the surgery with her. She is aware the risks of bleeding, infection, further volume loss and cosmetic deformity, continue parted positive margins requiring further surgery, and other unforeseen problems. She understands all these issues and all of her questions are answered. She agrees with this plan.  Operative Findings:       Deep within the central breast I found the lumpectomy cavities and the seroma cavities surrounding the upper inner  quadrant and the central lumpectomy site. There is no gross evidence of cancer. I reexcised the lateral margin of the upper inner quadrant lumpectomy site, the posterior margin of the upper inner quadrant lump ectomy site, and the lateral margin of the central lumpectomy site. The specimen was carefully inked and carefully labeled and sent separately to the pathology lab.  Procedure in Detail:          Following the induction of general anesthesia with LMA device the patient's left breast was prepped and draped in a sterile fashion. Intravenous antibiotics were given. Surgical timeout was performed. 0.5% Marcaine with epinephrine was used as local infiltration anesthetic.    I reopened the curvilinear circumareolar incision in the upper left breast. Dissection was carried down through the breast tissue until I encountered the lumpectomy cavities. I opened this up completely and then found that it wasfairly straightforward to reexcise the  3 margins described above. These were inked and  labeled and sent to the lab. Hemostasis was excellent and achieved  with electrocautery. The wound was irrigated with saline. I placed  metal clips to further mark the lumpectomy cavity and then closed the breast tissues carefully  in several layers with interrupted 2-0 Vicryl in interrupted 3-0 Vicryl and the skin was closed with a running subcuticular suture  of 4-0 Monocryl and Dermabond. Breast binder was placed and the patient taken to PACU in stable condition. EBL 25 mL. Counts correct. Complications none.     Edsel Petrin. Dalbert Batman, M.D., FACS General and Minimally Invasive Surgery Breast and Colorectal Surgery  11/27/2014 1:38 PM

## 2014-11-27 NOTE — Anesthesia Preprocedure Evaluation (Signed)
Anesthesia Evaluation  Patient identified by MRN, date of birth, ID band Patient awake    Reviewed: Allergy & Precautions, NPO status , Patient's Chart, lab work & pertinent test results  History of Anesthesia Complications (+) PONV  Airway Mallampati: I  TM Distance: >3 FB Neck ROM: Full    Dental  (+) Teeth Intact, Dental Advisory Given   Pulmonary  breath sounds clear to auscultation        Cardiovascular Rhythm:Regular Rate:Normal     Neuro/Psych    GI/Hepatic   Endo/Other    Renal/GU      Musculoskeletal   Abdominal   Peds  Hematology   Anesthesia Other Findings   Reproductive/Obstetrics                             Anesthesia Physical Anesthesia Plan  ASA: II  Anesthesia Plan: General   Post-op Pain Management:    Induction: Intravenous  Airway Management Planned: LMA  Additional Equipment:   Intra-op Plan:   Post-operative Plan: Extubation in OR  Informed Consent: I have reviewed the patients History and Physical, chart, labs and discussed the procedure including the risks, benefits and alternatives for the proposed anesthesia with the patient or authorized representative who has indicated his/her understanding and acceptance.   Dental advisory given  Plan Discussed with: CRNA, Anesthesiologist and Surgeon  Anesthesia Plan Comments:         Anesthesia Quick Evaluation

## 2014-11-27 NOTE — Interval H&P Note (Signed)
History and Physical Interval Note:  11/27/2014 12:20 PM  Melissa Holloway  has presented today for surgery, with the diagnosis of left breast cancer  The various methods of treatment have been discussed with the patient and family. After consideration of risks, benefits and other options for treatment, the patient has consented to  Procedure(s): LEFT PARTIAL MASTECTOMY RE-EXCISION MARGINS (Left) as a surgical intervention .  The patient's history has been reviewed, patient examined, no change in status, stable for surgery.  I have reviewed the patient's chart and labs.  Questions were answered to the patient's satisfaction.     Adin Hector

## 2014-11-28 ENCOUNTER — Encounter (HOSPITAL_BASED_OUTPATIENT_CLINIC_OR_DEPARTMENT_OTHER): Payer: Self-pay | Admitting: General Surgery

## 2014-11-30 NOTE — Progress Notes (Signed)
Quick Note:  Inform patient of Pathology report,. Tell her that the margins are negative and I do not plan any further surgery. Tell her that we did find ductal carcinoma in situ in 2 of the 3 specimens that we sent. One of the specimens has a 1 mm margin. I do not think that further surgery will benefit and that we should proceed to radiation therapy. I'll be happy to discuss with her at her next office visit or today if she so desires.  hmi ______

## 2014-12-15 ENCOUNTER — Encounter (HOSPITAL_BASED_OUTPATIENT_CLINIC_OR_DEPARTMENT_OTHER): Payer: Self-pay | Admitting: General Surgery

## 2014-12-20 ENCOUNTER — Encounter: Payer: Self-pay | Admitting: *Deleted

## 2014-12-20 NOTE — Progress Notes (Signed)
Received authorization number for oncotype: [id number]

## 2014-12-21 ENCOUNTER — Encounter (HOSPITAL_COMMUNITY): Payer: Self-pay

## 2014-12-28 ENCOUNTER — Ambulatory Visit (HOSPITAL_BASED_OUTPATIENT_CLINIC_OR_DEPARTMENT_OTHER): Payer: Medicare Other | Admitting: Oncology

## 2014-12-28 ENCOUNTER — Ambulatory Visit
Admission: RE | Admit: 2014-12-28 | Discharge: 2014-12-28 | Disposition: A | Discharge status: Home / Self Care | Payer: Medicare Other | Source: Ambulatory Visit | Attending: Radiation Oncology | Admitting: Radiation Oncology

## 2014-12-28 ENCOUNTER — Telehealth: Payer: Self-pay | Admitting: Oncology

## 2014-12-28 ENCOUNTER — Encounter: Payer: Self-pay | Admitting: Radiation Oncology

## 2014-12-28 VITALS — BP 130/79 | HR 74 | Temp 98.3°F | Ht 63.0 in | Wt 189.9 lb

## 2014-12-28 VITALS — BP 142/71 | HR 67 | Temp 97.6°F | Resp 18 | Ht 63.0 in | Wt 189.6 lb

## 2014-12-28 DIAGNOSIS — C50912 Malignant neoplasm of unspecified site of left female breast: Secondary | ICD-10-CM | POA: Diagnosis not present

## 2014-12-28 DIAGNOSIS — D0512 Intraductal carcinoma in situ of left breast: Secondary | ICD-10-CM | POA: Diagnosis not present

## 2014-12-28 NOTE — Progress Notes (Signed)
[place]  [phone] [phone]) [phone]     ID: Melissa Holloway DOB: 06/23/1946  MR#: [id number]  TTS#:[id number]  Patient Care Team: Jonathon Jordan, MD as PCP - General (Family Medicine) PCP: Lilian Coma, MD GYN: SU: Fanny Skates MD OTHER MD: Frederik Pear MD, Lavonna Monarch MD  CHIEF COMPLAINT: early-stage estrogen receptor positive breast cancer  CURRENT TREATMENT: Pending adjuvant radiation   BREAST CANCER HISTORY: From the initial intake note:  Melissa Holloway had routine screening mammography with tomography [facility]. This showed a possible mass in the left breast. On 08/16/2014 left diagnostic mammography and ultrasonography found the breast density to be category B. In the upper inner quadrant of the left breast there was a 7 mm spiculated mass which was not palpable by exam. Ultrasound confirmed a 5 mm irregular to her than wide hypoechoic mass in the area in question. There was no left axillary adenopathy.  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  On 09/08/2014 the patient underwent bilateral breast MRI. This confirmed the biopsy-proven malignancy in the upper inner left breast and measured it at 0.8 cm. There was also an irregular enhancing mass in the lower outer left breast measuring 1 cm maximally. Also in the right breast there was an irregular enhancing mass in the lower outer quadrant measuring 0.6 cm. There was no abnormal adenopathy in either axilla and no internal mammary adenopathy. There were incidental cysts noted in the liver, previously imaged by ultrasound in January 2014.  Ultrasound of both breasts on 09/14/2014 failed to  identify the additionalsuspicious areas seen on MRI.accordingly the patient proceeded to bilateral MRI guided biopsies on 09/27/2014. In the right breast lower outer quadrant there was a fibroadenoma with no atypia or malignancy. In the left breast central biopsy there was atypical ductal and atypical lobular hyperplasia.  Her subsequent history is as detailed below  INTERVAL HISTORY: Melissa Holloway returns today for follow-up of her breast cancer. Since her last visit here she underwent left lumpectomy for the 2 suspicious areas in her breast. In the upper inner quadrant there was an invasive ductal carcinoma measuring 6 mm. It was grade 1. Repeat HER-2 testing was again negative, with a signals ratio of 0.88 and number per cell 1.50. The second area, in the left central breast, showed ductal carcinoma in situ, low-grade (SZA 16-01/09/2000)  The in situ carcinoma involved the lateral margin, with other margins close, so on 11/27/2014 she underwent further margin reexcision. Margins were now negative, but the left lateral margin remain very close, about 1 mm ([id number]). This situation was discussed at the multidisciplinary breast cancer conference 12/27/2014 when the patient's case was represented. It was felt that the area of the lateral margin was focally not broadly close. We also reviewed recent data that strongly suggests that when adjuvant radiation follows lumpectomy, the relationship between close margins an increased risk of local recurrence does not obtain. Accordingly no further surgery is planned.   Since her last visit here she also had an Oncotype DX sent, which predicted a 6% risk of recurrence within 10 years outside the breast if the patient's only systemic treatment was tamoxifen for 5 years. It also predicted no benefit from chemotherapy.  REVIEW  OF SYSTEMS: Melissa Holloway tolerated the surgery well, with no significant pain, fever or bleeding complications. A detailed review of systems today was  otherwise entirely stable  PAST MEDICAL HISTORY: Past Medical History  Diagnosis Date  . Esophageal hernia     s/p  . Breast cancer, left breast 08/23/2014  . Family history of breast cancer   . PONV (postoperative nausea and vomiting)   . Arthritis     PAST SURGICAL HISTORY: Past Surgical History  Procedure Laterality Date  . Esophageal hernia  10  . Tonsillectomy    . Total knee arthroplasty Left 01/09/2014    Procedure: LEFT TOTAL KNEE ARTHROPLASTY;  Surgeon: Kerin Salen, MD;  Location: [facility];  Service: Orthopedics;  Laterality: Left;  . Colonoscopy    . Radioactive seed guided mastectomy with axillary sentinel lymph node biopsy Left 10/18/2014    Procedure: LEFT PARTIAL MASTECTOMY AND 1 LEFT RADIOACTIVE SEED 1 NEEDLE LOCALIZATION LEFT AXILLARY SENTINAL NODE BIOPSY;  Surgeon: Fanny Skates, MD;  Location: [facility];  Service: General;  Laterality: Left;  . Re-excision of breast lumpectomy Left 11/27/2014    Procedure: LEFT PARTIAL LUMPECTOMY RE-EXCISION LATERAL AND POSTERIOR MARGINS;  Surgeon: Fanny Skates, MD;  Location: [facility];  Service: General;  Laterality: Left;    FAMILY HISTORY Family History  Problem Relation Age of Onset  . Breast cancer Mother 98    TAH/BSO; deceased 6  . Prostate cancer Brother 4    deceased 31  . Breast cancer Maternal Aunt 70    deceased 66  . Breast cancer Paternal Aunt 52    Currently 101  . Cancer Paternal Grandmother     unknown type; deceased 4s  the patient's father died at the age of 74 following a fall. The patient's mother was diagnosed with breast cancer around age 58. She died with Alzheimer's disease around age 33. The patient had 4 brothers, 3 sisters. One brother was diagnosed with prostate cancer at the age of 36. In addition a paternal aunt was diagnosed with breast cancer at age 74. She is not 68 years old. She has 6 daughters mostly in their 70s none of whom have had breast cancer. There  is no history of ovarian cancer in the family.  GYNECOLOGIC HISTORY:  No LMP recorded. Patient is postmenopausal. Menarche age 29, first live birth age 4. The patient is GX P1. The patient went through menopause around 2008. She did not take hormone replacement. She did take oral contraceptives remotely for more than a year with no complications  SOCIAL HISTORY:  Melissa Holloway is retired from regular. Her husband Melissa Holloway") is a retired Administrator. Their son [name] lives in [place] and works in Press photographer. The patient has one 28-monthold granddaughter and a grandson due 01/11/2015.    ADVANCED DIRECTIVES: not in place   HEALTH MAINTENANCE: History  Substance Use Topics  . Smoking status: Never Smoker   . Smokeless tobacco: Not on file  . Alcohol Use: No     Colonoscopy:  PAP:  Bone density:  Lipid panel:  No Known Allergies  Current Outpatient Prescriptions  Medication Sig Dispense Refill  . aspirin EC 81 MG tablet Take 81 mg by mouth daily.    .Marland Kitchenatorvastatin (LIPITOR) 20 MG tablet Take 20 mg by mouth daily.    . Loratadine (CLARITIN PO) Take 10 mg by mouth as needed.    .Marland KitchenPropylene Glycol (SYSTANE BALANCE OP) Place 1 drop into both eyes every morning.    .Marland Kitchen  vitamin C (ASCORBIC ACID) 500 MG tablet Take 500 mg by mouth daily.     No current facility-administered medications for this visit.    OBJECTIVE: middle-aged white woman in no acute distress Filed Vitals:   12/28/14 1143  BP: 142/71  Pulse: 67  Temp: 97.6 F (36.4 C)  Resp: 18     Body mass index is 33.59 kg/(m^2).    ECOG FS:0 - Asymptomatic  Sclerae unicteric, pupils round and equal Oropharynx clear and moist-- no thrush or other lesions No cervical or supraclavicular adenopathy Lungs no rales or rhonchi Heart regular rate and rhythm Abd soft, nontender, positive bowel sounds MSK no focal spinal tenderness, no upper extremity lymphedema Neuro: nonfocal, well oriented, appropriate affect Breasts: The right  breast is status post recent surgery. The incisions are healing nicely, with no dehiscence, erythema, or swelling. There is no evidence of residual or recurrent disease. The right axilla is benign per the left breast is unremarkable.   LAB RESULTS:  CMP     Component Value Date/Time   NA 143 10/10/2014 1554   NA 141 01/03/2014 1030   K 4.0 10/10/2014 1554   K 3.9 01/03/2014 1030   CL 103 01/03/2014 1030   CO2 28 10/10/2014 1554   CO2 26 01/03/2014 1030   GLUCOSE 108 10/10/2014 1554   GLUCOSE 95 01/03/2014 1030   BUN 16.2 10/10/2014 1554   BUN 13 01/03/2014 1030   CREATININE 0.7 10/10/2014 1554   CREATININE 0.62 01/03/2014 1030   CALCIUM 10.0 10/10/2014 1554   CALCIUM 9.6 01/03/2014 1030   PROT 7.2 10/10/2014 1554   PROT 7.4 04/29/2010 0958   ALBUMIN 4.0 10/10/2014 1554   ALBUMIN 4.4 04/29/2010 0958   AST 25 10/10/2014 1554   AST 22 04/29/2010 0958   ALT 25 10/10/2014 1554   ALT 18 04/29/2010 0958   ALKPHOS 103 10/10/2014 1554   ALKPHOS 77 04/29/2010 0958   BILITOT 0.53 10/10/2014 1554   BILITOT 0.5 04/29/2010 0958   GFRNONAA >90 01/03/2014 1030   GFRAA >90 01/03/2014 1030    INo results found for: SPEP, UPEP  Lab Results  Component Value Date   WBC 6.2 10/10/2014   NEUTROABS 3.7 10/10/2014   HGB 14.4 10/10/2014   HCT 43.9 10/10/2014   MCV 87.5 10/10/2014   PLT 262 10/10/2014      Chemistry      Component Value Date/Time   NA 143 10/10/2014 1554   NA 141 01/03/2014 1030   K 4.0 10/10/2014 1554   K 3.9 01/03/2014 1030   CL 103 01/03/2014 1030   CO2 28 10/10/2014 1554   CO2 26 01/03/2014 1030   BUN 16.2 10/10/2014 1554   BUN 13 01/03/2014 1030   CREATININE 0.7 10/10/2014 1554   CREATININE 0.62 01/03/2014 1030      Component Value Date/Time   CALCIUM 10.0 10/10/2014 1554   CALCIUM 9.6 01/03/2014 1030   ALKPHOS 103 10/10/2014 1554   ALKPHOS 77 04/29/2010 0958   AST 25 10/10/2014 1554   AST 22 04/29/2010 0958   ALT 25 10/10/2014 1554   ALT 18  04/29/2010 0958   BILITOT 0.53 10/10/2014 1554   BILITOT 0.5 04/29/2010 0958       No results found for: LABCA2  No components found for: LABCA125  No results for input(s): INR in the last 168 hours.  Urinalysis    Component Value Date/Time   COLORURINE YELLOW 01/03/2014 [place] 01/03/2014 1042   LABSPEC 1.012  01/03/2014 1042   PHURINE 6.0 01/03/2014 1042   GLUCOSEU NEGATIVE 01/03/2014 1042   HGBUR NEGATIVE 01/03/2014 1042   BILIRUBINUR NEGATIVE 01/03/2014 1042   KETONESUR NEGATIVE 01/03/2014 1042   PROTEINUR NEGATIVE 01/03/2014 1042   UROBILINOGEN 0.2 01/03/2014 1042   NITRITE NEGATIVE 01/03/2014 1042   LEUKOCYTESUR TRACE* 01/03/2014 1042    STUDIES: No results found.  ASSESSMENT: 69 y.o. [place] woman status post left breast upper inner quadrant biopsy 08/31/2014 for a clinical T1b N0, stage IA invasive ductal carcinoma, grade 1, strongly estrogen and progesterone receptor positive, HER-2 not amplified, with an MIB-1 of 10%  (1) MRI biopsy of 2 additional lesions showed  (a) in the left central breast, atypical lobular hyperplasia and atypical ductal hyperplasia  (b) in the right breast lower outer quadrant a fibroadenoma   (2) dual lumpectomy 10/18/2014 showed  (a) labeled "left upper inner quadrant", apT1b pN0, stage IA invasive ductal carcinoma, grade 1, and again HER-2 negative  (b) labeled "left central", ductal carcinoma in situ, low-grade, with a positive lateral and other close margins   (3) additional surgery for margin clearance for 20 12/22/2014 was successful, although the lateral margin remained close at 1 mm  (4) Oncotype DX score of 9 predicts an outside the breast risk of recurrence within 10 years of 6% if the patient's only systemic treatment is tamoxifen for 5 years. It also predicts no benefit from chemotherapy.   (5)  adjuvant radiation  pending   (6) anastrozole to follow radiation  PLAN: We went over Melissa Holloway is surgical  results and also the meaning of her Oncotype. She understands that to complete her local treatment she needs adjuvant radiation. This is being operationalized.  The Oncotype predicts a 6% risk of disease recurrence outside the breast within 10 years if her only systemic treatment is tamoxifen. It suggests essentially no benefit from chemotherapy in terms of risk reduction  We discussed the fact that 10 years of tamoxifen, 5 years of an aromatase inhibitor, or a mixed regimen including 2 years of an aromatase inhibitors and 3 years of tamoxifen, for example, are all superior to 5 years of tamoxifen. At this point, with a normal baseline bone density, I think she would be an excellent candidate for anastrozole and the plan will be for that to start once she recovers from the radiation treatments.  Once she has established she will tolerate anastrozole, we can complete template the PALLAS study.  Kashlynn has a good understanding of the overall plan. She agrees with it. She knows the goal of treatment in her case is cure. She will call with any problems that may develop before her next visit here.  Chauncey Cruel, MD   12/28/2014 12:02 PM Medical Oncology and Hematology [facility] [address] [place], [place] [address] Tel. [phone]    Fax. [phone]

## 2014-12-28 NOTE — Telephone Encounter (Signed)
Appointments made and avs printed for patient  Melissa Holloway

## 2014-12-28 NOTE — Progress Notes (Signed)
CC: Melissa Holloway  Follow-up note:  Diagnosis: Clinical stage I A (T1b N0 M0) invasive ductal/DCIS of the left breast, multicentric  Narrative: Melissa Holloway is a most pleasant 69 year old female who is seen today for review and scheduling of radiation therapy in the management of T1b N0 invasive ductal/DCIS of the left breast.  I first saw the patient in consultation on 11/09/2014. She was seen by Dr. Dalbert Batman on 09/12/2014 with a newly diagnosed invasive ductal carcinoma of left breast within the upper inner quadrant. Screening mammography showed a 0.7 cm mass within the upper inner quadrant at 11:00.There were no calcifications.  Image guided biopsy showed invasive ductal carcinoma, grade 1, receptor positive, HER-2/neu negative. Breast MR showed a 1 cm mass in the upper inner quadrant of left breast and also 1.0 cm mass in the lower outer quadrant of the left breast and a 6 mm mass in the lower outer quadrant of the right breast. There was no adenopathy. The upper inner quadrant left breast mass was diagnostic for invasive ductal carcinoma, grade 1, receptor positive, HER-2/neu negative. The lower outer quadrant lesion in the left breast showed atypical ductal hyperplasia. The lesion within the right breast, lower outer quadrant was biopsied and showed a benign fibroadenoma. On 10/18/2014 she underwent excision of the left upper inner quadrant mass which showed a 0.6 cm invasive ductal carcinoma along with low-grade DCIS. The DCIS involved the lateral margin and also came extremely close to the posterior margin, less than 0.1 cm. There was also low-grade DCIS close to the superior and inferior margins, approximately 0.2 cm. The lower breast lesion showed low-grade DCIS with lobular carcinoma in situ. This also came extremely close, less than 0.1 cm to the lateral margin and 0.2 cm to the medial margin. 2 lymph nodes were free of metastatic disease.  She underwent left breast reexcision on 11/27/2014.   She was found to have residual DCIS 1 mm from the left lateral margin with an additional excision without evidence for residual DCIS.  There was also DCIS 6 mm from the posterior surgical margin.  She is doing well postoperatively.  She will receive antiestrogen adjuvant therapy following completion of radiation therapy.  Physical examination: Alert and oriented. Filed Vitals:   12/28/14 1038  BP: 130/79  Pulse: 74  Temp: 98.3 F (36.8 C)   Nodes: Without palpable cervical, supraclavicular, or axillary lymphadenopathy.  Breasts: There is a partial mastectomy wound center along the upper inner quadrant of the left breast extending from 9 to 1:00.  The wound is healing well.  Cosmesis is remarkably good.  No masses are appreciated.  Extremities: Without edema.  Impression: Surgical stage IA (T1b N0 M0) invasive ductal/DCIS of the left breast.  We again discussed the potential acute and late toxicities of radiation therapy.  She desires breast preservation.  Consent is signed today.  We will have her return for CT simulation early next week.  Plan: As above.  30 minutes was spent face-to-face with the patient, primarily counseling patient and coordinating her care.

## 2014-12-28 NOTE — Addendum Note (Signed)
Encounter addended by: Benn Moulder, RN on: 12/28/2014  3:35 PM<BR>     Documentation filed: Charges VN

## 2015-01-02 ENCOUNTER — Ambulatory Visit: Payer: Medicare Other | Admitting: Oncology

## 2015-01-02 ENCOUNTER — Ambulatory Visit
Admission: RE | Admit: 2015-01-02 | Discharge: 2015-01-02 | Disposition: A | Discharge status: Home / Self Care | Payer: Medicare Other | Source: Ambulatory Visit | Attending: Radiation Oncology | Admitting: Radiation Oncology

## 2015-01-02 DIAGNOSIS — C50912 Malignant neoplasm of unspecified site of left female breast: Secondary | ICD-10-CM

## 2015-01-02 NOTE — Progress Notes (Signed)
Complex simulation/treatment planning note: The patient was taken to the CT simulator.  She was placed supine.  A custom Vac lock was constructed on a custom breast board.  Her left breast borders were marked with radiopaque wires in addition to her left partial mastectomy scar.  She was scanned free breathing.  It was determined that she would benefit from deep inspiration breath-hold and she was rescanned with deep inspiration breath-hold.  The CT data sets were sent to the planning system where I contoured her tumor bed on both CT data sets.  She was set up to medial and lateral breast tangents with deep inspiration breath-hold and 2 separate multileaf collimator's were designed.  I prescribing 5000 cGy in 25 sessions.  She will then undergo a boost for a further 5 fractions.  She is now ready for 3-D simulation.

## 2015-01-03 ENCOUNTER — Encounter: Payer: Self-pay | Admitting: Radiation Oncology

## 2015-01-03 DIAGNOSIS — C50912 Malignant neoplasm of unspecified site of left female breast: Secondary | ICD-10-CM | POA: Diagnosis not present

## 2015-01-03 NOTE — Progress Notes (Signed)
3-D simulation note: The patient completed 3-D simulation for treatment to her left breast with deep inspiration breath-hold.  Dose volume histograms were obtained for the heart and lungs.  We met our departmental guidelines.  She is set up to medial and lateral left breast tangents with 2 unique sets of MLCs, and 2 unique electronic compensation fields for a total of 4 complex treatment devices.  I am prescribing 5000 cGy in 25 sessions utilizing mixed 10 MV/6 MV photons.  This will be followed by a boost of 1000 cGy in 5 sessions utilizing electrons or photons.

## 2015-01-09 ENCOUNTER — Ambulatory Visit
Admission: RE | Admit: 2015-01-09 | Discharge: 2015-01-09 | Disposition: A | Discharge status: Home / Self Care | Payer: Medicare Other | Source: Ambulatory Visit | Attending: Radiation Oncology | Admitting: Radiation Oncology

## 2015-01-09 DIAGNOSIS — C50912 Malignant neoplasm of unspecified site of left female breast: Secondary | ICD-10-CM | POA: Diagnosis not present

## 2015-01-10 ENCOUNTER — Ambulatory Visit
Admission: RE | Admit: 2015-01-10 | Discharge: 2015-01-10 | Disposition: A | Discharge status: Home / Self Care | Payer: Medicare Other | Source: Ambulatory Visit | Attending: Radiation Oncology | Admitting: Radiation Oncology

## 2015-01-10 DIAGNOSIS — C50912 Malignant neoplasm of unspecified site of left female breast: Secondary | ICD-10-CM | POA: Diagnosis not present

## 2015-01-11 ENCOUNTER — Ambulatory Visit
Admission: RE | Admit: 2015-01-11 | Discharge: 2015-01-11 | Disposition: A | Discharge status: Home / Self Care | Payer: Medicare Other | Source: Ambulatory Visit | Attending: Radiation Oncology | Admitting: Radiation Oncology

## 2015-01-11 DIAGNOSIS — C50912 Malignant neoplasm of unspecified site of left female breast: Secondary | ICD-10-CM | POA: Diagnosis not present

## 2015-01-11 MED ORDER — RADIAPLEXRX EX GEL
Freq: Once | CUTANEOUS | Status: AC
  Administered 2015-01-11: 12:00:00 via TOPICAL

## 2015-01-11 MED ORDER — ALRA NON-METALLIC DEODORANT (RAD-ONC)
1.0000 "application " | Freq: Once | TOPICAL | Status: AC
  Administered 2015-01-11: 1 via TOPICAL

## 2015-01-11 NOTE — Progress Notes (Signed)
Pt education done, radiation therapy and you book, alra ,radiaplex gel given, flyer on skin products, use dove unscented soap recommended, discussed ways to manage side effects of fatigue,skin irritation, pain, swelling in breast,, to the breast, use radiaplex after treatnment daily and bedtime, alra after rad tx and prn, eat more protein in diet, drink plenty water,stay hydrated, luke warm shower, sees MD weekly and prn,  Verbal understanding, teach back given

## 2015-01-12 ENCOUNTER — Ambulatory Visit
Admission: RE | Admit: 2015-01-12 | Discharge: 2015-01-12 | Disposition: A | Discharge status: Home / Self Care | Payer: Medicare Other | Source: Ambulatory Visit | Attending: Radiation Oncology | Admitting: Radiation Oncology

## 2015-01-12 DIAGNOSIS — C50912 Malignant neoplasm of unspecified site of left female breast: Secondary | ICD-10-CM | POA: Diagnosis not present

## 2015-01-15 ENCOUNTER — Ambulatory Visit
Admission: RE | Admit: 2015-01-15 | Discharge: 2015-01-15 | Disposition: A | Discharge status: Home / Self Care | Payer: Medicare Other | Source: Ambulatory Visit | Attending: Radiation Oncology | Admitting: Radiation Oncology

## 2015-01-15 ENCOUNTER — Encounter: Payer: Self-pay | Admitting: Radiation Oncology

## 2015-01-15 VITALS — BP 129/56 | HR 73 | Temp 98.3°F | Ht 63.0 in | Wt 193.4 lb

## 2015-01-15 DIAGNOSIS — C50912 Malignant neoplasm of unspecified site of left female breast: Secondary | ICD-10-CM | POA: Diagnosis not present

## 2015-01-15 NOTE — Progress Notes (Signed)
Melissa Holloway has received 4 fractions to her left breast. Skin unchanged from initial presentation prior to start of treatment.

## 2015-01-15 NOTE — Progress Notes (Signed)
   Weekly Management Note:  Outpatient    ICD-9-CM ICD-10-CM   1. Breast cancer, left breast 174.9 C50.912     Current Dose:  8 Gy  Projected Dose: 60 Gy   Narrative:  The patient presents for routine under treatment assessment.  CBCT/MVCT images/Port film x-rays were reviewed.  The chart was checked. Skin unchanged from initial presentation prior to start of treatment.     Physical Findings:  height is 5\' 3"  (1.6 m) and weight is 193 lb 6.4 oz (87.726 kg). Her temperature is 98.3 F (36.8 C). Her blood pressure is 129/56 and her pulse is 73.   Wt Readings from Last 3 Encounters:  01/15/15 193 lb 6.4 oz (87.726 kg)  12/28/14 189 lb 9.6 oz (86.002 kg)  12/28/14 189 lb 14.4 oz (86.138 kg)   No skin changes present thus far.  Impression:  The patient is tolerating radiotherapy.  Plan:  Continue radiotherapy as planned.    This document serves as a record of services personally performed by Eppie Gibson, MD. It was created on her behalf by Arlyce Harman, a trained medical scribe. The creation of this record is based on the scribe's personal observations and the provider's statements to them. This document has been checked and approved by the attending provider. ________________________________   Eppie Gibson, M.D.

## 2015-01-16 ENCOUNTER — Ambulatory Visit
Admission: RE | Admit: 2015-01-16 | Discharge: 2015-01-16 | Disposition: A | Discharge status: Home / Self Care | Payer: Medicare Other | Source: Ambulatory Visit | Attending: Radiation Oncology | Admitting: Radiation Oncology

## 2015-01-16 DIAGNOSIS — C50912 Malignant neoplasm of unspecified site of left female breast: Secondary | ICD-10-CM | POA: Diagnosis not present

## 2015-01-17 ENCOUNTER — Ambulatory Visit
Admission: RE | Admit: 2015-01-17 | Discharge: 2015-01-17 | Disposition: A | Discharge status: Home / Self Care | Payer: Medicare Other | Source: Ambulatory Visit | Attending: Radiation Oncology | Admitting: Radiation Oncology

## 2015-01-17 DIAGNOSIS — C50912 Malignant neoplasm of unspecified site of left female breast: Secondary | ICD-10-CM | POA: Diagnosis not present

## 2015-01-18 ENCOUNTER — Ambulatory Visit
Admission: RE | Admit: 2015-01-18 | Discharge: 2015-01-18 | Disposition: A | Discharge status: Home / Self Care | Payer: Medicare Other | Source: Ambulatory Visit | Attending: Radiation Oncology | Admitting: Radiation Oncology

## 2015-01-18 DIAGNOSIS — C50912 Malignant neoplasm of unspecified site of left female breast: Secondary | ICD-10-CM | POA: Diagnosis not present

## 2015-01-19 ENCOUNTER — Ambulatory Visit
Admission: RE | Admit: 2015-01-19 | Discharge: 2015-01-19 | Disposition: A | Discharge status: Home / Self Care | Payer: Medicare Other | Source: Ambulatory Visit | Attending: Radiation Oncology | Admitting: Radiation Oncology

## 2015-01-19 DIAGNOSIS — C50912 Malignant neoplasm of unspecified site of left female breast: Secondary | ICD-10-CM | POA: Diagnosis not present

## 2015-01-22 ENCOUNTER — Ambulatory Visit
Admission: RE | Admit: 2015-01-22 | Discharge: 2015-01-22 | Disposition: A | Discharge status: Home / Self Care | Payer: Medicare Other | Source: Ambulatory Visit | Attending: Radiation Oncology | Admitting: Radiation Oncology

## 2015-01-22 DIAGNOSIS — C50912 Malignant neoplasm of unspecified site of left female breast: Secondary | ICD-10-CM | POA: Diagnosis not present

## 2015-01-22 NOTE — Progress Notes (Signed)
Melissa Holloway has received 9 fractions to her left breast.  Presents with rash-like appearance in the upper inner portion of her left breat, the axilla and in the inframmary fold.  Reports intermittent itching in these areas.  Denies fatigue.

## 2015-01-22 NOTE — Progress Notes (Signed)
Weekly Management Note:  Site: left breast Current Dose:   1800  cGy Projected Dose:  5000  cGy  Followed by 5 fraction boost  Narrative: The patient is seen today for routine under treatment assessment. CBCT/MVCT images/port films were reviewed. The chart was reviewed.    She is without complaints today although she does have a papular dermatitis along the upper inner aspect of her left breast. She uses Radioplex gel.  Physical Examination: There were no vitals filed for this visit..  Weight:  .  Faint erythema of the left breast with a papular dermatitis along the upper inner quadrant (sun exposed skin). No areas of desquamation.  Impression: Tolerating radiation therapy well.  I told she may use hydrocortisone cream should she develop pruritus.  Plan: Continue radiation therapy as planned.

## 2015-01-23 ENCOUNTER — Ambulatory Visit
Admission: RE | Admit: 2015-01-23 | Discharge: 2015-01-23 | Disposition: A | Discharge status: Home / Self Care | Payer: Medicare Other | Source: Ambulatory Visit | Attending: Radiation Oncology | Admitting: Radiation Oncology

## 2015-01-23 DIAGNOSIS — C50912 Malignant neoplasm of unspecified site of left female breast: Secondary | ICD-10-CM | POA: Diagnosis not present

## 2015-01-24 ENCOUNTER — Ambulatory Visit
Admission: RE | Admit: 2015-01-24 | Discharge: 2015-01-24 | Disposition: A | Discharge status: Home / Self Care | Payer: Medicare Other | Source: Ambulatory Visit | Attending: Radiation Oncology | Admitting: Radiation Oncology

## 2015-01-24 DIAGNOSIS — C50912 Malignant neoplasm of unspecified site of left female breast: Secondary | ICD-10-CM | POA: Diagnosis not present

## 2015-01-25 ENCOUNTER — Ambulatory Visit
Admission: RE | Admit: 2015-01-25 | Discharge: 2015-01-25 | Disposition: A | Discharge status: Home / Self Care | Payer: Medicare Other | Source: Ambulatory Visit | Attending: Radiation Oncology | Admitting: Radiation Oncology

## 2015-01-25 DIAGNOSIS — C50912 Malignant neoplasm of unspecified site of left female breast: Secondary | ICD-10-CM | POA: Diagnosis not present

## 2015-01-26 ENCOUNTER — Ambulatory Visit
Admission: RE | Admit: 2015-01-26 | Discharge: 2015-01-26 | Disposition: A | Discharge status: Home / Self Care | Payer: Medicare Other | Source: Ambulatory Visit | Attending: Radiation Oncology | Admitting: Radiation Oncology

## 2015-01-26 DIAGNOSIS — C50912 Malignant neoplasm of unspecified site of left female breast: Secondary | ICD-10-CM | POA: Diagnosis not present

## 2015-01-29 ENCOUNTER — Other Ambulatory Visit: Payer: Self-pay

## 2015-01-29 ENCOUNTER — Ambulatory Visit
Admission: RE | Admit: 2015-01-29 | Discharge: 2015-01-29 | Disposition: A | Discharge status: Home / Self Care | Payer: Medicare Other | Source: Ambulatory Visit | Attending: Radiation Oncology | Admitting: Radiation Oncology

## 2015-01-29 ENCOUNTER — Encounter: Payer: Self-pay | Admitting: Radiation Oncology

## 2015-01-29 VITALS — BP 130/65 | HR 69 | Temp 97.8°F | Resp 20 | Wt 194.5 lb

## 2015-01-29 DIAGNOSIS — C50912 Malignant neoplasm of unspecified site of left female breast: Secondary | ICD-10-CM

## 2015-01-29 NOTE — Progress Notes (Signed)
Weekly rad txs left breast 14/30 completed, rash like on breast and starting under axilla, c/o slight itching, not using cortisone cream as yet,radiaplex bid, appetite great no fatiguie or pain 8:41 AM BP 130/65 mmHg  Pulse 69  Temp(Src) 97.8 F (36.6 C) (Oral)  Resp 20  Wt 194 lb 8 oz (88.225 kg)  Wt Readings from Last 3 Encounters:  01/29/15 194 lb 8 oz (88.225 kg)  01/15/15 193 lb 6.4 oz (87.726 kg)  12/28/14 189 lb 9.6 oz (86.002 kg)   Gaspar Garbe, RN II Rad/Onc

## 2015-01-29 NOTE — Progress Notes (Signed)
Weekly Management Note:  Site: Left breast  Current Dose:   2800 cGy Projected Dose: 5000  cGy followed by left breast boost  Narrative: The patient is seen today for routine under treatment assessment. CBCT/MVCT images/port films were reviewed. The chart was reviewed.   She is developing a papular dermatitis along the upper inner quadrant of her left breast.  No significant pruritus.  She uses Radioplex gel.  Physical Examination:  Filed Vitals:   01/29/15 0839  BP: 130/65  Pulse: 69  Temp: 97.8 F (36.6 C)  Resp: 20  .  Weight: 194 lb 8 oz (88.225 kg).  There is a mild papular dermatitis along the upper quadrant of her left breast.  There is mild erythema elsewhere.  No areas of desquamation.  Impression: Tolerating radiation therapy well, although she does have a papular dermatitis.  She will continue with her Radioplex gel.  I told her that she could use hydrocortisone cream for any pruritus.  Plan: Continue radiation therapy as planned.

## 2015-01-30 ENCOUNTER — Ambulatory Visit
Admission: RE | Admit: 2015-01-30 | Discharge: 2015-01-30 | Disposition: A | Discharge status: Home / Self Care | Payer: Medicare Other | Source: Ambulatory Visit | Attending: Radiation Oncology | Admitting: Radiation Oncology

## 2015-01-30 DIAGNOSIS — C50912 Malignant neoplasm of unspecified site of left female breast: Secondary | ICD-10-CM | POA: Diagnosis not present

## 2015-01-31 ENCOUNTER — Ambulatory Visit
Admission: RE | Admit: 2015-01-31 | Discharge: 2015-01-31 | Disposition: A | Discharge status: Home / Self Care | Payer: Medicare Other | Source: Ambulatory Visit | Attending: Radiation Oncology | Admitting: Radiation Oncology

## 2015-01-31 DIAGNOSIS — C50912 Malignant neoplasm of unspecified site of left female breast: Secondary | ICD-10-CM | POA: Diagnosis not present

## 2015-02-01 ENCOUNTER — Ambulatory Visit
Admission: RE | Admit: 2015-02-01 | Discharge: 2015-02-01 | Disposition: A | Discharge status: Home / Self Care | Payer: Medicare Other | Source: Ambulatory Visit | Attending: Radiation Oncology | Admitting: Radiation Oncology

## 2015-02-01 DIAGNOSIS — C50912 Malignant neoplasm of unspecified site of left female breast: Secondary | ICD-10-CM | POA: Diagnosis not present

## 2015-02-02 ENCOUNTER — Ambulatory Visit
Admission: RE | Admit: 2015-02-02 | Discharge: 2015-02-02 | Disposition: A | Discharge status: Home / Self Care | Payer: Medicare Other | Source: Ambulatory Visit | Attending: Radiation Oncology | Admitting: Radiation Oncology

## 2015-02-02 DIAGNOSIS — C50912 Malignant neoplasm of unspecified site of left female breast: Secondary | ICD-10-CM | POA: Diagnosis not present

## 2015-02-06 ENCOUNTER — Ambulatory Visit: Payer: Medicare Other | Admitting: Radiation Oncology

## 2015-02-06 ENCOUNTER — Ambulatory Visit
Admission: RE | Admit: 2015-02-06 | Discharge: 2015-02-06 | Disposition: A | Discharge status: Home / Self Care | Payer: Medicare Other | Source: Ambulatory Visit | Attending: Radiation Oncology | Admitting: Radiation Oncology

## 2015-02-06 ENCOUNTER — Encounter: Payer: Self-pay | Admitting: Radiation Oncology

## 2015-02-06 VITALS — BP 125/74 | HR 73 | Resp 16 | Wt 192.7 lb

## 2015-02-06 DIAGNOSIS — Z510 Encounter for antineoplastic radiation therapy: Secondary | ICD-10-CM | POA: Insufficient documentation

## 2015-02-06 DIAGNOSIS — C50912 Malignant neoplasm of unspecified site of left female breast: Secondary | ICD-10-CM

## 2015-02-06 MED ORDER — RADIAPLEXRX EX GEL
Freq: Once | CUTANEOUS | Status: AC
  Administered 2015-02-06: 09:00:00 via TOPICAL

## 2015-02-06 NOTE — Progress Notes (Signed)
Weight and vitals stable. Dermatitis noted left chest wall. No hyperpigmentation of treated area noted. Reports using radiaplex and hydrocortisone on affected area. Patient without complaints. Denies fatigue. Denies pain.  BP 125/74 mmHg  Pulse 73  Resp 16  Wt 192 lb 11.2 oz (87.408 kg) Wt Readings from Last 3 Encounters:  02/06/15 192 lb 11.2 oz (87.408 kg)  01/29/15 194 lb 8 oz (88.225 kg)  01/15/15 193 lb 6.4 oz (87.726 kg)

## 2015-02-06 NOTE — Progress Notes (Signed)
Weekly Management Note:  Site: Left breast Current Dose:  3800  cGy Projected Dose: 6000  cGy  Narrative: The patient is seen today for routine under treatment assessment. CBCT/MVCT images/port films were reviewed. The chart was reviewed.   She still doing well and uses Radioplex gel/hydrocortisone cream.  Physical Examination:  Filed Vitals:   02/06/15 0859  BP: 125/74  Pulse: 73  Resp: 16  .  Weight: 192 lb 11.2 oz (87.408 kg).  There is moderate erythema the skin with a papular dermatitis along the upper inner quadrant as noted previously.  No areas of desquamation.  Impression: Tolerating radiation therapy well.  Plan: Continue radiation therapy as planned.

## 2015-02-06 NOTE — Progress Notes (Signed)
Electron beam simulation note: The patient underwent virtual simulation for her left breast electron beam boost.  She was set up en face.  One custom block is constructed to conform the field.  I am prescribing 1000 cGy in 5 sessions utilizing 12 MEV electrons with Monte Carlo calculation.

## 2015-02-07 ENCOUNTER — Ambulatory Visit
Admission: RE | Admit: 2015-02-07 | Discharge: 2015-02-07 | Disposition: A | Discharge status: Home / Self Care | Payer: Medicare Other | Source: Ambulatory Visit | Attending: Radiation Oncology | Admitting: Radiation Oncology

## 2015-02-07 DIAGNOSIS — Z510 Encounter for antineoplastic radiation therapy: Secondary | ICD-10-CM | POA: Insufficient documentation

## 2015-02-07 DIAGNOSIS — C50912 Malignant neoplasm of unspecified site of left female breast: Secondary | ICD-10-CM | POA: Insufficient documentation

## 2015-02-08 ENCOUNTER — Ambulatory Visit
Admission: RE | Admit: 2015-02-08 | Discharge: 2015-02-08 | Disposition: A | Discharge status: Home / Self Care | Payer: Medicare Other | Source: Ambulatory Visit | Attending: Radiation Oncology | Admitting: Radiation Oncology

## 2015-02-08 DIAGNOSIS — Z510 Encounter for antineoplastic radiation therapy: Secondary | ICD-10-CM | POA: Diagnosis not present

## 2015-02-09 ENCOUNTER — Ambulatory Visit
Admission: RE | Admit: 2015-02-09 | Discharge: 2015-02-09 | Disposition: A | Discharge status: Home / Self Care | Payer: Medicare Other | Source: Ambulatory Visit | Attending: Radiation Oncology | Admitting: Radiation Oncology

## 2015-02-09 DIAGNOSIS — Z510 Encounter for antineoplastic radiation therapy: Secondary | ICD-10-CM | POA: Diagnosis not present

## 2015-02-12 ENCOUNTER — Encounter: Payer: Self-pay | Admitting: Radiation Oncology

## 2015-02-12 ENCOUNTER — Ambulatory Visit
Admission: RE | Admit: 2015-02-12 | Discharge: 2015-02-12 | Disposition: A | Discharge status: Home / Self Care | Payer: Medicare Other | Source: Ambulatory Visit | Attending: Radiation Oncology | Admitting: Radiation Oncology

## 2015-02-12 ENCOUNTER — Ambulatory Visit: Payer: Medicare Other | Admitting: Radiation Oncology

## 2015-02-12 DIAGNOSIS — Z510 Encounter for antineoplastic radiation therapy: Secondary | ICD-10-CM | POA: Diagnosis not present

## 2015-02-12 DIAGNOSIS — C50912 Malignant neoplasm of unspecified site of left female breast: Secondary | ICD-10-CM

## 2015-02-12 NOTE — Progress Notes (Signed)
PAIN: She is currently in no pain. SKIN: Pt left breast- positive for Hyperpigmentation and erythema.  Pt denies edema.  Pt continues to apply Radiaplex and Hydrocortisone (rarely) as directed. BP 128/76 mmHg  Pulse 66  Temp(Src) 98.2 F (36.8 C) (Oral)  Resp 12  Wt 193 lb 3.2 oz (87.635 kg)  SpO2 99% Wt Readings from Last 3 Encounters:  02/12/15 193 lb 3.2 oz (87.635 kg)  02/06/15 192 lb 11.2 oz (87.408 kg)  01/29/15 194 lb 8 oz (88.225 kg)

## 2015-02-12 NOTE — Progress Notes (Signed)
   Weekly Management Note:  outpatient    ICD-9-CM ICD-10-CM   1. Breast cancer, left breast 174.9 C50.912     Current Dose:  46 Gy  Projected Dose: 60 Gy   Narrative:  The patient presents for routine under treatment assessment.  CBCT/MVCT images/Port film x-rays were reviewed.  The chart was checked. Doing well  Physical Findings:  weight is 193 lb 3.2 oz (87.635 kg). Her oral temperature is 98.2 F (36.8 C). Her blood pressure is 128/76 and her pulse is 66. Her respiration is 12 and oxygen saturation is 99%.   Wt Readings from Last 3 Encounters:  02/12/15 193 lb 3.2 oz (87.635 kg)  02/06/15 192 lb 11.2 oz (87.408 kg)  01/29/15 194 lb 8 oz (88.225 kg)   Hyperpigmentation and dryness/dermatitis over left breast, particularly in UIQ  Impression:  The patient is tolerating radiotherapy.  Plan:  Continue radiotherapy as planned.    ________________________________   Eppie Gibson, M.D.

## 2015-02-13 ENCOUNTER — Ambulatory Visit
Admission: RE | Admit: 2015-02-13 | Discharge: 2015-02-13 | Disposition: A | Discharge status: Home / Self Care | Payer: Medicare Other | Source: Ambulatory Visit | Attending: Radiation Oncology | Admitting: Radiation Oncology

## 2015-02-13 DIAGNOSIS — Z510 Encounter for antineoplastic radiation therapy: Secondary | ICD-10-CM | POA: Diagnosis not present

## 2015-02-14 ENCOUNTER — Ambulatory Visit
Admission: RE | Admit: 2015-02-14 | Discharge: 2015-02-14 | Disposition: A | Discharge status: Home / Self Care | Payer: Medicare Other | Source: Ambulatory Visit | Attending: Radiation Oncology | Admitting: Radiation Oncology

## 2015-02-14 DIAGNOSIS — Z510 Encounter for antineoplastic radiation therapy: Secondary | ICD-10-CM | POA: Diagnosis not present

## 2015-02-15 ENCOUNTER — Ambulatory Visit
Admission: RE | Admit: 2015-02-15 | Discharge: 2015-02-15 | Disposition: A | Discharge status: Home / Self Care | Payer: Medicare Other | Source: Ambulatory Visit | Attending: Radiation Oncology | Admitting: Radiation Oncology

## 2015-02-15 DIAGNOSIS — Z510 Encounter for antineoplastic radiation therapy: Secondary | ICD-10-CM | POA: Diagnosis not present

## 2015-02-16 ENCOUNTER — Ambulatory Visit
Admission: RE | Admit: 2015-02-16 | Discharge: 2015-02-16 | Disposition: A | Discharge status: Home / Self Care | Payer: Medicare Other | Source: Ambulatory Visit | Attending: Radiation Oncology | Admitting: Radiation Oncology

## 2015-02-16 DIAGNOSIS — Z510 Encounter for antineoplastic radiation therapy: Secondary | ICD-10-CM | POA: Diagnosis not present

## 2015-02-19 ENCOUNTER — Ambulatory Visit
Admission: RE | Admit: 2015-02-19 | Discharge: 2015-02-19 | Disposition: A | Discharge status: Home / Self Care | Payer: Medicare Other | Source: Ambulatory Visit | Attending: Radiation Oncology | Admitting: Radiation Oncology

## 2015-02-19 DIAGNOSIS — Z510 Encounter for antineoplastic radiation therapy: Secondary | ICD-10-CM | POA: Diagnosis not present

## 2015-02-19 DIAGNOSIS — C50912 Malignant neoplasm of unspecified site of left female breast: Secondary | ICD-10-CM

## 2015-02-19 NOTE — Progress Notes (Signed)
Weekly Management Note:  Site: Left breast Current Dose:  5600  cGy Projected Dose: 6000  cGy  Narrative: The patient is seen today for routine under treatment assessment. CBCT/MVCT images/port films were reviewed. The chart was reviewed.   She is without new complaints today.  She uses Radioplex gel and also hydrocortisone for areas of pruritus.  Physical Examination: There were no vitals filed for this visit..  Weight:  .  There is moderate erythema along the left breast with a papular radiation dermatitis, particularly along the upper inner quadrant.  There is also patchy dry desquamation.  Impression: Tolerating radiation therapy well.  She will finish her radiation therapy this Wednesday.  Plan: Continue radiation therapy as planned.  One-month follow-up after completion of radiation therapy.

## 2015-02-19 NOTE — Progress Notes (Signed)
Pt here for PUT with Dr. Valere Dross.  No changes in medications or allergies.   Pt states they have continued breast sensitivity and itching with she manages with hydrocortisone.  She has two remaining radiation treatments.  Pt given 1 month follow up card.

## 2015-02-20 ENCOUNTER — Ambulatory Visit
Admission: RE | Admit: 2015-02-20 | Discharge: 2015-02-20 | Disposition: A | Discharge status: Home / Self Care | Payer: Medicare Other | Source: Ambulatory Visit | Attending: Radiation Oncology | Admitting: Radiation Oncology

## 2015-02-20 DIAGNOSIS — Z510 Encounter for antineoplastic radiation therapy: Secondary | ICD-10-CM | POA: Diagnosis not present

## 2015-02-21 ENCOUNTER — Ambulatory Visit
Admission: RE | Admit: 2015-02-21 | Discharge: 2015-02-21 | Disposition: A | Discharge status: Home / Self Care | Payer: Medicare Other | Source: Ambulatory Visit | Attending: Radiation Oncology | Admitting: Radiation Oncology

## 2015-02-21 ENCOUNTER — Encounter: Payer: Self-pay | Admitting: Radiation Oncology

## 2015-02-21 DIAGNOSIS — Z510 Encounter for antineoplastic radiation therapy: Secondary | ICD-10-CM | POA: Diagnosis not present

## 2015-02-21 NOTE — Progress Notes (Signed)
[facility] End of Treatment Note  Name:Melissa Holloway  Date: 02/21/2015 RDE:[id number] DOB:1945-09-24   Status:outpatient    CC: Lilian Coma, MD  Dr. Fanny Skates  REFERRING PHYSICIAN:  Dr. 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 tolerated treatment well with the expected degree of dermatitis by completion of therapy.  The dermatitis was papular along the upper inner quadrant of her left breast.  She had patchy dry desquamation.  She was treated with Radioplex gel and also hydrocortisone cream to areas of pruritus.                        PLAN: Routine followup in one month. Patient instructed to call if questions or worsening complaints in interim.

## 2015-03-22 ENCOUNTER — Telehealth: Payer: Self-pay | Admitting: Oncology

## 2015-03-22 ENCOUNTER — Ambulatory Visit (HOSPITAL_BASED_OUTPATIENT_CLINIC_OR_DEPARTMENT_OTHER): Payer: Medicare Other | Admitting: Oncology

## 2015-03-22 VITALS — BP 136/82 | HR 70 | Temp 98.3°F | Resp 18 | Ht 63.0 in | Wt 189.0 lb

## 2015-03-22 DIAGNOSIS — C50912 Malignant neoplasm of unspecified site of left female breast: Secondary | ICD-10-CM | POA: Diagnosis not present

## 2015-03-22 NOTE — Telephone Encounter (Signed)
Gave avs & calendar for November.  °

## 2015-03-22 NOTE — Progress Notes (Signed)
[facility]  [phone] [phone]     ID: Melissa Holloway DOB: August 21, 1945  MR#: [id number]  QIO#:[id number]  Patient Care Team: Melissa Jordan, MD as PCP - General (Family Medicine) PCP: Melissa Coma, MD GYN: SU: Melissa Skates MD OTHER MD: Melissa Pear MD, Melissa Monarch MD  CHIEF COMPLAINT: early-stage estrogen receptor positive breast cancer  CURRENT TREATMENT: anastrozole   BREAST CANCER HISTORY: From the initial intake note:  Melissa Holloway had routine screening mammography with tomography [facility]. This showed a possible mass in the left breast. On 08/16/2014 left diagnostic mammography and ultrasonography found the breast density to be category B. In the upper inner quadrant of the left breast there was a 7 mm spiculated mass which was not palpable by exam. Ultrasound confirmed a 5 mm irregular to her than wide hypoechoic mass in the area in question. There was no left axillary adenopathy.  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  On 09/08/2014 the patient underwent bilateral breast MRI. This confirmed the biopsy-proven malignancy in the upper inner left breast and measured it at 0.8 cm. There was also an irregular enhancing mass in the lower outer left breast measuring 1 cm maximally. Also in the right breast there was an irregular enhancing mass in the lower outer quadrant measuring 0.6 cm. There was no abnormal adenopathy in either axilla and no internal mammary adenopathy. There were incidental cysts noted in the liver, previously imaged by ultrasound in January 2014.  Ultrasound of both breasts on 09/14/2014 failed to identify the  additionalsuspicious areas seen on MRI.accordingly the patient proceeded to bilateral MRI guided biopsies on 09/27/2014. In the right breast lower outer quadrant there was a fibroadenoma with no atypia or malignancy. In the left breast central biopsy there was atypical ductal and atypical lobular hyperplasia.  Her subsequent history is as detailed below  INTERVAL HISTORY: Melissa Holloway returns today for follow-up of her breast cancer. Since her last visit here she completed her radiation treatments. She generally did well with these, with no significant desquamation problems and only mild fatigue. She is now ready to discuss antiestrogen therapy  REVIEW OF SYSTEMS:  A detailed review of systems today was entirely stable  PAST MEDICAL HISTORY: Past Medical History  Diagnosis Date  . Esophageal hernia     s/p  . Breast cancer, left breast 08/23/2014  . Family history of breast cancer   . PONV (postoperative nausea and vomiting)   . Arthritis     PAST SURGICAL HISTORY: Past Surgical History  Procedure Laterality Date  . Esophageal hernia  10  . Tonsillectomy    . Total knee arthroplasty Left 01/09/2014    Procedure: LEFT TOTAL KNEE ARTHROPLASTY;  Surgeon: Kerin Salen, MD;  Location: [facility];  Service: Orthopedics;  Laterality: Left;  . Colonoscopy    . Radioactive seed guided mastectomy with axillary sentinel lymph node biopsy Left 10/18/2014    Procedure: LEFT PARTIAL MASTECTOMY AND 1 LEFT RADIOACTIVE SEED 1 NEEDLE LOCALIZATION LEFT AXILLARY SENTINAL NODE BIOPSY;  Surgeon: Melissa Skates, MD;  Location: [facility];  Service: General;  Laterality: Left;  . Re-excision of breast lumpectomy Left 11/27/2014    Procedure: LEFT PARTIAL LUMPECTOMY RE-EXCISION LATERAL  AND POSTERIOR MARGINS;  Surgeon: Melissa Skates, MD;  Location: [facility];  Service: General;  Laterality: Left;    FAMILY HISTORY Family History  Problem Relation Age of Onset  . Breast cancer Mother 41     TAH/BSO; deceased 34  . Prostate cancer Brother 76    deceased 46  . Breast cancer Maternal Aunt 70    deceased 50  . Breast cancer Paternal Aunt 55    Currently 101  . Cancer Paternal Grandmother     unknown type; deceased 21s  the patient's father died at the age of 14 following a fall. The patient's mother was diagnosed with breast cancer around age 39. She died with Alzheimer's disease around age 55. The patient had 4 brothers, 3 sisters. One brother was diagnosed with prostate cancer at the age of 29. In addition a paternal aunt was diagnosed with breast cancer at age 33. She is not 68 years old. She has 6 daughters mostly in their 94s none of whom have had breast cancer. There is no history of ovarian cancer in the family.  GYNECOLOGIC HISTORY:  No LMP recorded. Patient is postmenopausal. Menarche age 47, first live birth age 49. The patient is GX P1. The patient went through menopause around 2008. She did not take hormone replacement. She did take oral contraceptives remotely for more than a year with no complications  SOCIAL HISTORY:  Melissa Holloway is retired from regular. Her husband Melissa Holloway") is a retired Administrator. Their son Melissa Holloway lives in [place] and works in Press photographer. The patient has one 25-monthold granddaughter and a grandson due 01/11/2015.    ADVANCED DIRECTIVES: not in place   HEALTH MAINTENANCE: Social History  Substance Use Topics  . Smoking status: Never Smoker   . Smokeless tobacco: Not on file  . Alcohol Use: No     Colonoscopy:  PAP:  Bone density:  Lipid panel:  No Known Allergies  Current Outpatient Prescriptions  Medication Sig Dispense Refill  . aspirin EC 81 MG tablet Take 81 mg by mouth daily.    .Marland Kitchenatorvastatin (LIPITOR) 20 MG tablet Take 20 mg by mouth daily.    . hyaluronate sodium (RADIAPLEXRX) GEL Apply 1 application topically 2 (two) times daily.    . Loratadine (CLARITIN PO) Take 10 mg by mouth as needed.    . non-metallic deodorant  (Jethro Poling MISC Apply 1 application topically daily as needed.    .Marland KitchenPropylene Glycol (SYSTANE BALANCE OP) Place 1 drop into both eyes every morning.    . vitamin C (ASCORBIC ACID) 500 MG tablet Take 500 mg by mouth daily.     No current facility-administered medications for this visit.    OBJECTIVE: middle-aged white woman who appears well Filed Vitals:   03/22/15 1236  BP: 136/82  Pulse: 70  Temp: 98.3 F (36.8 C)  Resp: 18     Body mass index is 33.49 kg/(m^2).    ECOG FS:0 - Asymptomatic  Sclerae unicteric, EOMs intact Oropharynx clear, dentition in good repair No cervical or supraclavicular adenopathy Lungs no rales or rhonchi Heart regular rate and rhythm Abd soft, nontender, positive bowel sounds MSK no focal spinal tenderness, no upper extremity lymphedema Neuro: nonfocal, well oriented, appropriate affect Breasts: The right breast is status post lumpectomy and radiation. There is some erythema remaining but this is minimal. There is no evidence of disease recurrence. The right axilla is benign. The left breast is unremarkable.  LAB RESULTS:  CMP  Component Value Date/Time   NA 143 10/10/2014 1554   NA 141 01/03/2014 1030   K 4.0 10/10/2014 1554   K 3.9 01/03/2014 1030   CL 103 01/03/2014 1030   CO2 28 10/10/2014 1554   CO2 26 01/03/2014 1030   GLUCOSE 108 10/10/2014 1554   GLUCOSE 95 01/03/2014 1030   BUN 16.2 10/10/2014 1554   BUN 13 01/03/2014 1030   CREATININE 0.7 10/10/2014 1554   CREATININE 0.62 01/03/2014 1030   CALCIUM 10.0 10/10/2014 1554   CALCIUM 9.6 01/03/2014 1030   PROT 7.2 10/10/2014 1554   PROT 7.4 04/29/2010 0958   ALBUMIN 4.0 10/10/2014 1554   ALBUMIN 4.4 04/29/2010 0958   AST 25 10/10/2014 1554   AST 22 04/29/2010 0958   ALT 25 10/10/2014 1554   ALT 18 04/29/2010 0958   ALKPHOS 103 10/10/2014 1554   ALKPHOS 77 04/29/2010 0958   BILITOT 0.53 10/10/2014 1554   BILITOT 0.5 04/29/2010 0958   GFRNONAA >90 01/03/2014 1030   GFRAA >90  01/03/2014 1030    INo results found for: SPEP, UPEP  Lab Results  Component Value Date   WBC 6.2 10/10/2014   NEUTROABS 3.7 10/10/2014   HGB 14.4 10/10/2014   HCT 43.9 10/10/2014   MCV 87.5 10/10/2014   PLT 262 10/10/2014      Chemistry      Component Value Date/Time   NA 143 10/10/2014 1554   NA 141 01/03/2014 1030   K 4.0 10/10/2014 1554   K 3.9 01/03/2014 1030   CL 103 01/03/2014 1030   CO2 28 10/10/2014 1554   CO2 26 01/03/2014 1030   BUN 16.2 10/10/2014 1554   BUN 13 01/03/2014 1030   CREATININE 0.7 10/10/2014 1554   CREATININE 0.62 01/03/2014 1030      Component Value Date/Time   CALCIUM 10.0 10/10/2014 1554   CALCIUM 9.6 01/03/2014 1030   ALKPHOS 103 10/10/2014 1554   ALKPHOS 77 04/29/2010 0958   AST 25 10/10/2014 1554   AST 22 04/29/2010 0958   ALT 25 10/10/2014 1554   ALT 18 04/29/2010 0958   BILITOT 0.53 10/10/2014 1554   BILITOT 0.5 04/29/2010 0958       No results found for: LABCA2  No components found for: LABCA125  No results for input(s): INR in the last 168 hours.  Urinalysis    Component Value Date/Time   COLORURINE YELLOW 01/03/2014 [place] 01/03/2014 1042   LABSPEC 1.012 01/03/2014 1042   PHURINE 6.0 01/03/2014 1042   GLUCOSEU NEGATIVE 01/03/2014 1042   HGBUR NEGATIVE 01/03/2014 1042   BILIRUBINUR NEGATIVE 01/03/2014 1042   KETONESUR NEGATIVE 01/03/2014 1042   PROTEINUR NEGATIVE 01/03/2014 1042   UROBILINOGEN 0.2 01/03/2014 1042   NITRITE NEGATIVE 01/03/2014 1042   LEUKOCYTESUR TRACE* 01/03/2014 1042    STUDIES: No results found.  ASSESSMENT: 69 y.o. [place] woman status post left breast upper inner quadrant biopsy 08/31/2014 for a clinical T1b N0, stage IA invasive ductal carcinoma, grade 1, strongly estrogen and progesterone receptor positive, HER-2 not amplified, with an MIB-1 of 10%  (1) MRI biopsy of 2 additional lesions showed  (a) in the left central breast, atypical lobular hyperplasia and  atypical ductal hyperplasia  (b) in the right breast lower outer quadrant a fibroadenoma   (2) dual lumpectomy 10/18/2014 showed  (a) labeled "left upper inner quadrant", apT1b pN0, stage IA invasive ductal carcinoma, grade 1, and again HER-2 negative  (b) labeled "left central", ductal carcinoma in situ, low-grade,  with a positive lateral and other close margins   (3) additional surgery for margin clearance for 20 12/22/2014 was successful, although the lateral margin remained close at 1 mm  (4) Oncotype DX score of 9 predicts an outside the breast risk of recurrence within 10 years of 6% if the patient's only systemic treatment is tamoxifen for 5 years. It also predicts no benefit from chemotherapy.   (5)  adjuvant radiation 01/10/2015 through 02/21/2015:   Left breast 5000 cGy 25 sessions, left breast boost 1000 cGy 5 sessions    (6) anastrozole started 03/23/2015  PLAN: Melissa Holloway did remarkably well with her radiation. Today we discussed her Oncotype results and also the fact that we have many options for antiestrogen therapies, but that the best options include a minimum of 2 years of aromatase inhibitors. We discussed the possible toxicities, side effects and complications of tamoxifen as compared to anastrozole.  After much discussion we thought anastrozole would be the better drug to begin with. If she tolerates it well we will obtain a bone density and make sure she has adequate prophylaxis for osteoporosis. At the 2 year mark we can decide whether she would like to switch to tamoxifen or continue on anastrozole for an additional 3-8 years.  Once she is stably on anastrozole, we will refer to the PALLAS study.  Melissa Holloway has a good understanding of the overall plan. She agrees with it. She knows the goal of treatment in her case is cure. She will call with any problems that may develop before her next visit  here.  Chauncey Cruel, MD   03/22/2015 1:02 PM Medical Oncology and Hematology [facility] [address] [place], [place] [address] Tel. [phone]    Fax. [phone]

## 2015-03-24 MED ORDER — ANASTROZOLE 1 MG PO TABS: 1.0000 mg | ORAL_TABLET | Freq: Every day | ORAL | Status: DC

## 2015-03-27 ENCOUNTER — Telehealth: Payer: Self-pay | Admitting: Adult Health

## 2015-03-27 ENCOUNTER — Ambulatory Visit: Payer: Self-pay | Admitting: Radiation Oncology

## 2015-03-27 ENCOUNTER — Encounter: Payer: Self-pay | Admitting: Radiation Oncology

## 2015-03-27 NOTE — Telephone Encounter (Signed)
I left a voicemail for Melissa Holloway asking her to return my call regarding her eligibility to come see [place] in the survivorship clinic now that she has completed treatment for breast cancer.  I asked that she return my call so that we can discuss survivorship further and make her an appt.  We look forward to participating in her care.   Mike Craze, NP [place] [phone]

## 2015-03-28 ENCOUNTER — Ambulatory Visit
Admission: RE | Admit: 2015-03-28 | Discharge: 2015-03-28 | Disposition: A | Discharge status: Home / Self Care | Payer: Medicare Other | Source: Ambulatory Visit | Attending: Radiation Oncology | Admitting: Radiation Oncology

## 2015-03-28 ENCOUNTER — Encounter: Payer: Self-pay | Admitting: Radiation Oncology

## 2015-03-28 VITALS — BP 124/72 | HR 72 | Temp 97.5°F | Ht 63.0 in | Wt 190.7 lb

## 2015-03-28 DIAGNOSIS — C50912 Malignant neoplasm of unspecified site of left female breast: Secondary | ICD-10-CM

## 2015-03-28 HISTORY — DX: Personal history of irradiation: Z92.3

## 2015-03-28 NOTE — Progress Notes (Signed)
Melissa Holloway here for reassessment S/P XRT  To her left breast.  No voiced concerns.  Mild tan noted in tx field.

## 2015-03-28 NOTE — Progress Notes (Signed)
CC: Dr. Fanny Skates   Follow-up note:  Ms. Woodham returns today approximately 1 month following completion of radiation therapy following conservative surgery in the management of her T1b N0 invasive ductal/DCIS of the left breast (multicentric).  She was started on anastrozole earlier this week by Dr. Jana Hakim.  She is pleased with her progress and her cosmesis.  She tells me she will see Dr. Dalbert Batman later this year.  She will see Dr. Jana Hakim for a follow-up visit in late November.  Physical examination: Alert and oriented. Filed Vitals:   03/28/15 0936  BP: 124/72  Pulse: 72  Temp: 97.5 F (36.4 C)   Nodes: There is no palpable cervical, supraclavicular, or axillary lymphadenopathy.  Breasts: There is residual hyperpigmentation the skin along the left breast with no areas of desquamation.  There is minimal thickening of the left breast.  No masses are appreciated.  Cosmesis is excellent.  Right breast without masses or lesions.  Extremities: Without edema.  Impression: Satisfactory progress.  She should have bilateral mammography no later than this January 2017.  Plan: Follow-up through Dr. Dalbert Batman and Dr. Jana Hakim.  I've not scheduled the patient for a formal follow-up visit.

## 2015-04-04 ENCOUNTER — Other Ambulatory Visit: Payer: Self-pay | Admitting: Adult Health

## 2015-04-04 DIAGNOSIS — C50912 Malignant neoplasm of unspecified site of left female breast: Secondary | ICD-10-CM

## 2015-04-06 ENCOUNTER — Telehealth: Payer: Self-pay | Admitting: Oncology

## 2015-04-06 NOTE — Telephone Encounter (Signed)
Called and left a message to call and schedule survivorship if interested

## 2015-05-11 ENCOUNTER — Telehealth: Payer: Self-pay | Admitting: Adult Health

## 2015-05-11 NOTE — Telephone Encounter (Signed)
I received a voicemail from Ms. Ciocca requesting a return call to schedule her survivorship visit.  I attempted to reach her via returned call, but there was no answer at the number provided.   I have asked Chestine Spore, NP to give the patient a call on Monday, 05/14/15 and attempt to schedule the patient's survivorship appt at that time.    Mike Craze, NP [place] [phone]

## 2015-05-25 ENCOUNTER — Encounter: Payer: Self-pay | Admitting: Nurse Practitioner

## 2015-05-25 ENCOUNTER — Encounter: Payer: Self-pay | Admitting: *Deleted

## 2015-05-25 ENCOUNTER — Ambulatory Visit (HOSPITAL_BASED_OUTPATIENT_CLINIC_OR_DEPARTMENT_OTHER): Payer: Medicare Other | Admitting: Nurse Practitioner

## 2015-05-25 VITALS — BP 147/74 | HR 74 | Temp 98.1°F | Resp 18 | Ht 63.0 in | Wt 193.0 lb

## 2015-05-25 DIAGNOSIS — C50912 Malignant neoplasm of unspecified site of left female breast: Secondary | ICD-10-CM | POA: Diagnosis not present

## 2015-05-25 DIAGNOSIS — Z79811 Long term (current) use of aromatase inhibitors: Secondary | ICD-10-CM | POA: Diagnosis not present

## 2015-05-25 NOTE — Progress Notes (Signed)
CLINIC:  Cancer Survivorship   REASON FOR VISIT:  Routine follow-up post-treatment for a recent history of breast cancer.  BRIEF ONCOLOGIC HISTORY:    Breast cancer, left breast ([place])   08/10/2014 Mammogram Left breast: possible mass warranting further evaluation   08/16/2014 Breast US Left breast: 5 x 4 x 5 mm irregular taller than wide hypoechoic mass with posterior acoustic shadowing in the left breast 11 o'clock position 3 cm from the nipple. No LAD axillary lymphadenopathy.   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 Indeterminate irregular enhancing mass in the LOQ left breast measuring 1 cm. Indeterminate irregular mass in right breast measuring 0.6 cm   09/27/2014 Clinical Stage Stage IA: T1b N0   09/27/2014 Procedure Left breast core needle bx of central mass: atypical ductal and lobular hyperplasia; LOQ mass of right breast: fibroadenoma with associated fibrocystic changes   09/28/2014 Procedure Genetic testing: Breast Next Cephus Shelling) panel revealed no clinically significant variant at ATM, BARD1, BRCA1, BRCA2, BRIP1, CDH1, CHEK2, MRE11A, MUTYH, NBN, NF1, PALB2, PTEN, RAD50, RAD51C, RAD51D, and TP53.   10/18/2014 Definitive Surgery Left dual lumpectomy Dalbert Batman): LUIQ: invasive ductal carcinoma, 0.6 cm, grade 1, HER2/neu repeated and remains negative (0.88),  associated low grade DCIS, postive margins. 2 LN removed and negative for malignancy (0/2). L central: DCIS   10/18/2014 Pathologic Stage Stage IA: pT1b pN0   10/18/2014 Oncotype testing Score: 9 (6%ROR) No chemotherapy   11/27/2014 Surgery Re-excision of margins: clear.  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   01/10/2015 - 02/21/2015 Radiation Therapy Adjuvant RT Valere Dross): Left breast 50 Gy over 25 sessions.  Left breast  boost 10 Gy over 5 sessions   03/23/2015 -  Anti-estrogen oral therapy Anastrozole 1 mg daily. Planned duration of therapy 5-10 years    INTERVAL HISTORY:  Melissa Holloway presents to the [facility] today for our initial meeting to review her survivorship care plan detailing her treatment course for breast cancer, as well as monitoring long-term side effects of that treatment, education regarding health maintenance, screening, and overall wellness and health promotion.     Overall, Melissa Holloway reports feeling quite well since completing her radiation therapy approximately three months ago.  She denies any fatigue and reports that the skin changes over her left breast have improved following radiation. She reports a good appetite and denies any weight loss or headache. She recently had a cold and continues with a cough that is largely dry, intermittently productive of small amounts of clear to white sputum.  Denies any blood or chest pain, or change within her breast. She has some tingling in her bilateral hands when she uses her ipad, but otherwise denies pain or neuropathy.  She began her anastrozole in August 2016 and is tolerating it well, specifically denying any hot flashes, vaginal dryness or joint aches.  REVIEW OF SYSTEMS:  General: Denies fever, chills, or night sweats.  HEENT: Denies visual changes, hearing loss, mouth sores, or difficulty swallowing. Cardiac: Denies palpitations, chest pain, and lower extremity edema.  Respiratory: Cough without shortness of breath as above. Denies dyspnea on exertion.  GI: Denies abdominal pain, constipation, diarrhea, nausea, or vomiting.  GU: Denies dysuria, hematuria, vaginal bleeding, vaginal discharge, or vaginal dryness.  Musculoskeletal: Denies joint or bone pain.  Neuro: Numbness as above.  Denies recent falls.  Skin: Denies rash, pruritis, or open wounds.  Breast: Denies any new nodularity, masses, tenderness, nipple changes, or nipple  discharge.  Psych: Denies depression, anxiety, insomnia, or memory loss.   A 14-point review of systems was completed and was negative, except as noted above.   ONCOLOGY TREATMENT TEAM:  1. Surgeon:  Dr. Dalbert Batman at [facility]  2. Medical Oncologist: Dr. Jana Hakim 3. Radiation Oncologist: Dr. Valere Dross    PAST MEDICAL/SURGICAL HISTORY:  Past Medical History  Diagnosis Date  . Esophageal hernia     s/p  . Breast cancer, left breast ([place]) 08/23/2014  . Family history of breast cancer   . PONV (postoperative nausea and vomiting)   . Arthritis   . S/P radiation therapy 01/10/2015 through 02/21/2015     Left breast 5000 cGy 25 sessions, left breast boost 1000 cGy 5 sessions     Past Surgical History  Procedure Laterality Date  . Esophageal hernia  10  . Tonsillectomy    . Total knee arthroplasty Left 01/09/2014    Procedure: LEFT TOTAL KNEE ARTHROPLASTY;  Surgeon: Kerin Salen, MD;  Location: [facility];  Service: Orthopedics;  Laterality: Left;  . Colonoscopy    . Radioactive seed guided mastectomy with axillary sentinel lymph node biopsy Left 10/18/2014    Procedure: LEFT PARTIAL MASTECTOMY AND 1 LEFT RADIOACTIVE SEED 1 NEEDLE LOCALIZATION LEFT AXILLARY SENTINAL NODE BIOPSY;  Surgeon: Fanny Skates, MD;  Location: [facility];  Service: General;  Laterality: Left;  . Re-excision of breast lumpectomy Left 11/27/2014    Procedure: LEFT PARTIAL LUMPECTOMY RE-EXCISION LATERAL AND POSTERIOR MARGINS;  Surgeon: Fanny Skates, MD;  Location: [facility];  Service: General;  Laterality: Left;     ALLERGIES:  No Known Allergies   CURRENT MEDICATIONS:  Current Outpatient Prescriptions on File Prior to Visit  Medication Sig Dispense Refill  . anastrozole (ARIMIDEX) 1 MG tablet Take 1 tablet (1 mg total) by mouth daily. (Patient taking differently: Take 1 mg by mouth daily. Started on  03/24/15) 90 tablet 4  . aspirin EC 81 MG tablet Take 81 mg by mouth daily.    Marland Kitchen atorvastatin (LIPITOR) 20 MG tablet Take 20 mg by mouth daily.    . hyaluronate sodium (RADIAPLEXRX) GEL Apply 1 application topically 2 (two) times daily.    . Loratadine (CLARITIN PO) Take 10 mg by mouth as needed.    . non-metallic deodorant Jethro Poling) MISC Apply 1 application topically daily as needed.    Marland Kitchen Propylene Glycol (SYSTANE BALANCE OP) Place 1 drop into both eyes every morning.    . vitamin C (ASCORBIC ACID) 500 MG tablet Take 500 mg by mouth daily.     No current facility-administered medications on file prior to visit.     ONCOLOGIC FAMILY HISTORY:  Family History  Problem Relation Age of Onset  . Breast cancer Mother 59    TAH/BSO; deceased 37  . Prostate cancer Brother 70    deceased 52  . Breast cancer Maternal Aunt 70    deceased 45  . Breast cancer Paternal Aunt 65    Currently 101  . Cancer Paternal Grandmother     unknown type; deceased 90s     GENETIC COUNSELING/TESTING: Performed 09/28/2014: Breast Next Cephus Shelling) panel revealed no clinically significant variant at ATM, BARD1, BRCA1, BRCA2, BRIP1, CDH1, CHEK2, MRE11A, MUTYH, NBN, NF1, PALB2, PTEN, RAD50, RAD51C, RAD51D, and TP53.  SOCIAL HISTORY:  Melissa Holloway is married and lives with  her spouse in [place], [place].  She has one son children who lives in [place], along with a granddaughter and grandson.  Melissa Holloway is currently not working.  She denies any current or history of tobacco, alcohol, or illicit drug use.     PHYSICAL EXAMINATION:  Vital Signs:   Filed Vitals:   05/25/15 0857  BP: 147/74  Pulse: 74  Temp: 98.1 F (36.7 C)  Resp: 18   ECOG Performance status: 0 General: Well-nourished, well-appearing female in no acute distress.  She is unaccompanied in clinic today.   HEENT: Head is atraumatic and normocephalic.  Conjunctivae clear without exudate.  Sclerae anicteric. Oral mucosa is pink, moist, and  intact without lesions.   Lymph: No cervical, supraclavicular, infraclavicular, or axillary lymphadenopathy noted on palpation.  Cardiovascular: Regular rate and rhythm without murmurs, rubs, or gallops. Respiratory: Clear to auscultation bilaterally. Chest expansion symmetric without accessory muscle use on inspiration or expiration.  GI: Abdomen soft and round. No tenderness to palpation. Bowel sounds normoactive in 4 quadrants.  GU: Deferred.  Musculoskeletal: Muscle strength 5/5 in all extremities.   Neuro: No focal deficits. Steady gait.  Psych: Mood and affect normal and appropriate for situation.  Extremities: No edema, cyanosis, or clubbing.  Skin: Warm and dry. No open lesions noted.   LABORATORY DATA:  None for this visit.  DIAGNOSTIC IMAGING:  None for this visit.     ASSESSMENT AND PLAN:   1. 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 Melissa Holloway is doing well without clinical symptoms suggestive of disease recurrence.  She will follow-up with her medical oncologist, Dr. Jana Hakim, in November 2016 with history and physical exam per surveillance protocol.  She will continue her anti-estrogen therapy with anastrozole as prescribed by  Dr. Jana Hakim She was instructed to make Dr. Jana Hakim or myself aware if she begins to experience any side effects of the medication and I could see her back in clinic to help manage those side effects, as needed. A comprehensive survivorship care plan and treatment summary was reviewed with the patient today detailing her breast cancer diagnosis, treatment course, potential late/long-term effects of treatment, appropriate follow-up care with recommendations for the future, and patient education resources.  A copy of this summary, along with  a letter will be sent to the patient's primary care provider via in basket message after today's visit.  Melissa Holloway is welcome to return to the Survivorship Clinic in the future, as needed; no follow-up will be scheduled at this time.    2. Bone health:  Given Melissa Holloway's age/history of breast cancer and her current treatment regimen including anti-estrogen therapy with anastrozole, she is at risk for bone demineralization. She shares that her last DEXA scan was a 1-2 years ago and was normal.  We will continue to monitor this.  In the meantime, she was encouraged to increase her consumption of foods rich in calcium and vitamin D as well as to increase her weight-bearing activities. She has been encouraged to begin vitamin D supplementation, which I supported today, as her levels have been low in the past.  She will begin 1000 IU daily following today's appointment and was given education on specific activities to promote bone health.  3. Cancer screening:  Due to Melissa Holloway's history and her age,  she should receive screening for skin cancers, colon cancer, and gynecologic cancers.  The information and recommendations are listed on the patient's comprehensive care plan/treatment summary and were reviewed in detail with the patient.    4. Health maintenance and wellness promotion: Melissa Holloway was encouraged to consume 5-7 servings of fruits and vegetables per day. We reviewed the "Nutrition Rainbow" handout, as well as the handout about "Nutrition for Breast Cancer Survivors."  She was also encouraged to continue to engage in moderate to vigorous exercise for 30 minutes per day most days of the week. We discussed the [employer] fitness program, which is designed for cancer survivors to help them become more physically fit after cancer treatments.  She was instructed to limit her alcohol consumption and continue to abstain from tobacco use.     5. Support services/counseling: It is not uncommon for this period of  the patient's cancer care trajectory to be one of many emotions and stressors.  We discussed an opportunity for her to participate in the next session of [facility] ("Finding Your New Normal") support group series designed for patients after they have completed treatment.   Melissa Holloway was encouraged to take advantage of our many other support services programs, support groups, and/or counseling in coping with her new life as a cancer survivor after completing anti-cancer treatment.  She was offered support today through active listening and expressive supportive counseling.  She was given information regarding our available services and encouraged to contact me with any questions or for help enrolling in any of our support group/programs.    A total of 50 minutes of face-to-face time was spent with this patient with greater than 50% of that time in counseling and care-coordination.   Sylvan Cheese, NP  Survivorship Program [facility] [phone]   Note: PRIMARY CARE PROVIDER Lilian Coma, [place] [phone]

## 2015-05-25 NOTE — Progress Notes (Signed)
[place] Advance Directives Clinical Social Work  Clinical Social Work was referred by survivorship NP to review and complete healthcare advance directives.  Clinical Social Worker met with patient  in [place] office.  The patient designated spouse, Hollice Gong, as their primary healthcare agent and son Anola Gurney as their secondary agent.  Patient also completed healthcare living will.  Patient had documents completed prior to visit.  CSW briefly reviewed with patient.  Clinical Social Worker notarized documents and made copies for patient/family. Clinical Social Worker will send documents to medical records to be scanned into patient's chart. Clinical Social Worker encouraged patient/family to contact with any additional questions or concerns.  Polo Riley, MSW, [place] Worker [facility] [phone]

## 2015-06-25 ENCOUNTER — Other Ambulatory Visit (HOSPITAL_BASED_OUTPATIENT_CLINIC_OR_DEPARTMENT_OTHER): Payer: Medicare Other

## 2015-06-25 DIAGNOSIS — C50912 Malignant neoplasm of unspecified site of left female breast: Secondary | ICD-10-CM | POA: Diagnosis not present

## 2015-06-25 LAB — COMPREHENSIVE METABOLIC PANEL (CC13)
ALT: 26 U/L (ref 0–55)
ANION GAP: 9 meq/L (ref 3–11)
AST: 27 U/L (ref 5–34)
Albumin: 4.1 g/dL (ref 3.5–5.0)
Alkaline Phosphatase: 104 U/L (ref 40–150)
BILIRUBIN TOTAL: 0.93 mg/dL (ref 0.20–1.20)
BUN: 10.5 mg/dL (ref 7.0–26.0)
CALCIUM: 10.1 mg/dL (ref 8.4–10.4)
CHLORIDE: 106 meq/L (ref 98–109)
CO2: 28 meq/L (ref 22–29)
CREATININE: 0.7 mg/dL (ref 0.6–1.1)
EGFR: 87 mL/min/{1.73_m2} — ABNORMAL LOW (ref >90)
Glucose: 89 mg/dl (ref 70–140)
POTASSIUM: 4.9 meq/L (ref 3.5–5.1)
Sodium: 142 mEq/L (ref 136–145)
Total Protein: 7 g/dL (ref 6.4–8.3)

## 2015-06-25 LAB — CBC & DIFF AND RETIC
BASO%: 1.9 % (ref 0.0–2.0)
Basophils Absolute: 0.1 10*3/uL (ref 0.0–0.1)
EOS%: 2.9 % (ref 0.0–7.0)
Eosinophils Absolute: 0.1 10*3/uL (ref 0.0–0.5)
HCT: 42.3 % (ref 34.8–46.6)
HGB: 13.9 g/dL (ref 11.6–15.9)
Immature Retic Fract: 7.7 % (ref 1.60–10.00)
LYMPH%: 30.4 % (ref 14.0–49.7)
MCH: 28.7 pg (ref 25.1–34.0)
MCHC: 32.9 g/dL (ref 31.5–36.0)
MCV: 87.4 fL (ref 79.5–101.0)
MONO#: 0.3 10*3/uL (ref 0.1–0.9)
MONO%: 7.7 % (ref 0.0–14.0)
NEUT#: 2.2 10*3/uL (ref 1.5–6.5)
NEUT%: 57.1 % (ref 38.4–76.8)
PLATELETS: 245 10*3/uL (ref 145–400)
RBC: 4.84 10*6/uL (ref 3.70–5.45)
RDW: 14.2 % (ref 11.2–14.5)
Retic %: 1.12 % (ref 0.70–2.10)
Retic Ct Abs: 54.21 10*3/uL (ref 33.70–90.70)
WBC: 3.8 10*3/uL — ABNORMAL LOW (ref 3.9–10.3)
lymph#: 1.2 10*3/uL (ref 0.9–3.3)
nRBC: 0 % (ref 0–0)

## 2015-06-26 LAB — VITAMIN D 25 HYDROXY (VIT D DEFICIENCY, FRACTURES): Vit D, 25-Hydroxy: 20 ng/mL — ABNORMAL LOW (ref 30–100)

## 2015-07-02 ENCOUNTER — Ambulatory Visit: Payer: Medicare Other | Admitting: Oncology

## 2015-07-02 ENCOUNTER — Encounter: Payer: Self-pay | Admitting: Oncology

## 2015-07-03 ENCOUNTER — Telehealth: Payer: Self-pay | Admitting: Oncology

## 2015-07-03 NOTE — Telephone Encounter (Signed)
returned call and s.s.w pt and r/s missed appt.Marland KitchenMarland KitchenMarland KitchenMarland Kitchenpt ok and aware

## 2015-07-12 ENCOUNTER — Telehealth: Payer: Self-pay | Admitting: Oncology

## 2015-07-12 ENCOUNTER — Ambulatory Visit (HOSPITAL_BASED_OUTPATIENT_CLINIC_OR_DEPARTMENT_OTHER): Payer: Medicare Other | Admitting: Oncology

## 2015-07-12 VITALS — BP 131/75 | HR 62 | Temp 98.1°F | Resp 18 | Ht 63.0 in | Wt 191.0 lb

## 2015-07-12 DIAGNOSIS — E559 Vitamin D deficiency, unspecified: Secondary | ICD-10-CM

## 2015-07-12 DIAGNOSIS — C50212 Malignant neoplasm of upper-inner quadrant of left female breast: Secondary | ICD-10-CM

## 2015-07-12 DIAGNOSIS — M8580 Other specified disorders of bone density and structure, unspecified site: Secondary | ICD-10-CM

## 2015-07-12 NOTE — Telephone Encounter (Signed)
Appointments made and avs printed for patient,she will get her dexa at [facility] where she had her previous one

## 2015-07-12 NOTE — Progress Notes (Signed)
[place]  [phone] [phone]     ID: Melissa Holloway DOB: 08/11/1945  MR#: [id number]  SFK#:[id number]  Patient Care Team: Melissa Jordan, MD as PCP - General (Family Medicine) Melissa Cruel, MD as Consulting Physician (Oncology) Melissa Skates, MD as Consulting Physician (General Surgery) Melissa Koh, MD as Consulting Physician (Radiation Oncology) Melissa Cheese, NP as Nurse Practitioner (Hematology and Oncology) PCP: Melissa Coma, MD GYN: SU: Melissa Skates MD OTHER MD: Melissa Pear MD, Melissa Monarch MD  CHIEF COMPLAINT: early-stage estrogen receptor positive breast cancer  CURRENT TREATMENT: anastrozole   BREAST CANCER HISTORY: From the initial intake note:  Melissa Holloway had routine screening mammography with tomography [facility]. This showed a possible mass in the left breast. On 08/16/2014 left diagnostic mammography and ultrasonography found the breast density to be category B. In the upper inner quadrant of the left breast there was a 7 mm spiculated mass which was not palpable by exam. Ultrasound confirmed a 5 mm irregular to her than wide hypoechoic mass in the area in question. There was no left axillary adenopathy.  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  On 09/08/2014 the patient underwent bilateral breast MRI. This confirmed the biopsy-proven malignancy in the upper inner left breast and measured it at 0.8 cm. There was also an irregular enhancing mass in the lower outer left breast measuring 1 cm maximally. Also in the right breast there was an irregular enhancing mass in the lower outer quadrant measuring 0.6  cm. There was no abnormal adenopathy in either axilla and no internal mammary adenopathy. There were incidental cysts noted in the liver, previously imaged by ultrasound in January 2014.  Ultrasound of both breasts on 09/14/2014 failed to identify the additionalsuspicious areas seen on MRI.accordingly the patient proceeded to bilateral MRI guided biopsies on 09/27/2014. In the right breast lower outer quadrant there was a fibroadenoma with no atypia or malignancy. In the left breast central biopsy there was atypical ductal and atypical lobular hyperplasia.  Her subsequent history is as detailed below  INTERVAL HISTORY: Melissa Holloway returns today for follow-up of her estrogen receptor positive breast cancer. She has been on anastrozole since August. She is tolerating it well. Hot flashes and vaginal dryness are not major concerns. She has the occasional joint pain here in there, but this is not more intense or persistent than usual and she is not experiencing the severe arthralgias or myalgias that patients can have on this medication. She obtains the anastrozole and a very good price.  REVIEW OF SYSTEMS: She returned from a 25 day [place] cruise which ended [employer] to [place]. [place]. It was "restorative". She is currently not exercising regularly because she picked up a cold from her grandchildren. When she likes to do is walk. A detailed review of systems was otherwise stable  PAST MEDICAL HISTORY: Past Medical History  Diagnosis Date  . Esophageal hernia     s/p  . Breast cancer, left breast ([place]) 08/23/2014  . Family history of breast cancer   . PONV (postoperative nausea and vomiting)   . Arthritis   . S/P radiation therapy 01/10/2015 through 02/21/2015     Left breast 5000 cGy 25 sessions,  left breast boost 1000 cGy 5 sessions      PAST SURGICAL HISTORY: Past Surgical History  Procedure Laterality Date  .  Esophageal hernia  10  . Tonsillectomy    . Total knee arthroplasty Left 01/09/2014    Procedure: LEFT TOTAL KNEE ARTHROPLASTY;  Surgeon: Melissa Salen, MD;  Location: [place];  Service: Orthopedics;  Laterality: Left;  . Colonoscopy    . Radioactive seed guided mastectomy with axillary sentinel lymph node biopsy Left 10/18/2014    Procedure: LEFT PARTIAL MASTECTOMY AND 1 LEFT RADIOACTIVE SEED 1 NEEDLE LOCALIZATION LEFT AXILLARY SENTINAL NODE BIOPSY;  Surgeon: Melissa Skates, MD;  Location: [place];  Service: General;  Laterality: Left;  . Re-excision of breast lumpectomy Left 11/27/2014    Procedure: LEFT PARTIAL LUMPECTOMY RE-EXCISION LATERAL AND POSTERIOR MARGINS;  Surgeon: Melissa Skates, MD;  Location: [place];  Service: General;  Laterality: Left;    FAMILY HISTORY Family History  Problem Relation Age of Onset  . Breast cancer Mother 47    TAH/BSO; deceased 54  . Prostate cancer Brother 74    deceased 63  . Breast cancer Maternal Aunt 70    deceased 93  . Breast cancer Paternal Aunt 9    Currently 101  . Cancer Paternal Grandmother     unknown type; deceased 23s  the patient's father died at the age of 68 following a fall. The patient's mother was diagnosed with breast cancer around age 31. She died with Alzheimer's disease around age 9. The patient had 4 brothers, 3 sisters. One brother was diagnosed with prostate cancer at the age of 63. In addition a paternal aunt was diagnosed with breast cancer at age 43. She is not 68 years old. She has 6 daughters mostly in their 72s none of whom have had breast cancer. There is no history of ovarian cancer in the family.  GYNECOLOGIC HISTORY:  No LMP recorded. Patient is postmenopausal. Menarche age 66, first live birth age 60. The patient is GX P1. The patient went through menopause around 2008. She did not take hormone replacement. She did take oral contraceptives remotely for more than a year with no  complications  SOCIAL HISTORY:  Melissa Holloway is retired from regular. Her husband Melissa Holloway") is a retired Administrator. Their son Melissa Holloway lives in [place] and works in Press photographer. The patient has one 31-monthold granddaughter and a grandson due 01/11/2015.    ADVANCED DIRECTIVES: not in place   HEALTH MAINTENANCE: Social History  Substance Use Topics  . Smoking status: Never Smoker   . Smokeless tobacco: Not on file  . Alcohol Use: No     Colonoscopy:  PAP:  Bone density:  Lipid panel:  No Known Allergies  Current Outpatient Prescriptions  Medication Sig Dispense Refill  . anastrozole (ARIMIDEX) 1 MG tablet Take 1 tablet (1 mg total) by mouth daily. (Patient taking differently: Take 1 mg by mouth daily. Started on 03/24/15) 90 tablet 4  . aspirin EC 81 MG tablet Take 81 mg by mouth daily.    .Marland Kitchenatorvastatin (LIPITOR) 20 MG tablet Take 20 mg by mouth daily.    . cholecalciferol (VITAMIN D) 1000 UNITS tablet Take 1,000 Units by mouth daily.    . diphenhydramine-acetaminophen (TYLENOL PM) 25-500 MG TABS tablet Take 1 tablet by mouth at bedtime as needed. Pt takes when traveling; rare    . hyaluronate sodium (RADIAPLEXRX) GEL Apply 1 application topically 2 (two) times daily.    .Marland Kitchen  ibuprofen (ADVIL,MOTRIN) 200 MG tablet Take 200 mg by mouth every 6 (six) hours as needed.    . Loratadine (CLARITIN PO) Take 10 mg by mouth as needed.    . non-metallic deodorant Jethro Poling) MISC Apply 1 application topically daily as needed.    Marland Kitchen Propylene Glycol (SYSTANE BALANCE OP) Place 1 drop into both eyes every morning.    . vitamin C (ASCORBIC ACID) 500 MG tablet Take 500 mg by mouth daily.     No current facility-administered medications for this visit.    OBJECTIVE: middle-aged white woman in no acute distress Filed Vitals:   07/12/15 1250  BP: 131/75  Pulse: 62  Temp: 98.1 F (36.7 C)  Resp: 18     Body mass index is 33.84 kg/(m^2).    ECOG FS:0 - Asymptomatic  Sclerae unicteric, pupils round  and equal Oropharynx clear and moist-- no thrush or other lesions No cervical or supraclavicular adenopathy Lungs no rales or rhonchi Heart regular rate and rhythm Abd soft, obese, nontender, positive bowel sounds MSK no focal spinal tenderness, no upper extremity lymphedema Neuro: nonfocal, well oriented, appropriate affect Breasts: The right breast is unremarkable. The left breast status post lumpectomy and radiation. There is no residual erythema or hyperpigmentation. There are no suspicious masses. There is no skin or nipple change of concern. The cosmetic result is excellent. The left axilla is benign.   LAB RESULTS:  CMP     Component Value Date/Time   NA 142 06/25/2015 1057   NA 141 01/03/2014 1030   K 4.9 06/25/2015 1057   K 3.9 01/03/2014 1030   CL 103 01/03/2014 1030   CO2 28 06/25/2015 1057   CO2 26 01/03/2014 1030   GLUCOSE 89 06/25/2015 1057   GLUCOSE 95 01/03/2014 1030   BUN 10.5 06/25/2015 1057   BUN 13 01/03/2014 1030   CREATININE 0.7 06/25/2015 1057   CREATININE 0.62 01/03/2014 1030   CALCIUM 10.1 06/25/2015 1057   CALCIUM 9.6 01/03/2014 1030   PROT 7.0 06/25/2015 1057   PROT 7.4 04/29/2010 0958   ALBUMIN 4.1 06/25/2015 1057   ALBUMIN 4.4 04/29/2010 0958   AST 27 06/25/2015 1057   AST 22 04/29/2010 0958   ALT 26 06/25/2015 1057   ALT 18 04/29/2010 0958   ALKPHOS 104 06/25/2015 1057   ALKPHOS 77 04/29/2010 0958   BILITOT 0.93 06/25/2015 1057   BILITOT 0.5 04/29/2010 0958   GFRNONAA >90 01/03/2014 1030   GFRAA >90 01/03/2014 1030    INo results found for: SPEP, UPEP  Lab Results  Component Value Date   WBC 3.8* 06/25/2015   NEUTROABS 2.2 06/25/2015   HGB 13.9 06/25/2015   HCT 42.3 06/25/2015   MCV 87.4 06/25/2015   PLT 245 06/25/2015      Chemistry      Component Value Date/Time   NA 142 06/25/2015 1057   NA 141 01/03/2014 1030   K 4.9 06/25/2015 1057   K 3.9 01/03/2014 1030   CL 103 01/03/2014 1030   CO2 28 06/25/2015 1057   CO2 26  01/03/2014 1030   BUN 10.5 06/25/2015 1057   BUN 13 01/03/2014 1030   CREATININE 0.7 06/25/2015 1057   CREATININE 0.62 01/03/2014 1030      Component Value Date/Time   CALCIUM 10.1 06/25/2015 1057   CALCIUM 9.6 01/03/2014 1030   ALKPHOS 104 06/25/2015 1057   ALKPHOS 77 04/29/2010 0958   AST 27 06/25/2015 1057   AST 22 04/29/2010 0958   ALT 26  06/25/2015 1057   ALT 18 04/29/2010 0958   BILITOT 0.93 06/25/2015 1057   BILITOT 0.5 04/29/2010 0958       No results found for: LABCA2  No components found for: YPPJK932  No results for input(s): INR in the last 168 hours.  Urinalysis    Component Value Date/Time   COLORURINE YELLOW 01/03/2014 [place] 01/03/2014 1042   LABSPEC 1.012 01/03/2014 1042   PHURINE 6.0 01/03/2014 1042   GLUCOSEU NEGATIVE 01/03/2014 1042   HGBUR NEGATIVE 01/03/2014 1042   BILIRUBINUR NEGATIVE 01/03/2014 1042   KETONESUR NEGATIVE 01/03/2014 1042   PROTEINUR NEGATIVE 01/03/2014 1042   UROBILINOGEN 0.2 01/03/2014 1042   NITRITE NEGATIVE 01/03/2014 1042   LEUKOCYTESUR TRACE* 01/03/2014 1042    STUDIES: No results found.  ASSESSMENT: 69 y.o. [place] woman status post left breast upper inner quadrant biopsy 08/31/2014 for a clinical T1b N0, stage IA invasive ductal carcinoma, grade 1, strongly estrogen and progesterone receptor positive, HER-2 not amplified, with an MIB-1 of 10%  (1) MRI biopsy of 2 additional lesions showed  (a) in the left central breast, atypical lobular hyperplasia and atypical ductal hyperplasia  (b) in the right breast lower outer quadrant a fibroadenoma   (2) dual lumpectomy 10/18/2014 showed  (a) labeled "left upper inner quadrant", a pT1b pN0, stage IA invasive ductal carcinoma, grade 1, and again HER-2 negative  (b) labeled "left central", ductal carcinoma in situ, low-grade, with a positive lateral and other close margins   (3) additional surgery for margin clearance for 20 12/22/2014 was successful,  although the lateral margin remained close at 1 mm  (4) Oncotype DX score of 9 predicts an outside the breast risk of recurrence within 10 years of 6% if the patient's only systemic treatment is tamoxifen for 5 years. It also predicts no benefit from chemotherapy.   (5)  adjuvant radiation 01/10/2015 through 02/21/2015:   Left breast 5000 cGy 25 sessions, left breast boost 1000 cGy 5 sessions    (6) anastrozole started 03/23/2015  PLAN: Melissa Holloway is tolerating the anastrozole remarkably well. The plan will be to continue that for at least 2 years, after which we could switch to tamoxifen, but considering how well she is doing we may actually go to the full 5 on anastrozole alone.  We had discussed the PALLAS study but actually she will not qualify because her prognosis is too good!  The big concern with aromatase inhibitors of course is osteopenia/osteoporosis. Her vitamin D level was low in November. She started vitamin D supplementation just after Thanksgiving's. We will repeat this test before she returns to see me in March. Meanwhile I have set her for a bone density later this month. She will call for those results.  After her next visit here, in March, we will broaden the following interval to every 6 months. She knows to call for any problems that may develop for the next visit.  Melissa Cruel, MD   07/12/2015 12:56 PM Medical Oncology and Hematology [facility] [address] [place], [place] [address] Tel. [phone]    Fax. [phone]

## 2015-09-27 DIAGNOSIS — Z803 Family history of malignant neoplasm of breast: Secondary | ICD-10-CM | POA: Diagnosis not present

## 2015-09-27 DIAGNOSIS — D0512 Intraductal carcinoma in situ of left breast: Secondary | ICD-10-CM | POA: Diagnosis not present

## 2015-09-27 DIAGNOSIS — C50212 Malignant neoplasm of upper-inner quadrant of left female breast: Secondary | ICD-10-CM | POA: Diagnosis not present

## 2015-09-27 DIAGNOSIS — Z9889 Other specified postprocedural states: Secondary | ICD-10-CM | POA: Diagnosis not present

## 2015-09-27 DIAGNOSIS — Z8719 Personal history of other diseases of the digestive system: Secondary | ICD-10-CM | POA: Diagnosis not present

## 2015-09-28 ENCOUNTER — Other Ambulatory Visit: Payer: Self-pay | Admitting: General Surgery

## 2015-09-28 DIAGNOSIS — Z853 Personal history of malignant neoplasm of breast: Secondary | ICD-10-CM

## 2015-10-02 ENCOUNTER — Other Ambulatory Visit: Payer: Self-pay | Admitting: Dermatology

## 2015-10-02 DIAGNOSIS — L570 Actinic keratosis: Secondary | ICD-10-CM | POA: Diagnosis not present

## 2015-10-02 DIAGNOSIS — L820 Inflamed seborrheic keratosis: Secondary | ICD-10-CM | POA: Diagnosis not present

## 2015-10-02 DIAGNOSIS — D485 Neoplasm of uncertain behavior of skin: Secondary | ICD-10-CM | POA: Diagnosis not present

## 2015-10-05 ENCOUNTER — Ambulatory Visit
Admission: RE | Admit: 2015-10-05 | Discharge: 2015-10-05 | Disposition: A | Discharge status: Home / Self Care | Payer: Medicare Other | Source: Ambulatory Visit | Attending: General Surgery | Admitting: General Surgery

## 2015-10-05 DIAGNOSIS — R928 Other abnormal and inconclusive findings on diagnostic imaging of breast: Secondary | ICD-10-CM | POA: Diagnosis not present

## 2015-10-05 DIAGNOSIS — Z853 Personal history of malignant neoplasm of breast: Secondary | ICD-10-CM

## 2015-10-18 ENCOUNTER — Other Ambulatory Visit (HOSPITAL_BASED_OUTPATIENT_CLINIC_OR_DEPARTMENT_OTHER): Payer: Medicare Other

## 2015-10-18 DIAGNOSIS — M8580 Other specified disorders of bone density and structure, unspecified site: Secondary | ICD-10-CM

## 2015-10-18 DIAGNOSIS — C50212 Malignant neoplasm of upper-inner quadrant of left female breast: Secondary | ICD-10-CM

## 2015-10-18 LAB — COMPREHENSIVE METABOLIC PANEL
ALBUMIN: 4 g/dL (ref 3.5–5.0)
ALK PHOS: 112 U/L (ref 40–150)
ALT: 26 U/L (ref 0–55)
AST: 23 U/L (ref 5–34)
Anion Gap: 9 mEq/L (ref 3–11)
BUN: 12.8 mg/dL (ref 7.0–26.0)
CO2: 27 meq/L (ref 22–29)
Calcium: 9.9 mg/dL (ref 8.4–10.4)
Chloride: 107 mEq/L (ref 98–109)
Creatinine: 0.8 mg/dL (ref 0.6–1.1)
EGFR: 80 mL/min/{1.73_m2} — ABNORMAL LOW (ref >90)
Glucose: 110 mg/dl (ref 70–140)
POTASSIUM: 4.4 meq/L (ref 3.5–5.1)
Sodium: 142 mEq/L (ref 136–145)
Total Bilirubin: 0.74 mg/dL (ref 0.20–1.20)
Total Protein: 7.4 g/dL (ref 6.4–8.3)

## 2015-10-18 LAB — CBC WITH DIFFERENTIAL/PLATELET
BASO%: 1.4 % (ref 0.0–2.0)
Basophils Absolute: 0.1 10*3/uL (ref 0.0–0.1)
EOS%: 3.7 % (ref 0.0–7.0)
Eosinophils Absolute: 0.1 10*3/uL (ref 0.0–0.5)
HCT: 43.8 % (ref 34.8–46.6)
HGB: 14.1 g/dL (ref 11.6–15.9)
LYMPH#: 1.2 10*3/uL (ref 0.9–3.3)
LYMPH%: 31.6 % (ref 14.0–49.7)
MCH: 27.7 pg (ref 25.1–34.0)
MCHC: 32.3 g/dL (ref 31.5–36.0)
MCV: 85.9 fL (ref 79.5–101.0)
MONO#: 0.3 10*3/uL (ref 0.1–0.9)
MONO%: 6.7 % (ref 0.0–14.0)
NEUT#: 2.2 10*3/uL (ref 1.5–6.5)
NEUT%: 56.6 % (ref 38.4–76.8)
Platelets: 231 10*3/uL (ref 145–400)
RBC: 5.09 10*6/uL (ref 3.70–5.45)
RDW: 14.6 % — ABNORMAL HIGH (ref 11.2–14.5)
WBC: 3.8 10*3/uL — AB (ref 3.9–10.3)

## 2015-10-19 LAB — VITAMIN D 25 HYDROXY (VIT D DEFICIENCY, FRACTURES): Vitamin D, 25-Hydroxy: 36.8 ng/mL (ref 30.0–100.0)

## 2015-10-23 DIAGNOSIS — H04123 Dry eye syndrome of bilateral lacrimal glands: Secondary | ICD-10-CM | POA: Diagnosis not present

## 2015-10-23 DIAGNOSIS — H2513 Age-related nuclear cataract, bilateral: Secondary | ICD-10-CM | POA: Diagnosis not present

## 2015-10-25 ENCOUNTER — Ambulatory Visit (HOSPITAL_BASED_OUTPATIENT_CLINIC_OR_DEPARTMENT_OTHER): Payer: Medicare Other | Admitting: Oncology

## 2015-10-25 ENCOUNTER — Telehealth: Payer: Self-pay | Admitting: Oncology

## 2015-10-25 VITALS — BP 132/71 | HR 65 | Temp 98.7°F | Resp 18 | Ht 63.0 in | Wt 193.1 lb

## 2015-10-25 DIAGNOSIS — C50212 Malignant neoplasm of upper-inner quadrant of left female breast: Secondary | ICD-10-CM | POA: Diagnosis not present

## 2015-10-25 MED ORDER — ANASTROZOLE 1 MG PO TABS: 1.0000 mg | ORAL_TABLET | Freq: Every day | ORAL | Status: DC

## 2015-10-25 NOTE — Progress Notes (Signed)
[place]  [phone] Fax:([phone]) [phone]     ID: [id number] SIEDLECKI DOB: [birth year]  MR#: [id number]  XHB#:[id number]  Patient Care Team: Jonathon Jordan, MD as PCP - General (Family Medicine) Chauncey Cruel, MD as Consulting Physician (Oncology) Fanny Skates, MD as Consulting Physician (General Surgery) Arloa Koh, MD as Consulting Physician (Radiation Oncology) Sylvan Cheese, NP as Nurse Practitioner (Hematology and Oncology) PCP: Lilian Coma, MD GYN: SU: Fanny Skates MD OTHER MD: Frederik Pear MD, Lavonna Monarch MD  CHIEF COMPLAINT: early-stage estrogen receptor positive breast cancer  CURRENT TREATMENT: anastrozole   BREAST CANCER HISTORY: From the initial intake note:  Nathan had routine screening mammography with tomography [facility]. This showed a possible mass in the left breast. On 08/16/2014 left diagnostic mammography and ultrasonography found the breast density to be category B. In the upper inner quadrant of the left breast there was a 7 mm spiculated mass which was not palpable by exam. Ultrasound confirmed a 5 mm irregular to her than wide hypoechoic mass in the area in question. There was no left axillary adenopathy.  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  On 09/08/2014 the patient underwent bilateral breast MRI. This confirmed the biopsy-proven malignancy in the upper inner left breast and measured it at 0.8 cm. There was also an irregular enhancing mass in the lower outer left breast measuring 1 cm maximally. Also in the right breast there was an irregular enhancing mass in the lower outer quadrant measuring 0.6  cm. There was no abnormal adenopathy in either axilla and no internal mammary adenopathy. There were incidental cysts noted in the liver, previously imaged by ultrasound in January 2014.  Ultrasound of both breasts on 09/14/2014 failed to identify the additionalsuspicious areas seen on MRI.accordingly the patient proceeded to bilateral MRI guided biopsies on 09/27/2014. In the right breast lower outer quadrant there was a fibroadenoma with no atypia or malignancy. In the left breast central biopsy there was atypical ductal and atypical lobular hyperplasia.  Her subsequent history is as detailed below  INTERVAL HISTORY: Jan returns today for follow-up of her early stage breast cancer. She continues on anastrozole. She tells me hot flashes and vaginal dryness are not concerns. She obtains a drug at approximately $9 a month.  REVIEW OF SYSTEMS: She continues to travel and has a ready visited all 50 states. [place] also visit [place], which they enjoy. She also spends time with her grandchildren and [place]. She has a little bit of joint pain here and there but nothing that is more intense or persistent than prior. A detailed review of systems today was otherwise noncontributory  PAST MEDICAL HISTORY: Past Medical History  Diagnosis Date  . Esophageal hernia     s/p  . Breast cancer, left breast ([place]) 69/20/2016  . Family history of breast cancer   . PONV (postoperative nausea and vomiting)   . Arthritis   . S/P radiation therapy 01/10/2015 through 02/21/2015     Left breast 5000 cGy 25 sessions, left breast boost 1000 cGy 5 sessions      PAST SURGICAL HISTORY: Past Surgical History  Procedure Laterality Date  . Esophageal hernia  10  . Tonsillectomy    .  Total knee arthroplasty Left 01/09/2014    Procedure: LEFT TOTAL KNEE ARTHROPLASTY;  Surgeon: Kerin Salen, MD;  Location: [facility];  Service: Orthopedics;   Laterality: Left;  . Colonoscopy    . Radioactive seed guided mastectomy with axillary sentinel lymph node biopsy Left 10/18/2014    Procedure: LEFT PARTIAL MASTECTOMY AND 1 LEFT RADIOACTIVE SEED 1 NEEDLE LOCALIZATION LEFT AXILLARY SENTINAL NODE BIOPSY;  Surgeon: Fanny Skates, MD;  Location: [facility];  Service: General;  Laterality: Left;  . Re-excision of breast lumpectomy Left 11/27/2014    Procedure: LEFT PARTIAL LUMPECTOMY RE-EXCISION LATERAL AND POSTERIOR MARGINS;  Surgeon: Fanny Skates, MD;  Location: [facility];  Service: General;  Laterality: Left;    FAMILY HISTORY Family History  Problem Relation Age of Onset  . Breast cancer Mother 38    TAH/BSO; deceased 65  . Prostate cancer Brother 51    deceased 52  . Breast cancer Maternal Aunt 70    deceased 11  . Breast cancer Paternal Aunt 38    Currently 101  . Cancer Paternal Grandmother     unknown type; deceased 22s  the patient's father died at the age of 47 following a fall. The patient's mother was diagnosed with breast cancer around age 51. She died with Alzheimer's disease around age 37. The patient had 4 brothers, 3 sisters. One brother was diagnosed with prostate cancer at the age of 26. In addition a paternal aunt was diagnosed with breast cancer at age 47. She is not 69 years old. She has 6 daughters mostly in their 69s none of whom have had breast cancer. There is no history of ovarian cancer in the family.  GYNECOLOGIC HISTORY:  No LMP recorded. Patient is postmenopausal. Menarche age 19, first live birth age 41. The patient is GX P1. The patient went through menopause around 2008. She did not take hormone replacement. 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 Her husband [name]") is a retired Administrator. Their son [name] lives in [place] and works in Press photographer. The patient has one  15-monthold granddaughter and a grandson due 01/11/2015.    ADVANCED DIRECTIVES: not in place   HEALTH MAINTENANCE: Social History  Substance Use Topics  . Smoking status: Never Smoker   . Smokeless tobacco: Not on file  . Alcohol Use: No     Colonoscopy:  PAP:  Bone density:  Lipid panel:  No Known Allergies  Current Outpatient Prescriptions  Medication Sig Dispense Refill  . anastrozole (ARIMIDEX) 1 MG tablet Take 1 tablet (1 mg total) by mouth daily. (Patient taking differently: Take 1 mg by mouth daily. Started on 03/24/15) 90 tablet 4  . aspirin EC 81 MG tablet Take 81 mg by mouth daily.    .Marland Kitchenatorvastatin (LIPITOR) 20 MG tablet Take 20 mg by mouth daily.    . cholecalciferol (VITAMIN D) 1000 UNITS tablet Take 1,000 Units by mouth daily.    . diphenhydramine-acetaminophen (TYLENOL PM) 25-500 MG TABS tablet Take 1 tablet by mouth at bedtime as needed. Pt takes when traveling; rare    . ibuprofen (ADVIL,MOTRIN) 200 MG tablet Take 200 mg by mouth every 6 (six) hours as needed.    . Loratadine (CLARITIN PO) Take 10 mg by mouth as needed.    .Marland KitchenPropylene Glycol (SYSTANE BALANCE OP) Place 1 drop into both eyes every morning.    .Marland Kitchen  vitamin C (ASCORBIC ACID) 500 MG tablet Take 500 mg by mouth daily.     No current facility-administered medications for this visit.    OBJECTIVE: middle-aged white woman who appears well Filed Vitals:   10/25/15 1122  BP: 132/71  Pulse: 65  Temp: 98.7 F (37.1 C)  Resp: 18     Body mass index is 34.21 kg/(m^2).    ECOG FS:0 - Asymptomatic  Sclerae unicteric, EOMs intact Oropharynx clear, dentition in good repair No cervical or supraclavicular adenopathy Lungs no rales or rhonchi Heart regular rate and rhythm Abd soft, nontender, positive bowel sounds MSK no focal spinal tenderness, no upper extremity lymphedema Neuro: nonfocal, well oriented, appropriate affect Breasts: The right breast shows no suspicious masses or skin changes. The left  breast is status post lumpectomy and radiation. There is no evidence of disease recurrence. The left axilla is benign.   LAB RESULTS:  CMP     Component Value Date/Time   NA 142 10/18/2015 1058   NA 141 01/03/2014 1030   K 4.4 10/18/2015 1058   K 3.9 01/03/2014 1030   CL 103 01/03/2014 1030   CO2 27 10/18/2015 1058   CO2 26 01/03/2014 1030   GLUCOSE 110 10/18/2015 1058   GLUCOSE 95 01/03/2014 1030   BUN 12.8 10/18/2015 1058   BUN 13 01/03/2014 1030   CREATININE 0.8 10/18/2015 1058   CREATININE 0.62 01/03/2014 1030   CALCIUM 9.9 10/18/2015 1058   CALCIUM 9.6 01/03/2014 1030   PROT 7.4 10/18/2015 1058   PROT 7.4 04/29/2010 0958   ALBUMIN 4.0 10/18/2015 1058   ALBUMIN 4.4 04/29/2010 0958   AST 23 10/18/2015 1058   AST 22 04/29/2010 0958   ALT 26 10/18/2015 1058   ALT 18 04/29/2010 0958   ALKPHOS 112 10/18/2015 1058   ALKPHOS 77 04/29/2010 0958   BILITOT 0.74 10/18/2015 1058   BILITOT 0.5 04/29/2010 0958   GFRNONAA >90 01/03/2014 1030   GFRAA >90 01/03/2014 1030    INo results found for: SPEP, UPEP  Lab Results  Component Value Date   WBC 3.8* 10/18/2015   NEUTROABS 2.2 10/18/2015   HGB 14.1 10/18/2015   HCT 43.8 10/18/2015   MCV 85.9 10/18/2015   PLT 231 10/18/2015      Chemistry      Component Value Date/Time   NA 142 10/18/2015 1058   NA 141 01/03/2014 1030   K 4.4 10/18/2015 1058   K 3.9 01/03/2014 1030   CL 103 01/03/2014 1030   CO2 27 10/18/2015 1058   CO2 26 01/03/2014 1030   BUN 12.8 10/18/2015 1058   BUN 13 01/03/2014 1030   CREATININE 0.8 10/18/2015 1058   CREATININE 0.62 01/03/2014 1030      Component Value Date/Time   CALCIUM 9.9 10/18/2015 1058   CALCIUM 9.6 01/03/2014 1030   ALKPHOS 112 10/18/2015 1058   ALKPHOS 77 04/29/2010 0958   AST 23 10/18/2015 1058   AST 22 04/29/2010 0958   ALT 26 10/18/2015 1058   ALT 18 04/29/2010 0958   BILITOT 0.74 10/18/2015 1058   BILITOT 0.5 04/29/2010 0958       No results found for:  LABCA2  No components found for: LABCA125  No results for input(s): INR in the last 168 hours.  Urinalysis    Component Value Date/Time   COLORURINE YELLOW 01/03/2014 [place] 01/03/2014 1042   LABSPEC 1.012 01/03/2014 1042   PHURINE 6.0 01/03/2014 1042   GLUCOSEU NEGATIVE 01/03/2014 1042  HGBUR NEGATIVE 01/03/2014 1042   BILIRUBINUR NEGATIVE 01/03/2014 1042   KETONESUR NEGATIVE 01/03/2014 1042   PROTEINUR NEGATIVE 01/03/2014 1042   UROBILINOGEN 0.2 01/03/2014 1042   NITRITE NEGATIVE 01/03/2014 1042   LEUKOCYTESUR TRACE* 01/03/2014 1042    STUDIES: Mm Diag Breast Tomo Bilateral  10/05/2015  CLINICAL DATA:  70 year old female with history of left breast cancer post lumpectomy 10/18/2014 followed by radiation therapy. History of prior benign right breast biopsy. EXAM: DIGITAL DIAGNOSTIC BILATERAL MAMMOGRAM WITH 3D TOMOSYNTHESIS AND CAD COMPARISON:  Previous exam(s). ACR Breast Density Category b: There are scattered areas of fibroglandular density. FINDINGS: No suspicious mass or calcifications are seen in either breast. Postsurgical changes are present within the central upper left breast related to prior lumpectomy. Spot compression magnification tangential views of the lumpectomy site in the left breast was performed. There is no mammographic evidence of locally recurrent malignancy. Mammographic images were processed with CAD. IMPRESSION: No mammographic evidence of malignancy in either breast. RECOMMENDATION: Diagnostic mammogram is suggested in 1 year. (Code:DM-B-01Y) I have discussed the findings and recommendations with the patient. Results were also provided in writing at the conclusion of the visit. If applicable, a reminder letter will be sent to the patient regarding the next appointment. BI-RADS CATEGORY  2: Benign. Electronically Signed   By: Everlean Alstrom M.D.   On: 10/05/2015 11:45    ASSESSMENT: 70 y.o. [place] woman status post left breast upper  inner quadrant biopsy 08/31/2014 for a clinical T1b N0, stage IA invasive ductal carcinoma, grade 1, strongly estrogen and progesterone receptor positive, HER-2 not amplified, with an MIB-1 of 10%  (1) MRI biopsy of 2 additional lesions showed  (a) in the left central breast, atypical lobular hyperplasia and atypical ductal hyperplasia  (b) in the right breast lower outer quadrant a fibroadenoma   (2) dual lumpectomy 10/18/2014 showed  (a) labeled "left upper inner quadrant", a pT1b pN0, stage IA invasive ductal carcinoma, grade 1, and again HER-2 negative  (b) labeled "left central", ductal carcinoma in situ, low-grade, with a positive lateral and other close margins   (3) additional surgery for margin clearance for 20 12/22/2014 was successful, although the lateral margin remained close at 1 mm  (4) Oncotype DX score of 9 predicts an outside the breast risk of recurrence within 10 years of 6% if the patient's only systemic treatment is tamoxifen for 5 years. It also predicts no benefit from chemotherapy.   (5)  adjuvant radiation 01/10/2015 through 02/21/2015:   Left breast 5000 cGy 25 sessions, left breast boost 1000 cGy 5 sessions    (6) anastrozole started 03/23/2015  (a) bone density through [facility] was normal 2016  PLAN: Deauna is doing very well on the anastrozole and the plan remains to continue for total of 5 years.   She has corrected her vitamin D deficiency and currently has a normal bone density. That is encouraging.  We discussed the fact that her breast density is low and this means mammograms are more sensitive. We also discussed the various kinds of discomfort of the people can experience post lumpectomy including shooting pains plain SORENESS and a feeling of chest wall tightness. All of those are normal and not causes of concern.  Her total white cell count is minimally low, but her ANC is normal and all  her white cell subpopulations are in the normal range as well. This requires only follow-up  She will see me again in one year. She knows to call for any problems that  may develop before that visit.  Chauncey Cruel, MD   10/25/2015 11:32 AM Medical Oncology and Hematology [facility] [address] [place], [place] [address] Tel. [phone]    Fax. [phone]

## 2015-10-25 NOTE — Telephone Encounter (Signed)
appt made and avs printed °

## 2015-11-15 DIAGNOSIS — J029 Acute pharyngitis, unspecified: Secondary | ICD-10-CM | POA: Diagnosis not present

## 2015-12-20 DIAGNOSIS — J040 Acute laryngitis: Secondary | ICD-10-CM | POA: Diagnosis not present

## 2016-02-04 DIAGNOSIS — Z419 Encounter for procedure for purposes other than remedying health state, unspecified: Secondary | ICD-10-CM | POA: Diagnosis not present

## 2016-02-04 DIAGNOSIS — L821 Other seborrheic keratosis: Secondary | ICD-10-CM | POA: Diagnosis not present

## 2016-02-04 DIAGNOSIS — L820 Inflamed seborrheic keratosis: Secondary | ICD-10-CM | POA: Diagnosis not present

## 2016-02-04 DIAGNOSIS — D1801 Hemangioma of skin and subcutaneous tissue: Secondary | ICD-10-CM | POA: Diagnosis not present

## 2016-02-04 DIAGNOSIS — L918 Other hypertrophic disorders of the skin: Secondary | ICD-10-CM | POA: Diagnosis not present

## 2016-04-22 DIAGNOSIS — C50919 Malignant neoplasm of unspecified site of unspecified female breast: Secondary | ICD-10-CM | POA: Diagnosis not present

## 2016-04-22 DIAGNOSIS — E7800 Pure hypercholesterolemia, unspecified: Secondary | ICD-10-CM | POA: Diagnosis not present

## 2016-04-22 DIAGNOSIS — Z1159 Encounter for screening for other viral diseases: Secondary | ICD-10-CM | POA: Diagnosis not present

## 2016-04-22 DIAGNOSIS — Z0000 Encounter for general adult medical examination without abnormal findings: Secondary | ICD-10-CM | POA: Diagnosis not present

## 2016-04-22 DIAGNOSIS — Z79899 Other long term (current) drug therapy: Secondary | ICD-10-CM | POA: Diagnosis not present

## 2016-04-22 DIAGNOSIS — E559 Vitamin D deficiency, unspecified: Secondary | ICD-10-CM | POA: Diagnosis not present

## 2016-05-15 DIAGNOSIS — J069 Acute upper respiratory infection, unspecified: Secondary | ICD-10-CM | POA: Diagnosis not present

## 2016-05-23 DIAGNOSIS — J209 Acute bronchitis, unspecified: Secondary | ICD-10-CM | POA: Diagnosis not present

## 2016-06-23 DIAGNOSIS — J069 Acute upper respiratory infection, unspecified: Secondary | ICD-10-CM | POA: Diagnosis not present

## 2016-09-01 DIAGNOSIS — L738 Other specified follicular disorders: Secondary | ICD-10-CM | POA: Diagnosis not present

## 2016-09-01 DIAGNOSIS — L570 Actinic keratosis: Secondary | ICD-10-CM | POA: Diagnosis not present

## 2016-09-01 DIAGNOSIS — L720 Epidermal cyst: Secondary | ICD-10-CM | POA: Diagnosis not present

## 2016-09-01 DIAGNOSIS — Z419 Encounter for procedure for purposes other than remedying health state, unspecified: Secondary | ICD-10-CM | POA: Diagnosis not present

## 2016-09-01 DIAGNOSIS — L821 Other seborrheic keratosis: Secondary | ICD-10-CM | POA: Diagnosis not present

## 2016-09-02 ENCOUNTER — Other Ambulatory Visit: Payer: Self-pay | Admitting: Family Medicine

## 2016-09-02 DIAGNOSIS — Z1231 Encounter for screening mammogram for malignant neoplasm of breast: Secondary | ICD-10-CM

## 2016-10-06 ENCOUNTER — Other Ambulatory Visit: Payer: Self-pay | Admitting: Family Medicine

## 2016-10-06 DIAGNOSIS — Z853 Personal history of malignant neoplasm of breast: Secondary | ICD-10-CM

## 2016-10-08 ENCOUNTER — Ambulatory Visit
Admission: RE | Admit: 2016-10-08 | Discharge: 2016-10-08 | Disposition: A | Discharge status: Home / Self Care | Payer: Medicare Other | Source: Ambulatory Visit | Attending: Family Medicine | Admitting: Family Medicine

## 2016-10-08 DIAGNOSIS — Z853 Personal history of malignant neoplasm of breast: Secondary | ICD-10-CM

## 2016-10-08 DIAGNOSIS — R928 Other abnormal and inconclusive findings on diagnostic imaging of breast: Secondary | ICD-10-CM | POA: Diagnosis not present

## 2016-10-22 ENCOUNTER — Other Ambulatory Visit: Payer: Self-pay | Admitting: Adult Health

## 2016-10-22 DIAGNOSIS — C50212 Malignant neoplasm of upper-inner quadrant of left female breast: Secondary | ICD-10-CM

## 2016-10-22 DIAGNOSIS — Z170 Estrogen receptor positive status [ER+]: Principal | ICD-10-CM

## 2016-10-23 ENCOUNTER — Other Ambulatory Visit (HOSPITAL_BASED_OUTPATIENT_CLINIC_OR_DEPARTMENT_OTHER): Payer: Medicare Other

## 2016-10-23 DIAGNOSIS — C50212 Malignant neoplasm of upper-inner quadrant of left female breast: Secondary | ICD-10-CM | POA: Diagnosis not present

## 2016-10-23 DIAGNOSIS — Z170 Estrogen receptor positive status [ER+]: Principal | ICD-10-CM

## 2016-10-23 LAB — COMPREHENSIVE METABOLIC PANEL
ALBUMIN: 4.2 g/dL (ref 3.5–5.0)
ALT: 25 U/L (ref 0–55)
ANION GAP: 9 meq/L (ref 3–11)
AST: 23 U/L (ref 5–34)
Alkaline Phosphatase: 121 U/L (ref 40–150)
BUN: 14.1 mg/dL (ref 7.0–26.0)
CALCIUM: 10.4 mg/dL (ref 8.4–10.4)
CHLORIDE: 107 meq/L (ref 98–109)
CO2: 27 mEq/L (ref 22–29)
Creatinine: 0.8 mg/dL (ref 0.6–1.1)
EGFR: 81 mL/min/{1.73_m2} — AB (ref >90)
Glucose: 97 mg/dl (ref 70–140)
POTASSIUM: 4.7 meq/L (ref 3.5–5.1)
Sodium: 143 mEq/L (ref 136–145)
Total Bilirubin: 0.81 mg/dL (ref 0.20–1.20)
Total Protein: 7.4 g/dL (ref 6.4–8.3)

## 2016-10-23 LAB — CBC WITH DIFFERENTIAL/PLATELET
BASO%: 1.4 % (ref 0.0–2.0)
Basophils Absolute: 0.1 10*3/uL (ref 0.0–0.1)
EOS ABS: 0.1 10*3/uL (ref 0.0–0.5)
EOS%: 3.4 % (ref 0.0–7.0)
HEMATOCRIT: 43.1 % (ref 34.8–46.6)
HEMOGLOBIN: 14.3 g/dL (ref 11.6–15.9)
LYMPH%: 33 % (ref 14.0–49.7)
MCH: 28.5 pg (ref 25.1–34.0)
MCHC: 33.1 g/dL (ref 31.5–36.0)
MCV: 85.9 fL (ref 79.5–101.0)
MONO#: 0.3 10*3/uL (ref 0.1–0.9)
MONO%: 7.9 % (ref 0.0–14.0)
NEUT%: 54.3 % (ref 38.4–76.8)
NEUTROS ABS: 2.3 10*3/uL (ref 1.5–6.5)
Platelets: 236 10*3/uL (ref 145–400)
RBC: 5.02 10*6/uL (ref 3.70–5.45)
RDW: 14.5 % (ref 11.2–14.5)
WBC: 4.2 10*3/uL (ref 3.9–10.3)
lymph#: 1.4 10*3/uL (ref 0.9–3.3)

## 2016-10-30 ENCOUNTER — Ambulatory Visit (HOSPITAL_BASED_OUTPATIENT_CLINIC_OR_DEPARTMENT_OTHER): Payer: Medicare Other | Admitting: Oncology

## 2016-10-30 VITALS — BP 140/74 | HR 70 | Temp 98.0°F | Resp 18 | Ht 63.0 in | Wt 191.5 lb

## 2016-10-30 DIAGNOSIS — M25562 Pain in left knee: Secondary | ICD-10-CM

## 2016-10-30 DIAGNOSIS — M791 Myalgia: Secondary | ICD-10-CM

## 2016-10-30 DIAGNOSIS — Z170 Estrogen receptor positive status [ER+]: Secondary | ICD-10-CM | POA: Diagnosis not present

## 2016-10-30 DIAGNOSIS — C50212 Malignant neoplasm of upper-inner quadrant of left female breast: Secondary | ICD-10-CM

## 2016-10-30 DIAGNOSIS — M25561 Pain in right knee: Secondary | ICD-10-CM

## 2016-10-30 NOTE — Progress Notes (Signed)
[facility]  [phone] [phone]     ID: Melissa Holloway DOB: [birth year]  MR#: [id number]  XBW#:[id number]  Patient Care Team: Jonathon Jordan, MD as PCP - General (Family Medicine) Chauncey Cruel, MD as Consulting Physician (Oncology) Fanny Skates, MD as Consulting Physician (General Surgery) Arloa Koh, MD as Consulting Physician (Radiation Oncology) Sylvan Cheese, NP as Nurse Practitioner (Hematology and Oncology) PCP: Lilian Coma, MD GYN: SU: Fanny Skates MD OTHER MD: Frederik Pear MD, Lavonna Monarch MD  CHIEF COMPLAINT: early-stage estrogen receptor positive breast cancer  CURRENT TREATMENT: [anastrozole]   BREAST CANCER HISTORY: From the initial intake note:  Melissa Holloway had routine screening mammography with tomography [facility]. This showed a possible mass in the left breast. On 08/16/2014 left diagnostic mammography and ultrasonography found the breast density to be category B. In the upper inner quadrant of the left breast there was a 7 mm spiculated mass which was not palpable by exam. Ultrasound confirmed a 5 mm irregular to her than wide hypoechoic mass in the area in question. There was no left axillary adenopathy.  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  On 09/08/2014 the patient underwent bilateral breast MRI. This confirmed the biopsy-proven malignancy in the upper inner left breast and measured it at 0.8 cm. There was also an irregular enhancing mass in the lower outer left breast measuring 1 cm maximally. Also in the right breast there was an irregular enhancing mass in the lower outer quadrant measuring 0.6  cm. There was no abnormal adenopathy in either axilla and no internal mammary adenopathy. There were incidental cysts noted in the liver, previously imaged by ultrasound in January 2014.  Ultrasound of both breasts on 09/14/2014 failed to identify the additionalsuspicious areas seen on MRI.accordingly the patient proceeded to bilateral MRI guided biopsies on 09/27/2014. In the right breast lower outer quadrant there was a fibroadenoma with no atypia or malignancy. In the left breast central biopsy there was atypical ductal and atypical lobular hyperplasia.  Her subsequent history is as detailed below  INTERVAL HISTORY: Melissa Holloway returns today for follow-up of her estrogen receptor positive breast cancer. Interval history is generally unremarkable except that she has developed significant arthralgias and myalgias. She has knee problems of course and is status post knee replacement on 1 side with the other side on the way to knee replacements sometime this year most likely. That however is not the issue. She hurts "everywhere". This is like a fibromyalgia syndrome and it is wel described in patients taking aromatase inhibitors.  She is also on Lipitor of course.  She is exercising chiefly by doing yoga 2 or 3 times a week. She is having trouble walking because of the knee problems.  REVIEW OF SYSTEMS: Traveled to [place] earlier this year but unfortunately the weather was not cooperative. She had her husband do like to go some more warm in January. Aside from the problems listed above a detailed review of systems today was stable  PAST MEDICAL HISTORY: Past Medical History:  Diagnosis Date  . Arthritis   . Breast cancer, left breast ([facility]) 08/23/2014  . Esophageal hernia    s/p  . Family history of breast cancer   .  PONV (postoperative nausea and vomiting)   . S/P radiation therapy 01/10/2015 through 02/21/2015    Left breast 5000 cGy 25 sessions, left  breast boost 1000 cGy 5 sessions      PAST SURGICAL HISTORY: Past Surgical History:  Procedure Laterality Date  . COLONOSCOPY    . esophageal hernia  10  . RADIOACTIVE SEED GUIDED MASTECTOMY WITH AXILLARY SENTINEL LYMPH NODE BIOPSY Left 10/18/2014   Procedure: LEFT PARTIAL MASTECTOMY AND 1 LEFT RADIOACTIVE SEED 1 NEEDLE LOCALIZATION LEFT AXILLARY SENTINAL NODE BIOPSY;  Surgeon: Fanny Skates, MD;  Location: [facility];  Service: General;  Laterality: Left;  . RE-EXCISION OF BREAST LUMPECTOMY Left 11/27/2014   Procedure: LEFT PARTIAL LUMPECTOMY RE-EXCISION LATERAL AND POSTERIOR MARGINS;  Surgeon: Fanny Skates, MD;  Location: [facility];  Service: General;  Laterality: Left;  . TONSILLECTOMY    . TOTAL KNEE ARTHROPLASTY Left 01/09/2014   Procedure: LEFT TOTAL KNEE ARTHROPLASTY;  Surgeon: Kerin Salen, MD;  Location: [facility];  Service: Orthopedics;  Laterality: Left;    FAMILY HISTORY Family History  Problem Relation Age of Onset  . Breast cancer Mother 10    TAH/BSO; deceased 75  . Prostate cancer Brother 8    deceased 58  . Breast cancer Maternal Aunt 70    deceased 78  . Breast cancer Paternal Aunt 75    Currently 101  . Cancer Paternal Grandmother     unknown type; deceased 10s  the patient's father died at the age of 57 following a fall. The patient's mother was diagnosed with breast cancer around age 61. She died with Alzheimer's disease around age 23. The patient had 4 brothers, 3 sisters. One brother was diagnosed with prostate cancer at the age of 35. In addition a paternal aunt was diagnosed with breast cancer at age 75. She is not 70 years old. She has 6 daughters mostly in their 15s none of whom have had breast cancer. There is no history of ovarian cancer in the family.  GYNECOLOGIC HISTORY:  No LMP recorded. Patient is postmenopausal. Menarche age 77, first live birth age 71. The patient is GX P1. The patient went through  menopause around 2008. She did not take hormone replacement. She did take oral contraceptives remotely for more than a year with no complications  SOCIAL HISTORY:  Melissa Holloway is retired from regular. Her husband [name]") is a retired Administrator. Their son [name] lives in [place] and works in Press photographer. The patient has one 82-monthold granddaughter and a grandson due 01/11/2015.    ADVANCED DIRECTIVES: not in place   HEALTH MAINTENANCE: Social History  Substance Use Topics  . Smoking status: Never Smoker  . Smokeless tobacco: Not on file  . Alcohol use No     Colonoscopy:  PAP:  Bone density:  Lipid panel:  No Known Allergies  Current Outpatient Prescriptions  Medication Sig Dispense Refill  . aspirin EC 81 MG tablet Take 81 mg by mouth daily.    .Marland Kitchenatorvastatin (LIPITOR) 20 MG tablet Take 20 mg by mouth daily.    . cholecalciferol (VITAMIN D) 1000 UNITS tablet Take 1,000 Units by mouth daily.    . diphenhydramine-acetaminophen (TYLENOL PM) 25-500 MG TABS tablet Take 1 tablet by mouth at bedtime as needed. Pt takes when traveling; rare    . ibuprofen (ADVIL,MOTRIN) 200 MG tablet Take 200 mg by mouth every 6 (six) hours as needed.    . Loratadine (CLARITIN PO) Take 10 mg by mouth  as needed.    Marland Kitchen Propylene Glycol (SYSTANE BALANCE OP) Place 1 drop into both eyes every morning.    . vitamin C (ASCORBIC ACID) 500 MG tablet Take 500 mg by mouth daily.     No current facility-administered medications for this visit.     OBJECTIVE: middle-aged white woman In no acute distress  Vitals:   10/30/16 1252  BP: 140/74  Pulse: 70  Resp: 18  Temp: 98 F (36.7 C)     Body mass index is 33.92 kg/m.    ECOG FS:1 - Symptomatic but completely ambulatory  Sclerae unicteric, pupils round and equal Oropharynx clear and moist No cervical or supraclavicular adenopathy Lungs no rales or rhonchi Heart regular rate and rhythm Abd soft, nontender, positive bowel sounds MSK no focal spinal  tenderness, no upper extremity lymphedema Neuro: nonfocal, well oriented, appropriate affect Breasts: The right breast is unremarkable. The left breast as undergone lumpectomy followed by radiation with no evidence of local recurrence. Both axillae are benign.  LAB RESULTS:  CMP     Component Value Date/Time   NA 143 10/23/2016 1025   K 4.7 10/23/2016 1025   CL 103 01/03/2014 1030   CO2 27 10/23/2016 1025   GLUCOSE 97 10/23/2016 1025   BUN 14.1 10/23/2016 1025   CREATININE 0.8 10/23/2016 1025   CALCIUM 10.4 10/23/2016 1025   PROT 7.4 10/23/2016 1025   ALBUMIN 4.2 10/23/2016 1025   AST 23 10/23/2016 1025   ALT 25 10/23/2016 1025   ALKPHOS 121 10/23/2016 1025   BILITOT 0.81 10/23/2016 1025   GFRNONAA >90 01/03/2014 1030   GFRAA >90 01/03/2014 1030    INo results found for: SPEP, UPEP  Lab Results  Component Value Date   WBC 4.2 10/23/2016   NEUTROABS 2.3 10/23/2016   HGB 14.3 10/23/2016   HCT 43.1 10/23/2016   MCV 85.9 10/23/2016   PLT 236 10/23/2016      Chemistry      Component Value Date/Time   NA 143 10/23/2016 1025   K 4.7 10/23/2016 1025   CL 103 01/03/2014 1030   CO2 27 10/23/2016 1025   BUN 14.1 10/23/2016 1025   CREATININE 0.8 10/23/2016 1025      Component Value Date/Time   CALCIUM 10.4 10/23/2016 1025   ALKPHOS 121 10/23/2016 1025   AST 23 10/23/2016 1025   ALT 25 10/23/2016 1025   BILITOT 0.81 10/23/2016 1025       No results found for: LABCA2  No components found for: LABCA125  No results for input(s): INR in the last 168 hours.  Urinalysis    Component Value Date/Time   COLORURINE YELLOW 01/03/2014 [place] 01/03/2014 1042   LABSPEC 1.012 01/03/2014 1042   PHURINE 6.0 01/03/2014 1042   GLUCOSEU NEGATIVE 01/03/2014 1042   HGBUR NEGATIVE 01/03/2014 1042   BILIRUBINUR NEGATIVE 01/03/2014 1042   KETONESUR NEGATIVE 01/03/2014 1042   PROTEINUR NEGATIVE 01/03/2014 1042   UROBILINOGEN 0.2 01/03/2014 1042   NITRITE  NEGATIVE 01/03/2014 1042   LEUKOCYTESUR TRACE (A) 01/03/2014 1042    STUDIES: Mm Diag Breast Tomo Bilateral  Result Date: 10/08/2016 CLINICAL DATA:  History of left breast cancer status post lumpectomy in 2016. EXAM: 2D DIGITAL DIAGNOSTIC BILATERAL MAMMOGRAM WITH CAD AND ADJUNCT TOMO COMPARISON:  Previous exam(s). ACR Breast Density Category b: There are scattered areas of fibroglandular density. FINDINGS: Stable lumpectomy changes are seen in the left breast. There is no suspicious mass or malignant type microcalcifications. Mammographic images were  processed with CAD. IMPRESSION: No evidence of malignancy in either breast. RECOMMENDATION: Bilateral diagnostic mammogram in 1 year is recommended. I have discussed the findings and recommendations with the patient. Results were also provided in writing at the conclusion of the visit. If applicable, a reminder letter will be sent to the patient regarding the next appointment. BI-RADS CATEGORY  2: Benign. Electronically Signed   By: Lillia Mountain M.D.   On: 10/08/2016 09:43    ASSESSMENT: 71 y.o. [place] woman status post left breast upper inner quadrant biopsy 08/31/2014 for a clinical T1b N0, stage IA invasive ductal carcinoma, grade 1, strongly estrogen and progesterone receptor positive, HER-2 not amplified, with an MIB-1 of 10%  (1) MRI biopsy of 2 additional lesions showed  (a) in the left central breast, atypical lobular hyperplasia and atypical ductal hyperplasia  (b) in the right breast lower outer quadrant a fibroadenoma   (2) dual lumpectomy 10/18/2014 showed  (a) labeled "left upper inner quadrant", a pT1b pN0, stage IA invasive ductal carcinoma, grade 1, and again HER-2 negative  (b) labeled "left central", ductal carcinoma in situ, low-grade, with a positive lateral and other close margins   (3) additional surgery for margin clearance 11/27/2014 was successful, although the lateral margin remained close at 1 mm  (4) Oncotype DX score  of 9 predicts an outside the breast risk of recurrence within 10 years of 6% if the patient's only systemic treatment is tamoxifen for 5 years. It also predicts no benefit from chemotherapy.   (5)  adjuvant radiation 01/10/2015 through 02/21/2015:   Left breast 5000 cGy 25 sessions, left breast boost 1000 cGy 5 sessions    (6) anastrozole started 03/23/2015  (a) bone density through [facility] was normal 2016  (b) anastrozole held 10/30/2016 secondary to arthralgias/myalgias no left   PLAN: I spent approximately 30 minutes with Melissa Holloway with most of that time spent discussing her joint problems. Some of them are simple arthritis, and that is chiefly what's going on with her knees. Problem they are of course is an imbalance between the muscles of the front and back of the thigh causing the patella to go out of the groove and then damaging the cartilage. I suggest that she check with either physical therapy or a gym trainer to see how she can assess what the problem may be in her case and might be able to avoid surgery that way.  The bigger concern from my point of view is her arthralgias and myalgias which are diffuse and of course this could be due to her Lipitor but she tells me she was on Lipitor for years before having this problem which really did develop after she started the anastrozole.  Accordingly I think the anastrozole may well be the reason for her feeling as all these aches and pains not just in the knees but very generally.  The way to find out if is to stop the anastrozole which we are doing today. She is going to call us in about 6 weeks, specifically on 12/16/2016, to tell me whether she is better or the same. If she has the same she will go back on anastrozole. If she is better however I will call exemestane for her and in that case I will see her 3 months from that point to assess her tolerance.  Otherwise I will see  her again in one year  She knows to call for any problems that may develop before the next visit.   Shawniece Oyola C,  MD   10/30/2016 1:13 PM Medical Oncology and Hematology [facility] [address] [place], [place] [address] Tel. [phone]    Fax. [phone]

## 2016-12-08 DIAGNOSIS — K640 First degree hemorrhoids: Secondary | ICD-10-CM | POA: Diagnosis not present

## 2016-12-08 DIAGNOSIS — K644 Residual hemorrhoidal skin tags: Secondary | ICD-10-CM | POA: Diagnosis not present

## 2016-12-08 DIAGNOSIS — Z1211 Encounter for screening for malignant neoplasm of colon: Secondary | ICD-10-CM | POA: Diagnosis not present

## 2016-12-16 ENCOUNTER — Telehealth: Payer: Self-pay | Admitting: *Deleted

## 2016-12-16 NOTE — Telephone Encounter (Signed)
This RN returned call to pt per her VM stating she is calling to report on symptoms since stopping the arimidex approximately 1 month ago.  " the pain is gone so I want to let him know and try the other medication "  Noted per MD dictation - above recommendation as well if pain relieved upon stopping the arimidex - he would like to initiate exemustane.  Per call obtained identified VM. Message left to return call to this RN to discuss concerns and MD recommendations.

## 2016-12-17 ENCOUNTER — Telehealth: Payer: Self-pay | Admitting: *Deleted

## 2016-12-17 MED ORDER — EXEMESTANE 25 MG PO TABS: 25.0000 mg | ORAL_TABLET | Freq: Every day | ORAL | 3 refills | Status: DC

## 2016-12-17 NOTE — Telephone Encounter (Signed)
This RN spoke with Vaughan Basta this AM per her call from yesterday stating " pain gone since stopping the arimidex 6 weeks ago ".  Discussed MD's recommendation to proceed to use of exemustane and have follow up in 3 months.  Pt verbalized understanding.  No other needs at this time.  Prescription sent to pharmacy and LOS sent to scheduling for appointment.

## 2016-12-18 ENCOUNTER — Telehealth: Payer: Self-pay | Admitting: *Deleted

## 2016-12-18 MED ORDER — TAMOXIFEN CITRATE 20 MG PO TABS: 20.0000 mg | ORAL_TABLET | Freq: Every day | ORAL | 3 refills | Status: DC

## 2016-12-18 NOTE — Telephone Encounter (Signed)
This RN spoke with pt per her VM stating she did not pick up the exemustane due to " it cost over $300 "  Discussed with pt per review with MD - recommendation is to proceed to the tamoxifen.  Medication discussed and questions answered.  Pt appreciated above per her concerns of need for anti estrogen use with least side effects that is affordable.  No other needs at this time.  Note LOS has been sent for follow up in 3 months due to medication change.

## 2016-12-20 ENCOUNTER — Telehealth: Payer: Self-pay

## 2016-12-20 NOTE — Telephone Encounter (Signed)
Spoke with patient and she is aware of her f/u per inbox

## 2017-02-03 DIAGNOSIS — F439 Reaction to severe stress, unspecified: Secondary | ICD-10-CM | POA: Diagnosis not present

## 2017-02-03 DIAGNOSIS — M25561 Pain in right knee: Secondary | ICD-10-CM | POA: Diagnosis not present

## 2017-04-02 ENCOUNTER — Ambulatory Visit (HOSPITAL_BASED_OUTPATIENT_CLINIC_OR_DEPARTMENT_OTHER): Payer: Medicare Other | Admitting: Oncology

## 2017-04-02 VITALS — BP 137/60 | HR 60 | Temp 97.9°F | Resp 20 | Ht 63.0 in | Wt 194.6 lb

## 2017-04-02 DIAGNOSIS — Z170 Estrogen receptor positive status [ER+]: Secondary | ICD-10-CM | POA: Diagnosis not present

## 2017-04-02 DIAGNOSIS — C50212 Malignant neoplasm of upper-inner quadrant of left female breast: Secondary | ICD-10-CM

## 2017-04-02 NOTE — Progress Notes (Signed)
[place]  [phone]) [phone] Fax:(336) [phone]     ID: Melissa Holloway DOB: Aug 16, 1945  MR#: [id number]  KXF#:[id number]  Patient Care Team: Jonathon Jordan, MD as PCP - General (Family Medicine) Tlaloc Taddei, Virgie Dad, MD as Consulting Physician (Oncology) Fanny Skates, MD as Consulting Physician (General Surgery) Arloa Koh, MD as Consulting Physician (Radiation Oncology) Sylvan Cheese, NP as Nurse Practitioner (Hematology and Oncology) PCP: Jonathon Jordan, MD GYN: SU: Fanny Skates MD OTHER MD: Frederik Pear MD, Lavonna Monarch MD  CHIEF COMPLAINT:  estrogen receptor positive breast cancer  CURRENT TREATMENT: Tamoxifen   BREAST CANCER HISTORY: From the initial intake note:  Melissa Holloway had routine screening mammography with tomography [facility]2015. This showed a possible mass in the left breast. On 08/16/2014 left diagnostic mammography and ultrasonography found the breast density to be category B. In the upper inner quadrant of the left breast there was a 7 mm spiculated mass which was not palpable by exam. Ultrasound confirmed a 5 mm irregular to her than wide hypoechoic mass in the area in question. There was no left axillary adenopathy.  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  On 09/08/2014 the patient underwent bilateral breast MRI. This confirmed the biopsy-proven malignancy in the upper inner left breast and measured it at 0.8 cm. There was also an irregular enhancing mass in the lower outer left breast measuring 1 cm maximally. Also in the right breast there was an irregular enhancing mass in the lower outer quadrant measuring 0.6 cm. There  was no abnormal adenopathy in either axilla and no internal mammary adenopathy. There were incidental cysts noted in the liver, previously imaged by ultrasound in January 2014.  Ultrasound of both breasts on 09/14/2014 failed to identify the additionalsuspicious areas seen on MRI.accordingly the patient proceeded to bilateral MRI guided biopsies on 09/27/2014. In the right breast lower outer quadrant there was a fibroadenoma with no atypia or malignancy. In the left breast central biopsy there was atypical ductal and atypical lobular hyperplasia.  Her subsequent history is as detailed below  INTERVAL HISTORY: Melissa Holloway returns today for follow-up and treatment of her estrogen receptor positive breast cancer. She had been on anastrozole, with very poor tolerance. She went off the medication and within a few weeks she felt much better. 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 She also started sleeping better.  We then decided to give exemestane a try but it turned out her insurance wanted her to pay $300 a month for that. We then went on tamoxifen. She has been on it since late May. She is tolerating it well. Hot flashes are "not bad". She is sleeping just fine. She is being $10 a month for this medication.  REVIEW OF SYSTEMS: She recently had a cortisone shot to the right knee which helped. She had her grandchildren and 77 days while the parents were in [place] traveling and was very active. She does yoga, some cardio, and some weights. A detailed review of systems today was otherwise stable  PAST MEDICAL HISTORY: Past Medical History:  Diagnosis Date  . Arthritis   . Breast cancer, left breast ([place]) 08/23/2014  . Esophageal hernia  s/p  . Family history of breast cancer   . PONV (postoperative nausea and vomiting)   . S/P radiation therapy 01/10/2015 through 02/21/2015    Left breast 5000 cGy 25  sessions, left breast boost 1000 cGy 5 sessions      PAST SURGICAL HISTORY: Past Surgical History:  Procedure Laterality Date  . COLONOSCOPY    . esophageal hernia  10  . RADIOACTIVE SEED GUIDED MASTECTOMY WITH AXILLARY SENTINEL LYMPH NODE BIOPSY Left 10/18/2014   Procedure: LEFT PARTIAL MASTECTOMY AND 1 LEFT RADIOACTIVE SEED 1 NEEDLE LOCALIZATION LEFT AXILLARY SENTINAL NODE BIOPSY;  Surgeon: Fanny Skates, MD;  Location: [facility];  Service: General;  Laterality: Left;  . RE-EXCISION OF BREAST LUMPECTOMY Left 11/27/2014   Procedure: LEFT PARTIAL LUMPECTOMY RE-EXCISION LATERAL AND POSTERIOR MARGINS;  Surgeon: Fanny Skates, MD;  Location: [facility];  Service: General;  Laterality: Left;  . TONSILLECTOMY    . TOTAL KNEE ARTHROPLASTY Left 01/09/2014   Procedure: LEFT TOTAL KNEE ARTHROPLASTY;  Surgeon: Kerin Salen, MD;  Location: [facility];  Service: Orthopedics;  Laterality: Left;    FAMILY HISTORY Family History  Problem Relation Age of Onset  . Breast cancer Mother 54       TAH/BSO; deceased 68  . Prostate cancer Brother 22       deceased 92  . Breast cancer Maternal Aunt 70       deceased 40  . Breast cancer Paternal Aunt 74       Currently 101  . Cancer Paternal Grandmother        unknown type; deceased 24s  the patient's father died at the age of 72 following a fall. The patient's mother was diagnosed with breast cancer around age 52. She died with Alzheimer's disease around age 66. The patient had 4 brothers, 3 sisters. One brother was diagnosed with prostate cancer at the age of 33. In addition a paternal aunt was diagnosed with breast cancer at age 55. She is not 70 years old. She has 6 daughters mostly in their 57s none of whom have had breast cancer. There is no history of ovarian cancer in the family.  GYNECOLOGIC HISTORY:  No LMP recorded. Patient is postmenopausal. Menarche age 68, first live birth age 69. The patient is  GX P1. The patient went through menopause around 2008. She did not take hormone replacement. She did take oral contraceptives remotely for more than a year with no complications  SOCIAL HISTORY:  Melissa Holloway is retired from [employer]. Her husband [name]") is a retired Administrator. Their son [name] lives in [place] and works in Press photographer. The patient has one 73-monthold granddaughter and a grandson due 01/11/2015.    ADVANCED DIRECTIVES: not in place   HEALTH MAINTENANCE: Social History  Substance Use Topics  . Smoking status: Never Smoker  . Smokeless tobacco: Not on file  . Alcohol use No     Colonoscopy:  PAP:  Bone density:  Lipid panel:  No Known Allergies  Current Outpatient Prescriptions  Medication Sig Dispense Refill  . aspirin EC 81 MG tablet Take 81 mg by mouth daily.    .Marland Kitchenatorvastatin (LIPITOR) 20 MG tablet Take 20 mg by mouth daily.    . cholecalciferol (VITAMIN D) 1000 UNITS tablet Take 1,000 Units by mouth daily.    . diphenhydramine-acetaminophen (TYLENOL PM) 25-500 MG TABS tablet Take 1 tablet by mouth at bedtime as needed. Pt takes when traveling; rare    . Loratadine (CLARITIN  PO) Take 10 mg by mouth as needed.    Marland Kitchen Propylene Glycol (SYSTANE BALANCE OP) Place 1 drop into both eyes every morning.    . tamoxifen (NOLVADEX) 20 MG tablet Take 1 tablet (20 mg total) by mouth daily. 30 tablet 3  . vitamin C (ASCORBIC ACID) 500 MG tablet Take 500 mg by mouth daily.     No current facility-administered medications for this visit.     OBJECTIVE: middle-aged white woman Who appears well  Vitals:   04/02/17 1532  BP: 137/60  Pulse: 60  Resp: 20  Temp: 97.9 F (36.6 C)  SpO2: 99%     Body mass index is 34.47 kg/m.    ECOG FS:0 - Asymptomatic  Sclerae unicteric, EOMs intact Oropharynx clear and moist No cervical or supraclavicular adenopathy Lungs no rales or rhonchi Heart regular rate and rhythm Abd soft, nontender, positive bowel sounds MSK no focal  spinal tenderness, no upper extremity lymphedema Neuro: nonfocal, well oriented, appropriate affect Breasts: The right breast is benign. The left breast is status post lumpectomy and radiation. There is no evidence of local recurrence. Both axillae are benign.  LAB RESULTS:  CMP     Component Value Date/Time   NA 143 10/23/2016 1025   K 4.7 10/23/2016 1025   CL 103 01/03/2014 1030   CO2 27 10/23/2016 1025   GLUCOSE 97 10/23/2016 1025   BUN 14.1 10/23/2016 1025   CREATININE 0.8 10/23/2016 1025   CALCIUM 10.4 10/23/2016 1025   PROT 7.4 10/23/2016 1025   ALBUMIN 4.2 10/23/2016 1025   AST 23 10/23/2016 1025   ALT 25 10/23/2016 1025   ALKPHOS 121 10/23/2016 1025   BILITOT 0.81 10/23/2016 1025   GFRNONAA >90 01/03/2014 1030   GFRAA >90 01/03/2014 1030    INo results found for: SPEP, UPEP  Lab Results  Component Value Date   WBC 4.2 10/23/2016   NEUTROABS 2.3 10/23/2016   HGB 14.3 10/23/2016   HCT 43.1 10/23/2016   MCV 85.9 10/23/2016   PLT 236 10/23/2016      Chemistry      Component Value Date/Time   NA 143 10/23/2016 1025   K 4.7 10/23/2016 1025   CL 103 01/03/2014 1030   CO2 27 10/23/2016 1025   BUN 14.1 10/23/2016 1025   CREATININE 0.8 10/23/2016 1025      Component Value Date/Time   CALCIUM 10.4 10/23/2016 1025   ALKPHOS 121 10/23/2016 1025   AST 23 10/23/2016 1025   ALT 25 10/23/2016 1025   BILITOT 0.81 10/23/2016 1025       No results found for: LABCA2  No components found for: LABCA125  No results for input(s): INR in the last 168 hours.  Urinalysis    Component Value Date/Time   COLORURINE YELLOW 01/03/2014 [place] 01/03/2014 1042   LABSPEC 1.012 01/03/2014 1042   PHURINE 6.0 01/03/2014 1042   GLUCOSEU NEGATIVE 01/03/2014 1042   HGBUR NEGATIVE 01/03/2014 1042   BILIRUBINUR NEGATIVE 01/03/2014 1042   KETONESUR NEGATIVE 01/03/2014 1042   PROTEINUR NEGATIVE 01/03/2014 1042   UROBILINOGEN 0.2 01/03/2014 1042   NITRITE  NEGATIVE 01/03/2014 1042   LEUKOCYTESUR TRACE (A) 01/03/2014 1042    STUDIES: Bilateral diagnostic mammography with tomography at the [facility] 10/08/2016 found a breast density to be category B. There was no evidence of malignancy.  ASSESSMENT: 71 y.o. [place] woman status post left breast upper inner quadrant biopsy 08/31/2014 for a clinical T1b N0, stage IA invasive ductal  carcinoma, grade 1, strongly estrogen and progesterone receptor positive, HER-2 not amplified, with an MIB-1 of 10%  (1) MRI biopsy of 2 additional lesions showed  (a) in the left central breast, atypical lobular hyperplasia and atypical ductal hyperplasia  (b) in the right breast lower outer quadrant a fibroadenoma   (2) dual lumpectomy 10/18/2014 showed  (a) labeled "left upper inner quadrant", a pT1b pN0, stage IA invasive ductal carcinoma, grade 1, and again HER-2 negative  (b) labeled "left central", ductal carcinoma in situ, low-grade, with a positive lateral and other close margins   (3) additional surgery for margin clearance 11/27/2014 was successful, although the lateral margin remained close at 1 mm  (4) Oncotype DX score of 9 predicts an outside the breast risk of recurrence within 10 years of 6% if the patient's only systemic treatment is tamoxifen for 5 years. It also predicts no benefit from chemotherapy.   (5)  adjuvant radiation 01/10/2015 through 02/21/2015:   Left breast 5000 cGy 25 sessions, left breast boost 1000 cGy 5 sessions    (6) anastrozole started 03/23/2015  (a) bone density through [facility] was normal 2016  (b) anastrozole held 10/30/2016 secondary to arthralgias/myalgias   (c) tamoxifen started 12/20/2016  PLAN: I spent approximately 30 minutes with Melissa Holloway with most of that time spent discussing her joint problems. Some of them are simple arthritis, and that is chiefly what's going on with her knees. Problem they  are of course is an imbalance between the muscles of the front and back of the thigh causing the patella to go out of the groove and then damaging the cartilage. I suggest that she check with either physical therapy or a gym trainer to see how she can assess what the problem may be in her case and might be able to avoid surgery that way.  The bigger concern from my point of view is her arthralgias and myalgias which are diffuse and of course this could be due to her Lipitor but she tells me she was on Lipitor for years before having this problem which really did develop after she started the anastrozole.  Accordingly I think the anastrozole may well be the reason for her feeling as all these aches and pains not just in the knees but very generally.  The way to find out if is to stop the anastrozole which we are doing today. She is going to call us in about 6 weeks, specifically on 12/16/2016, to tell me whether she is better or the same. If she has the same she will go back on anastrozole. If she is better however I will call exemestane for her and in that case I will see her 3 months from that point to assess her tolerance.  Otherwise I will see her again in one year  She knows to call for any problems that may develop before the next visit.   Chauncey Cruel, MD   04/02/2017 3:48 PM Medical Oncology and Hematology [facility] [address] [place], [place] [address] Tel. [phone]    Fax. [phone]

## 2017-04-16 ENCOUNTER — Other Ambulatory Visit: Payer: Self-pay | Admitting: Oncology

## 2017-05-18 DIAGNOSIS — R05 Cough: Secondary | ICD-10-CM | POA: Diagnosis not present

## 2017-05-18 DIAGNOSIS — E7800 Pure hypercholesterolemia, unspecified: Secondary | ICD-10-CM | POA: Diagnosis not present

## 2017-05-18 DIAGNOSIS — M1710 Unilateral primary osteoarthritis, unspecified knee: Secondary | ICD-10-CM | POA: Diagnosis not present

## 2017-08-17 ENCOUNTER — Other Ambulatory Visit: Payer: Self-pay | Admitting: Oncology

## 2017-08-17 DIAGNOSIS — Z23 Encounter for immunization: Secondary | ICD-10-CM | POA: Diagnosis not present

## 2017-08-25 ENCOUNTER — Other Ambulatory Visit: Payer: Self-pay | Admitting: Oncology

## 2017-08-25 DIAGNOSIS — Z9889 Other specified postprocedural states: Secondary | ICD-10-CM

## 2017-08-28 DIAGNOSIS — M1711 Unilateral primary osteoarthritis, right knee: Secondary | ICD-10-CM | POA: Diagnosis not present

## 2017-09-24 NOTE — Progress Notes (Signed)
Please place orders in Epic as patient is being scheduled for a pre-op appointment! Thank you! 

## 2017-09-25 DIAGNOSIS — Z01818 Encounter for other preprocedural examination: Secondary | ICD-10-CM | POA: Diagnosis not present

## 2017-09-25 DIAGNOSIS — M1710 Unilateral primary osteoarthritis, unspecified knee: Secondary | ICD-10-CM | POA: Diagnosis not present

## 2017-09-25 DIAGNOSIS — E7800 Pure hypercholesterolemia, unspecified: Secondary | ICD-10-CM | POA: Diagnosis not present

## 2017-09-25 DIAGNOSIS — E559 Vitamin D deficiency, unspecified: Secondary | ICD-10-CM | POA: Diagnosis not present

## 2017-09-27 ENCOUNTER — Ambulatory Visit: Payer: Self-pay | Admitting: Orthopedic Surgery

## 2017-09-29 DIAGNOSIS — L570 Actinic keratosis: Secondary | ICD-10-CM | POA: Diagnosis not present

## 2017-09-29 DIAGNOSIS — L821 Other seborrheic keratosis: Secondary | ICD-10-CM | POA: Diagnosis not present

## 2017-09-29 DIAGNOSIS — Z419 Encounter for procedure for purposes other than remedying health state, unspecified: Secondary | ICD-10-CM | POA: Diagnosis not present

## 2017-10-09 ENCOUNTER — Ambulatory Visit
Admission: RE | Admit: 2017-10-09 | Discharge: 2017-10-09 | Disposition: A | Discharge status: Home / Self Care | Payer: Medicare Other | Source: Ambulatory Visit | Attending: Oncology | Admitting: Oncology

## 2017-10-09 DIAGNOSIS — R928 Other abnormal and inconclusive findings on diagnostic imaging of breast: Secondary | ICD-10-CM | POA: Diagnosis not present

## 2017-10-09 DIAGNOSIS — Z9889 Other specified postprocedural states: Secondary | ICD-10-CM

## 2017-10-09 HISTORY — DX: Personal history of irradiation: Z92.3

## 2017-10-22 ENCOUNTER — Other Ambulatory Visit: Payer: Medicare Other

## 2017-10-29 ENCOUNTER — Ambulatory Visit: Payer: Medicare Other | Admitting: Oncology

## 2017-10-29 ENCOUNTER — Other Ambulatory Visit (HOSPITAL_COMMUNITY): Payer: Self-pay | Admitting: *Deleted

## 2017-10-29 NOTE — Progress Notes (Signed)
EKG 09-25-17 DR Dannial Monarch ON CHART MEDICAL CLEARANCE FOR 11-09-17 SURGERY DR Stephanie Acre ON CHART LOV DR Stephanie Acre 09-25-17 ON CHART

## 2017-10-29 NOTE — Patient Instructions (Addendum)
Melissa Holloway  10/29/2017   Your procedure is scheduled on: Monday 11-09-17  Report to [facility]  Entrance    Report to admitting at 6:00 AM   Call this number if you have problems the morning of surgery [phone]   Remember: Do not eat food or drink liquids After Midnight.    _______________________________________________________________________     Take these medicines the morning of surgery with A SIP OF WATER: ATORVASTATIN (LIPITOR), TAMOXIFEN), SYSTANE EYE DROP                                You may not have any metal on your body including hair pins and              piercings  Do not wear jewelry, make-up, lotions, powders or perfumes, deodorant             Do not wear nail polish.  Do not shave  48 hours prior to surgery.              Men may shave face and neck.   Do not bring valuables to the hospital. [place].  Contacts, dentures or bridgework may not be worn into surgery.  Leave suitcase in the car. After surgery it may be brought to your room.         Please read over the following fact sheets you were given: _____________________________________________________________________    Incentive Spirometer  An incentive spirometer is a tool that can help keep your lungs clear and active. This tool measures how well you are filling your lungs with each breath. Taking long deep breaths may help reverse or decrease the chance of developing breathing (pulmonary) problems (especially infection) following:  A long period of time when you are unable to move or be active. BEFORE THE PROCEDURE   If the spirometer includes an indicator to show your best effort, your nurse or respiratory therapist will set it to a desired goal.  If possible, sit up straight or lean slightly forward. Try not to slouch.  Hold the incentive spirometer in an upright position. INSTRUCTIONS FOR USE  1. Sit on the edge of your bed  if possible, or sit up as far as you can in bed or on a chair. 2. Hold the incentive spirometer in an upright position. 3. Breathe out normally. 4. Place the mouthpiece in your mouth and seal your lips tightly around it. 5. Breathe in slowly and as deeply as possible, raising the piston or the ball toward the top of the column. 6. Hold your breath for 3-5 seconds or for as long as possible. Allow the piston or ball to fall to the bottom of the column. 7. Remove the mouthpiece from your mouth and breathe out normally. 8. Rest for a few seconds and repeat Steps 1 through 7 at least 10 times every 1-2 hours when you are awake. Take your time and take a few normal breaths between deep breaths. 9. The spirometer may include an indicator to show your best effort. Use the indicator as a goal to work toward during each repetition. 10. After each set of 10 deep breaths, practice coughing to be sure your lungs are clear. If you have an incision (the cut made at the time of surgery), support your incision when  coughing by placing a pillow or rolled up towels firmly against it. Once you are able to get out of bed, walk around indoors and cough well. You may stop using the incentive spirometer when instructed by your caregiver.  RISKS AND COMPLICATIONS  Take your time so you do not get dizzy or light-headed.  If you are in pain, you may need to take or ask for pain medication before doing incentive spirometry. It is harder to take a deep breath if you are having pain. AFTER USE  Rest and breathe slowly and easily.  It can be helpful to keep track of a log of your progress. Your caregiver can provide you with a simple table to help with this. If you are using the spirometer at home, follow these instructions: [place] IF:   You are having difficultly using the spirometer.  You have trouble using the spirometer as often as instructed.  Your pain medication is not giving enough relief while  using the spirometer.  You develop fever of 100.5 F (38.1 C) or higher. SEEK IMMEDIATE MEDICAL CARE IF:   You cough up bloody sputum that had not been present before.  You develop fever of 102 F (38.9 C) or greater.  You develop worsening pain at or near the incision site. MAKE SURE YOU:   Understand these instructions.  Will watch your condition.  Will get help right away if you are not doing well or get worse. Document Released: 12/01/2006 Document Revised: 10/13/2011 Document Reviewed: 02/01/2007 ExitCare Patient Information 2014 ExitCare, [place].   ________________________________________________________________________  WHAT IS A BLOOD TRANSFUSION? Blood Transfusion Information  A transfusion is the replacement of blood or some of its parts. Blood is made up of multiple cells which provide different functions.  Red blood cells carry oxygen and are used for blood loss replacement.  White blood cells fight against infection.  Platelets control bleeding.  Plasma helps clot blood.  Other blood products are available for specialized needs, such as hemophilia or other clotting disorders. BEFORE THE TRANSFUSION  Who gives blood for transfusions?   Healthy volunteers who are fully evaluated to make sure their blood is safe. This is blood bank blood. Transfusion therapy is the safest it has ever been in the practice of medicine. Before blood is taken from a donor, a complete history is taken to make sure that person has no history of diseases nor engages in risky social behavior (examples are intravenous drug use or sexual activity with multiple partners). The donor's travel history is screened to minimize risk of transmitting infections, such as malaria. The donated blood is tested for signs of infectious diseases, such as HIV and hepatitis. The blood is then tested to be sure it is compatible with you in order to minimize the chance of a transfusion reaction. If you or a  relative donates blood, this is often done in anticipation of surgery and is not appropriate for emergency situations. It takes many days to process the donated blood. RISKS AND COMPLICATIONS Although transfusion therapy is very safe and saves many lives, the main dangers of transfusion include:   Getting an infectious disease.  Developing a transfusion reaction. This is an allergic reaction to something in the blood you were given. Every precaution is taken to prevent this. The decision to have a blood transfusion has been considered carefully by your caregiver before blood is given. Blood is not given unless the benefits outweigh the risks. AFTER THE TRANSFUSION  Right after receiving  a blood transfusion, you will usually feel much better and more energetic. This is especially true if your red blood cells have gotten low (anemic). The transfusion raises the level of the red blood cells which carry oxygen, and this usually causes an energy increase.  The nurse administering the transfusion will monitor you carefully for complications. HOME CARE INSTRUCTIONS  No special instructions are needed after a transfusion. You may find your energy is better. Speak with your caregiver about any limitations on activity for underlying diseases you may have. SEEK MEDICAL CARE IF:   Your condition is not improving after your transfusion.  You develop redness or irritation at the intravenous (IV) site. SEEK IMMEDIATE MEDICAL CARE IF:  Any of the following symptoms occur over the next 12 hours:  Shaking chills.  You have a temperature by mouth above 102 F (38.9 C), not controlled by medicine.  Chest, back, or muscle pain.  People around you feel you are not acting correctly or are confused.  Shortness of breath or difficulty breathing.  Dizziness and fainting.  You get a rash or develop hives.  You have a decrease in urine output.  Your urine turns a dark color or changes to pink, red, or  brown. Any of the following symptoms occur over the next 10 days:  You have a temperature by mouth above 102 F (38.9 C), not controlled by medicine.  Shortness of breath.  Weakness after normal activity.  The white part of the eye turns yellow (jaundice).  You have a decrease in the amount of urine or are urinating less often.  Your urine turns a dark color or changes to pink, red, or brown. Document Released: 07/18/2000 Document Revised: 10/13/2011 Document Reviewed: 03/06/2008 ExitCare Patient Information 2014 [place].  _______________________________________________________________________   [facility] - Preparing for Surgery Before surgery, you can play an important role.  Because skin is not sterile, your skin needs to be as free of germs as possible.  You can reduce the number of germs on your skin by washing with CHG (chlorahexidine gluconate) soap before surgery.  CHG is an antiseptic cleaner which kills germs and bonds with the skin to continue killing germs even after washing. Please DO NOT use if you have an allergy to CHG or antibacterial soaps.  If your skin becomes reddened/irritated stop using the CHG and inform your nurse when you arrive at Short Stay. Do not shave (including legs and underarms) for at least 48 hours prior to the first CHG shower.  You may shave your face/neck. Please follow these instructions carefully:  1.  Shower with CHG Soap the night before surgery and the  morning of Surgery.  2.  If you choose to wash your hair, wash your hair first as usual with your  normal  shampoo.  3.  After you shampoo, rinse your hair and body thoroughly to remove the  shampoo.                           4.  Use CHG as you would any other liquid soap.  You can apply chg directly  to the skin and wash                       Gently with a scrungie or clean washcloth.  5.  Apply the CHG Soap to your body ONLY FROM THE NECK DOWN.   Do not use on face/ open  Wound or open sores. Avoid contact with eyes, ears mouth and genitals (private parts).                       Wash face,  Genitals (private parts) with your normal soap.             6.  Wash thoroughly, paying special attention to the area where your surgery  will be performed.  7.  Thoroughly rinse your body with warm water from the neck down.  8.  DO NOT shower/wash with your normal soap after using and rinsing off  the CHG Soap.                9.  Pat yourself dry with a clean towel.            10.  Wear clean pajamas.            11.  Place clean sheets on your bed the night of your first shower and do not  sleep with pets. Day of Surgery : Do not apply any lotions/deodorants the morning of surgery.  Please wear clean clothes to the hospital/surgery center.  FAILURE TO FOLLOW THESE INSTRUCTIONS MAY RESULT IN THE CANCELLATION OF YOUR SURGERY PATIENT SIGNATURE_________________________________  NURSE SIGNATURE__________________________________  ________________________________________________________________________

## 2017-11-02 ENCOUNTER — Encounter (HOSPITAL_COMMUNITY): Payer: Self-pay

## 2017-11-02 ENCOUNTER — Encounter (HOSPITAL_COMMUNITY)
Admission: RE | Admit: 2017-11-02 | Discharge: 2017-11-02 | Disposition: A | Discharge status: Home / Self Care | Payer: Medicare Other | Source: Ambulatory Visit | Attending: Orthopedic Surgery | Admitting: Orthopedic Surgery

## 2017-11-02 ENCOUNTER — Other Ambulatory Visit: Payer: Self-pay

## 2017-11-02 DIAGNOSIS — M1711 Unilateral primary osteoarthritis, right knee: Secondary | ICD-10-CM | POA: Diagnosis not present

## 2017-11-02 DIAGNOSIS — Z01812 Encounter for preprocedural laboratory examination: Secondary | ICD-10-CM | POA: Diagnosis present

## 2017-11-02 LAB — COMPREHENSIVE METABOLIC PANEL
ALK PHOS: 53 U/L (ref 38–126)
ALT: 23 U/L (ref 14–54)
ANION GAP: 8 (ref 5–15)
AST: 27 U/L (ref 15–41)
Albumin: 4.1 g/dL (ref 3.5–5.0)
BILIRUBIN TOTAL: 0.7 mg/dL (ref 0.3–1.2)
BUN: 15 mg/dL (ref 6–20)
CALCIUM: 9.8 mg/dL (ref 8.9–10.3)
CO2: 27 mmol/L (ref 22–32)
Chloride: 107 mmol/L (ref 101–111)
Creatinine, Ser: 0.79 mg/dL (ref 0.44–1.00)
GFR calc Af Amer: >60 mL/min (ref >60)
GFR calc non Af Amer: >60 mL/min (ref >60)
Glucose, Bld: 108 mg/dL — ABNORMAL HIGH (ref 65–99)
POTASSIUM: 5.1 mmol/L (ref 3.5–5.1)
Sodium: 142 mmol/L (ref 135–145)
TOTAL PROTEIN: 7 g/dL (ref 6.5–8.1)

## 2017-11-02 LAB — CBC
HEMATOCRIT: 44.8 % (ref 36.0–46.0)
HEMOGLOBIN: 14.1 g/dL (ref 12.0–15.0)
MCH: 28.6 pg (ref 26.0–34.0)
MCHC: 31.5 g/dL (ref 30.0–36.0)
MCV: 90.9 fL (ref 78.0–100.0)
Platelets: 261 10*3/uL (ref 150–400)
RBC: 4.93 MIL/uL (ref 3.87–5.11)
RDW: 14 % (ref 11.5–15.5)
WBC: 4.3 10*3/uL (ref 4.0–10.5)

## 2017-11-02 LAB — PROTIME-INR
INR: 1.11
Prothrombin Time: 14.2 seconds (ref 11.4–15.2)

## 2017-11-02 LAB — APTT: aPTT: 27 seconds (ref 24–36)

## 2017-11-02 LAB — SURGICAL PCR SCREEN
MRSA, PCR: NEGATIVE
Staphylococcus aureus: NEGATIVE

## 2017-11-02 LAB — ABO/RH: ABO/RH(D): O POS

## 2017-11-08 ENCOUNTER — Ambulatory Visit: Payer: Self-pay | Admitting: Orthopedic Surgery

## 2017-11-08 MED ORDER — BUPIVACAINE LIPOSOME 1.3 % IJ SUSP
20.0000 mL | Freq: Once | INTRAMUSCULAR | Status: DC
  Filled 2017-11-08: qty 20

## 2017-11-08 NOTE — H&P (View-Only) (Signed)
Melissa Holloway, Melissa Holloway (71yo, F)  DOB Aug 25, 1945   Chief Complaint Right Knee Pain H&P right TKA 11-09-17  Patient's Care Team Primary Care Provider: Lilian Coma MD: [address], [place], Melissa Holloway, Ph [phone], Fax [phone] NPI: [id number] Patient's Pharmacies [place] [address] [facility]): [address] [place] [address], Ph [phone], Fax [phone]   Vitals Ht: 5 ft 3 in  Wt: 190 lbs  BMI: 33.7  BP: 128/70 sitting R arm  Pulse: 76 bpm  AVOID LEFT ARM FOR IV'S AND BP'S    Allergies Reviewed Allergies NKDA   Medications Reviewed Medications Adult Low Dose Aspirin 81 mg tablet,delayed release 08/28/17   entered Melissa Holloway Advil prn 08/28/17   entered Melissa Holloway atorvastatin 20 mg tablet 08/17/17   filled PRIME tamoxifen 20 mg tablet 08/18/17   filled PRIME Vitamin D 11/08/17   entered Melissa Basso, PA-C            Problems Reviewed Problems Osteoarthritis of right knee joint  Total replacement of left knee joint    Family History Reviewed Family History Father - Father deceased Mother - Mother deceased Brother - Aneurysm (died age: 37)   - Aneurysm   - Myocardial infarction (died age: 33)   - Myocardial infarction Sister - Aneurysm (died age: 97)   - Aneurysm (died age: 21)   - Myocardial infarction   Social History Reviewed Social History Smoking Status: Never smoker Chewing tobacco: none Alcohol intake: None Hand Dominance: Right Work related injury?: N Advance directive: Y (Notes: Living Will) Medical Power of Attorney: Y   Surgical History Reviewed Surgical History Colonoscopy - 08/04/2016 Biopsy of breast - 08/04/2014 Knee Joint Replacement - 08/04/2014 - left, by Dr. Mayer Camel Other - 08/04/2008 - esophogeal hernia repair   Past Medical History Reviewed Past Medical History Cancer: Y - Breast Cancer - Left Side GERD/Reflux: Y High Cholesterol: Y Joint Pain: Y Previous  Cortisone Injection(s): Y Notes: Shingles,  Hemorrhoids,  Childhood Measels,  Childhood Mumps,  Vitamin D Deficiency    HPI The patient is here today for a pre-operative History and Physical. They are scheduled for right total knee replacement on 11-09-2017 with Dr. Wynelle Link at [facility]. [name] is seen for ongoing right knee pain. She reports that the right knee has been bothering her for about two years, with no known injury. She reports medial sided pain and stiffness, with occasional swelling. She states that her knee feels like it may give out at times. She reports the most difficulty with extension. She did have a cortisone injection in the right knee in July, by her primary care physician. She states that she did get some relief with the injection. She is taking Advil, as needed for pain. She has several different braces that she has tried, but reports that they really do not seem to help. She did have a left total knee replacement in 2015, by Dr. Mayer Camel. Her knee has gotten progressively worse over the past year or 2. The injections do provide some short-term benefit for a few weeks but nothing long-term. She is at a stage with this knee and is as bad or worse than the LEFT knee was when she had that replaced. She is very pleased with how her LEFT knee has done. She is ready to proceed with surgery on the right knee at this time. Risks and benefits have been discussed with the patient and she elects to proceed with surgery  at this time.   ROS Constitutional: Constitutional: no fever, chills, night sweats, or significant weight loss.  Cardiovascular: Cardiovascular: no palpitations or chest pain.  Respiratory: Respiratory: no shortness of breath, cough, and No COPD.  Gastrointestinal: Gastrointestinal: no vomiting or nausea.  Musculoskeletal: Musculoskeletal: Joint Pain and swelling in Joints.  Neurologic: Neurologic: no numbness, tingling, or difficulty with  balance.   Physical Exam Patient is a 72 year old female.  General Mental Status - Alert, cooperative and good historian. General Appearance - pleasant, Not in acute distress. Orientation - Oriented X3. Build & Nutrition - Well nourished and Well developed.  Head and Neck Head - normocephalic, atraumatic . Neck Global Assessment - supple, no bruit auscultated on the right, no bruit auscultated on the left.  Eye Pupil - Bilateral - PERR Motion - Bilateral - EOMI.  Chest and Lung Exam Auscultation Breath sounds - clear at anterior chest wall and clear at posterior chest wall. Adventitious sounds - No Adventitious sounds.  Cardiovascular Auscultation Rhythm - Regular rate and rhythm. Heart Sounds - S1 WNL and S2 WNL. Murmurs & Other Heart Sounds - Auscultation of the heart reveals - No Murmurs.  Abdomen Palpation/Percussion Tenderness - Abdomen is non-tender to palpation. Abdomen is soft. Auscultation Auscultation of the abdomen reveals - Bowel sounds normal.  Genitourinary Note: Not done, not pertinent to present illness  Musculoskeletal Examination of the right hip shows flexion to 120 rotation in 30 abduction 40 and external rotation of 40. There is no tenderness over the greater trochanter. There is no pain on provocative testing of the hip. Evaluation of the left hip shows flexion to 120 rotation in 30 out 40 and abduction 40 without discomfort. There is no tenderness over the greater trochanter. There is no pain on provocative testing of the hip. Her LEFT knee shows no swelling. Her range of motion is 0-125 no tenderness or instability. Her RIGHT knee shows no effusion. Her range is 5-120. There is moderate crepitus on range of motion with tenderness medial greater than lateral and no instability noted. Pulses, sensation and motor are intact. She has an antalgic gait pattern on the RIGHT.  Radiographs-AP and lateral both knees show the prosthesis on the LEFT in  excellent position with no periprosthetic abnormalities. On the RIGHT she has bone-on-bone arthritis in the medial and patellofemoral compartments.   Assessment / Plan 1. Osteoarthritis of right knee joint M17.11: Unilateral primary osteoarthritis, right knee  Patient Instructions Surgical Plans: Right Total Knee Replacement Disposition: Home with Husband, Straight to Outpatient at [facility] at [facility] PCP: Dr. Stephanie Acre - seen and cleared to proceed with surgery Topical TXA - Breast Cancer - AVOID LEFT ARM FOR IV'S AND BP'S Anesthesia Issues: Sick one time following previous knee surgery Patient was instructed on what medications to stop prior to surgery. - Follow up visit in 2 weeks with Dr. Wynelle Link - Begin physical therapy following surgery - PHYSICAL THERAPY REFERRAL Note to Provider to setup first PT EVAL on Friday 11/13/2017 - Pre-operative lab work as pre Pre-Surgical Testing - Prescriptions will be provided in hospital at time of discharge  Anticipated LOS equal to or greater than 2 midnights due to - Age 3 and older with one or more of the following:  - Obesity  - Expected need for hospital services (PT, OT, Nursing) required for safe  discharge  - Anticipated need for postoperative skilled nursing care or inpatient rehab  - Active co-morbidities: None  Return to Office PATRICIA POFF, DPT for 5-Physical  Therapy Evaluation at [facility] on 11/13/2017 at 09:30 AM Gaynelle Arabian, MD for 5-Post-Op at [facility] on 11/24/2017 at 02:00 PM  Encounter Sign-Off Encounter signed-off by Mickel Crow, PA-C

## 2017-11-08 NOTE — H&P (Signed)
Melissa Holloway, Melissa Holloway (71yo, F)  DOB 1946-05-10   Chief Complaint Right Knee Pain H&P right TKA 11-09-17  Patient's Care Team Primary Care Provider: Lilian Coma MD: [address], [place], [place] [address], Ph [phone], Fax [phone] NPI: [id number] Patient's Pharmacies [place] [address] [facility]): [address] [place] [address], Ph [phone], Fax [phone]) [phone]   Vitals Ht: 5 ft 3 in  Wt: 190 lbs  BMI: 33.7  BP: 128/70 sitting R arm  Pulse: 76 bpm  AVOID LEFT ARM FOR IV'S AND BP'S    Allergies Reviewed Allergies NKDA   Medications Reviewed Medications Adult Low Dose Aspirin 81 mg tablet,delayed release 08/28/17   entered Faith Brown Advil prn 08/28/17   entered Faith Brown atorvastatin 20 mg tablet 08/17/17   filled PRIME tamoxifen 20 mg tablet 08/18/17   filled PRIME Vitamin D 11/08/17   entered Oakley Kossman, PA-C            Problems Reviewed Problems Osteoarthritis of right knee joint  Total replacement of left knee joint    Family History Reviewed Family History Father - Father deceased Mother - Mother deceased Brother - Aneurysm (died age: 63)   - Aneurysm   - Myocardial infarction (died age: 70)   - Myocardial infarction Sister - Aneurysm (died age: 41)   - Aneurysm (died age: 32)   - Myocardial infarction   Social History Reviewed Social History Smoking Status: Never smoker Chewing tobacco: none Alcohol intake: None Hand Dominance: Right Work related injury?: N Advance directive: Y (Notes: Living Will) Medical Power of Attorney: Y   Surgical History Reviewed Surgical History Colonoscopy - 08/04/2016 Biopsy of breast - 08/04/2014 Knee Joint Replacement - 08/04/2014 - left, by Dr. Mayer Camel Other - 08/04/2008 - esophogeal hernia repair   Past Medical History Reviewed Past Medical History Cancer: Y - Breast Cancer - Left Side GERD/Reflux: Y High Cholesterol: Y Joint Pain: Y Previous  Cortisone Injection(s): Y Notes: Shingles,  Hemorrhoids,  Childhood Measels,  Childhood Mumps,  Vitamin D Deficiency    HPI The patient is here today for a pre-operative History and Physical. They are scheduled for right total knee replacement on 11-09-2017 with Dr. Wynelle Link at [facility]. Melissa Holloway is seen for ongoing right knee pain. She reports that the right knee has been bothering her for about two years, with no known injury. She reports medial sided pain and stiffness, with occasional swelling. She states that her knee feels like it may give out at times. She reports the most difficulty with extension. She did have a cortisone injection in the right knee in July, by her primary care physician. She states that she did get some relief with the injection. She is taking Advil, as needed for pain. She has several different braces that she has tried, but reports that they really do not seem to help. She did have a left total knee replacement in 2015, by Dr. Mayer Camel. Her knee has gotten progressively worse over the past year or 2. The injections do provide some short-term benefit for a few weeks but nothing long-term. She is at a stage with this knee and is as bad or worse than the LEFT knee was when she had that replaced. She is very pleased with how her LEFT knee has done. She is ready to proceed with surgery on the right knee at this time. Risks and benefits have been discussed with the patient and she elects to proceed with surgery  at this time.   ROS Constitutional: Constitutional: no fever, chills, night sweats, or significant weight loss.  Cardiovascular: Cardiovascular: no palpitations or chest pain.  Respiratory: Respiratory: no shortness of breath, cough, and No COPD.  Gastrointestinal: Gastrointestinal: no vomiting or nausea.  Musculoskeletal: Musculoskeletal: Joint Pain and swelling in Joints.  Neurologic: Neurologic: no numbness, tingling, or difficulty with  balance.   Physical Exam Patient is a 72 year old female.  General Mental Status - Alert, cooperative and good historian. General Appearance - pleasant, Not in acute distress. Orientation - Oriented X3. Build & Nutrition - Well nourished and Well developed.  Head and Neck Head - normocephalic, atraumatic . Neck Global Assessment - supple, no bruit auscultated on the right, no bruit auscultated on the left.  Eye Pupil - Bilateral - PERR Motion - Bilateral - EOMI.  Chest and Lung Exam Auscultation Breath sounds - clear at anterior chest wall and clear at posterior chest wall. Adventitious sounds - No Adventitious sounds.  Cardiovascular Auscultation Rhythm - Regular rate and rhythm. Heart Sounds - S1 WNL and S2 WNL. Murmurs & Other Heart Sounds - Auscultation of the heart reveals - No Murmurs.  Abdomen Palpation/Percussion Tenderness - Abdomen is non-tender to palpation. Abdomen is soft. Auscultation Auscultation of the abdomen reveals - Bowel sounds normal.  Genitourinary Note: Not done, not pertinent to present illness  Musculoskeletal Examination of the right hip shows flexion to 120 rotation in 30 abduction 40 and external rotation of 40. There is no tenderness over the greater trochanter. There is no pain on provocative testing of the hip. Evaluation of the left hip shows flexion to 120 rotation in 30 out 40 and abduction 40 without discomfort. There is no tenderness over the greater trochanter. There is no pain on provocative testing of the hip. Her LEFT knee shows no swelling. Her range of motion is 0-125 no tenderness or instability. Her RIGHT knee shows no effusion. Her range is 5-120. There is moderate crepitus on range of motion with tenderness medial greater than lateral and no instability noted. Pulses, sensation and motor are intact. She has an antalgic gait pattern on the RIGHT.  Radiographs-AP and lateral both knees show the prosthesis on the LEFT in  excellent position with no periprosthetic abnormalities. On the RIGHT she has bone-on-bone arthritis in the medial and patellofemoral compartments.   Assessment / Plan 1. Osteoarthritis of right knee joint M17.11: Unilateral primary osteoarthritis, right knee  Patient Instructions Surgical Plans: Right Total Knee Replacement Disposition: Home with Husband, Straight to Outpatient at [facility] PCP: Dr. Stephanie Acre - seen and cleared to proceed with surgery Topical TXA - Breast Cancer - AVOID LEFT ARM FOR IV'S AND BP'S Anesthesia Issues: Sick one time following previous knee surgery Patient was instructed on what medications to stop prior to surgery. - Follow up visit in 2 weeks with Dr. Wynelle Link - Begin physical therapy following surgery - PHYSICAL THERAPY REFERRAL Note to Provider to setup first PT EVAL on Friday 11/13/2017 - Pre-operative lab work as pre Pre-Surgical Testing - Prescriptions will be provided in hospital at time of discharge  Anticipated LOS equal to or greater than 2 midnights due to - Age 22 and older with one or more of the following:  - Obesity  - Expected need for hospital services (PT, OT, Nursing) required for safe  discharge  - Anticipated need for postoperative skilled nursing care or inpatient rehab  - Active co-morbidities: None  Return to Office Melissa Holloway, DPT for 5-Physical  Therapy Evaluation at [facility] on 11/13/2017 at 09:30 AM Gaynelle Arabian, MD for 5-Post-Op at [facility] on 11/24/2017 at 02:00 PM  Encounter Sign-Off Encounter signed-off by Mickel Crow, PA-C

## 2017-11-09 ENCOUNTER — Other Ambulatory Visit: Payer: Self-pay

## 2017-11-09 ENCOUNTER — Encounter (HOSPITAL_COMMUNITY): Payer: Self-pay | Admitting: Registered Nurse

## 2017-11-09 ENCOUNTER — Inpatient Hospital Stay (HOSPITAL_COMMUNITY): Payer: Medicare Other | Admitting: Registered Nurse

## 2017-11-09 ENCOUNTER — Encounter (HOSPITAL_COMMUNITY)
Admission: RE | Disposition: A | Discharge status: Home / Self Care | Payer: Self-pay | Source: Ambulatory Visit | Attending: Orthopedic Surgery

## 2017-11-09 ENCOUNTER — Inpatient Hospital Stay (HOSPITAL_COMMUNITY)
Admission: RE | Admit: 2017-11-09 | Discharge: 2017-11-10 | DRG: 470 | Disposition: A | Discharge status: Home / Self Care | Payer: Medicare Other | Source: Ambulatory Visit | Attending: Orthopedic Surgery | Admitting: Orthopedic Surgery

## 2017-11-09 DIAGNOSIS — Z7982 Long term (current) use of aspirin: Secondary | ICD-10-CM | POA: Diagnosis not present

## 2017-11-09 DIAGNOSIS — E559 Vitamin D deficiency, unspecified: Secondary | ICD-10-CM | POA: Diagnosis not present

## 2017-11-09 DIAGNOSIS — Z853 Personal history of malignant neoplasm of breast: Secondary | ICD-10-CM | POA: Diagnosis not present

## 2017-11-09 DIAGNOSIS — Z79899 Other long term (current) drug therapy: Secondary | ICD-10-CM

## 2017-11-09 DIAGNOSIS — Z6833 Body mass index (BMI) 33.0-33.9, adult: Secondary | ICD-10-CM | POA: Diagnosis not present

## 2017-11-09 DIAGNOSIS — M25561 Pain in right knee: Secondary | ICD-10-CM | POA: Diagnosis present

## 2017-11-09 DIAGNOSIS — M1710 Unilateral primary osteoarthritis, unspecified knee: Secondary | ICD-10-CM | POA: Diagnosis present

## 2017-11-09 DIAGNOSIS — M179 Osteoarthritis of knee, unspecified: Secondary | ICD-10-CM | POA: Diagnosis present

## 2017-11-09 DIAGNOSIS — M1711 Unilateral primary osteoarthritis, right knee: Secondary | ICD-10-CM

## 2017-11-09 DIAGNOSIS — K219 Gastro-esophageal reflux disease without esophagitis: Secondary | ICD-10-CM | POA: Diagnosis present

## 2017-11-09 DIAGNOSIS — E669 Obesity, unspecified: Secondary | ICD-10-CM | POA: Diagnosis not present

## 2017-11-09 DIAGNOSIS — Z96652 Presence of left artificial knee joint: Secondary | ICD-10-CM | POA: Diagnosis not present

## 2017-11-09 DIAGNOSIS — M25761 Osteophyte, right knee: Secondary | ICD-10-CM | POA: Diagnosis not present

## 2017-11-09 DIAGNOSIS — G8918 Other acute postprocedural pain: Secondary | ICD-10-CM | POA: Diagnosis not present

## 2017-11-09 DIAGNOSIS — E7800 Pure hypercholesterolemia, unspecified: Secondary | ICD-10-CM | POA: Diagnosis present

## 2017-11-09 DIAGNOSIS — M1712 Unilateral primary osteoarthritis, left knee: Secondary | ICD-10-CM | POA: Diagnosis present

## 2017-11-09 HISTORY — PX: TOTAL KNEE ARTHROPLASTY: SHX125

## 2017-11-09 LAB — TYPE AND SCREEN
ABO/RH(D): O POS
Antibody Screen: NEGATIVE

## 2017-11-09 SURGERY — ARTHROPLASTY, KNEE, TOTAL
Anesthesia: Spinal | Site: Knee | Laterality: Right

## 2017-11-09 MED ORDER — ACETAMINOPHEN 10 MG/ML IV SOLN
1000.0000 mg | Freq: Once | INTRAVENOUS | Status: AC
  Administered 2017-11-09: 1000 mg via INTRAVENOUS
  Filled 2017-11-09: qty 100

## 2017-11-09 MED ORDER — ONDANSETRON HCL 4 MG/2ML IJ SOLN: 4.0000 mg | Freq: Four times a day (QID) | INTRAMUSCULAR | Status: DC | PRN

## 2017-11-09 MED ORDER — CHLORHEXIDINE GLUCONATE 4 % EX LIQD: 60.0000 mL | Freq: Once | CUTANEOUS | Status: DC

## 2017-11-09 MED ORDER — LIDOCAINE 2% (20 MG/ML) 5 ML SYRINGE
INTRAMUSCULAR | Status: AC
  Filled 2017-11-09: qty 5

## 2017-11-09 MED ORDER — FENTANYL CITRATE (PF) 100 MCG/2ML IJ SOLN
50.0000 ug | INTRAMUSCULAR | Status: DC
  Administered 2017-11-09: 50 ug via INTRAVENOUS
  Filled 2017-11-09: qty 2

## 2017-11-09 MED ORDER — LACTATED RINGERS IV SOLN
INTRAVENOUS | Status: DC
  Administered 2017-11-09 (×2): via INTRAVENOUS

## 2017-11-09 MED ORDER — BISACODYL 10 MG RE SUPP: 10.0000 mg | Freq: Every day | RECTAL | Status: DC | PRN

## 2017-11-09 MED ORDER — METHOCARBAMOL 1000 MG/10ML IJ SOLN
500.0000 mg | Freq: Four times a day (QID) | INTRAMUSCULAR | Status: DC | PRN
  Administered 2017-11-09: 500 mg via INTRAVENOUS
  Filled 2017-11-09: qty 550

## 2017-11-09 MED ORDER — PROPOFOL 10 MG/ML IV BOLUS
INTRAVENOUS | Status: DC | PRN
  Administered 2017-11-09: 20 mg via INTRAVENOUS

## 2017-11-09 MED ORDER — MEPERIDINE HCL 50 MG/ML IJ SOLN: 6.2500 mg | INTRAMUSCULAR | Status: DC | PRN

## 2017-11-09 MED ORDER — CEFAZOLIN SODIUM-DEXTROSE 2-4 GM/100ML-% IV SOLN
2.0000 g | INTRAVENOUS | Status: AC
  Administered 2017-11-09: 2 g via INTRAVENOUS
  Filled 2017-11-09: qty 100

## 2017-11-09 MED ORDER — PROPOFOL 500 MG/50ML IV EMUL
INTRAVENOUS | Status: DC | PRN
  Administered 2017-11-09: 50 ug/kg/min via INTRAVENOUS

## 2017-11-09 MED ORDER — TAMOXIFEN CITRATE 10 MG PO TABS
20.0000 mg | ORAL_TABLET | Freq: Every day | ORAL | Status: DC
  Administered 2017-11-09 – 2017-11-10 (×2): 20 mg via ORAL
  Filled 2017-11-09 (×2): qty 2

## 2017-11-09 MED ORDER — MENTHOL 3 MG MT LOZG: 1.0000 | LOZENGE | OROMUCOSAL | Status: DC | PRN

## 2017-11-09 MED ORDER — ACETAMINOPHEN 500 MG PO TABS
1000.0000 mg | ORAL_TABLET | Freq: Four times a day (QID) | ORAL | Status: AC
  Administered 2017-11-09 – 2017-11-10 (×4): 1000 mg via ORAL
  Filled 2017-11-09 (×4): qty 2

## 2017-11-09 MED ORDER — METOCLOPRAMIDE HCL 5 MG/ML IJ SOLN: 5.0000 mg | Freq: Three times a day (TID) | INTRAMUSCULAR | Status: DC | PRN

## 2017-11-09 MED ORDER — HYDROMORPHONE HCL 1 MG/ML IJ SOLN: 0.2500 mg | INTRAMUSCULAR | Status: DC | PRN

## 2017-11-09 MED ORDER — HYDROMORPHONE HCL 1 MG/ML IJ SOLN: 0.5000 mg | INTRAMUSCULAR | Status: DC | PRN

## 2017-11-09 MED ORDER — SODIUM CHLORIDE 0.9 % IR SOLN
Status: DC | PRN
  Administered 2017-11-09: 1000 mL

## 2017-11-09 MED ORDER — DIPHENHYDRAMINE HCL 12.5 MG/5ML PO ELIX: 12.5000 mg | ORAL_SOLUTION | ORAL | Status: DC | PRN

## 2017-11-09 MED ORDER — OXYCODONE HCL 5 MG PO TABS: 10.0000 mg | ORAL_TABLET | ORAL | Status: DC | PRN

## 2017-11-09 MED ORDER — RIVAROXABAN 10 MG PO TABS
10.0000 mg | ORAL_TABLET | Freq: Every day | ORAL | Status: DC
  Administered 2017-11-10: 10 mg via ORAL
  Filled 2017-11-09: qty 1

## 2017-11-09 MED ORDER — STERILE WATER FOR IRRIGATION IR SOLN
Status: DC | PRN
  Administered 2017-11-09: 1000 mL

## 2017-11-09 MED ORDER — MIDAZOLAM HCL 2 MG/2ML IJ SOLN
1.0000 mg | INTRAMUSCULAR | Status: DC
  Administered 2017-11-09: 2 mg via INTRAVENOUS
  Filled 2017-11-09: qty 2

## 2017-11-09 MED ORDER — PHENOL 1.4 % MT LIQD
1.0000 | OROMUCOSAL | Status: DC | PRN
  Filled 2017-11-09: qty 177

## 2017-11-09 MED ORDER — SODIUM CHLORIDE 0.9 % IJ SOLN
INTRAMUSCULAR | Status: AC
  Filled 2017-11-09: qty 50

## 2017-11-09 MED ORDER — METOCLOPRAMIDE HCL 5 MG PO TABS: 5.0000 mg | ORAL_TABLET | Freq: Three times a day (TID) | ORAL | Status: DC | PRN

## 2017-11-09 MED ORDER — DEXAMETHASONE SODIUM PHOSPHATE 10 MG/ML IJ SOLN
INTRAMUSCULAR | Status: AC
  Filled 2017-11-09: qty 1

## 2017-11-09 MED ORDER — TRANEXAMIC ACID 1000 MG/10ML IV SOLN
INTRAVENOUS | Status: AC | PRN
  Administered 2017-11-09: 2000 mg via TOPICAL

## 2017-11-09 MED ORDER — TRAMADOL HCL 50 MG PO TABS: 50.0000 mg | ORAL_TABLET | Freq: Four times a day (QID) | ORAL | Status: DC | PRN

## 2017-11-09 MED ORDER — GABAPENTIN 300 MG PO CAPS
300.0000 mg | ORAL_CAPSULE | Freq: Once | ORAL | Status: AC
  Administered 2017-11-09: 300 mg via ORAL
  Filled 2017-11-09: qty 1

## 2017-11-09 MED ORDER — DEXAMETHASONE SODIUM PHOSPHATE 10 MG/ML IJ SOLN
10.0000 mg | Freq: Once | INTRAMUSCULAR | Status: AC
  Administered 2017-11-10: 10 mg via INTRAVENOUS
  Filled 2017-11-09: qty 1

## 2017-11-09 MED ORDER — SODIUM CHLORIDE 0.9 % IV SOLN
INTRAVENOUS | Status: DC
  Administered 2017-11-09 – 2017-11-10 (×2): via INTRAVENOUS

## 2017-11-09 MED ORDER — ACETAMINOPHEN 325 MG PO TABS: 325.0000 mg | ORAL_TABLET | Freq: Four times a day (QID) | ORAL | Status: DC | PRN

## 2017-11-09 MED ORDER — ONDANSETRON HCL 4 MG/2ML IJ SOLN
INTRAMUSCULAR | Status: DC | PRN
  Administered 2017-11-09: 4 mg via INTRAVENOUS

## 2017-11-09 MED ORDER — POLYETHYLENE GLYCOL 3350 17 G PO PACK: 17.0000 g | PACK | Freq: Every day | ORAL | Status: DC | PRN

## 2017-11-09 MED ORDER — ONDANSETRON HCL 4 MG PO TABS: 4.0000 mg | ORAL_TABLET | Freq: Four times a day (QID) | ORAL | Status: DC | PRN

## 2017-11-09 MED ORDER — TRANEXAMIC ACID 1000 MG/10ML IV SOLN
2000.0000 mg | Freq: Once | INTRAVENOUS | Status: DC
  Filled 2017-11-09: qty 20

## 2017-11-09 MED ORDER — OXYCODONE HCL 5 MG PO TABS
5.0000 mg | ORAL_TABLET | ORAL | Status: DC | PRN
  Administered 2017-11-09 – 2017-11-10 (×2): 5 mg via ORAL
  Filled 2017-11-09 (×2): qty 1

## 2017-11-09 MED ORDER — ONDANSETRON HCL 4 MG/2ML IJ SOLN: 4.0000 mg | Freq: Once | INTRAMUSCULAR | Status: DC | PRN

## 2017-11-09 MED ORDER — DEXAMETHASONE SODIUM PHOSPHATE 10 MG/ML IJ SOLN
10.0000 mg | Freq: Once | INTRAMUSCULAR | Status: AC
  Administered 2017-11-09: 10 mg via INTRAVENOUS

## 2017-11-09 MED ORDER — BUPIVACAINE IN DEXTROSE 0.75-8.25 % IT SOLN
INTRATHECAL | Status: DC | PRN
  Administered 2017-11-09: 2 mL via INTRATHECAL

## 2017-11-09 MED ORDER — ROPIVACAINE HCL 7.5 MG/ML IJ SOLN
INTRAMUSCULAR | Status: DC | PRN
  Administered 2017-11-09: 20 mL via PERINEURAL

## 2017-11-09 MED ORDER — METHOCARBAMOL 500 MG PO TABS
500.0000 mg | ORAL_TABLET | Freq: Four times a day (QID) | ORAL | Status: DC | PRN
  Administered 2017-11-09 – 2017-11-10 (×2): 500 mg via ORAL
  Filled 2017-11-09 (×2): qty 1

## 2017-11-09 MED ORDER — ONDANSETRON HCL 4 MG/2ML IJ SOLN
INTRAMUSCULAR | Status: AC
  Filled 2017-11-09: qty 2

## 2017-11-09 MED ORDER — LIDOCAINE 2% (20 MG/ML) 5 ML SYRINGE
INTRAMUSCULAR | Status: DC | PRN
  Administered 2017-11-09: 40 mg via INTRAVENOUS

## 2017-11-09 MED ORDER — DOCUSATE SODIUM 100 MG PO CAPS
100.0000 mg | ORAL_CAPSULE | Freq: Two times a day (BID) | ORAL | Status: DC
  Administered 2017-11-09 – 2017-11-10 (×2): 100 mg via ORAL
  Filled 2017-11-09 (×2): qty 1

## 2017-11-09 MED ORDER — PROPOFOL 10 MG/ML IV BOLUS
INTRAVENOUS | Status: AC
  Filled 2017-11-09: qty 60

## 2017-11-09 MED ORDER — ATORVASTATIN CALCIUM 20 MG PO TABS
20.0000 mg | ORAL_TABLET | Freq: Every day | ORAL | Status: DC
  Administered 2017-11-10: 20 mg via ORAL
  Filled 2017-11-09: qty 1

## 2017-11-09 MED ORDER — BUPIVACAINE LIPOSOME 1.3 % IJ SUSP
INTRAMUSCULAR | Status: DC | PRN
  Administered 2017-11-09: 20 mL

## 2017-11-09 MED ORDER — FLEET ENEMA 7-19 GM/118ML RE ENEM: 1.0000 | ENEMA | Freq: Once | RECTAL | Status: DC | PRN

## 2017-11-09 MED ORDER — LORATADINE 10 MG PO TABS: 10.0000 mg | ORAL_TABLET | Freq: Every day | ORAL | Status: DC | PRN

## 2017-11-09 MED ORDER — SODIUM CHLORIDE 0.9 % IJ SOLN
INTRAMUSCULAR | Status: DC | PRN
Start: 2017-11-09 — End: 2017-11-09
  Administered 2017-11-09: 60 mL

## 2017-11-09 MED ORDER — SODIUM CHLORIDE 0.9 % IJ SOLN
INTRAMUSCULAR | Status: AC
  Filled 2017-11-09: qty 10

## 2017-11-09 SURGICAL SUPPLY — 50 items
BAG DECANTER FOR FLEXI CONT (MISCELLANEOUS) ×2 IMPLANT
BAG SPEC THK2 15X12 ZIP CLS (MISCELLANEOUS) ×1
BAG ZIPLOCK 12X15 (MISCELLANEOUS) ×2 IMPLANT
BANDAGE ACE 6X5 VEL STRL LF (GAUZE/BANDAGES/DRESSINGS) ×2 IMPLANT
BLADE SAG 18X100X1.27 (BLADE) ×2 IMPLANT
BLADE SAW SGTL 11.0X1.19X90.0M (BLADE) ×2 IMPLANT
BOWL SMART MIX CTS (DISPOSABLE) ×2 IMPLANT
CAPT KNEE TOTAL 3 ATTUNE ×1 IMPLANT
CEMENT HV SMART SET (Cement) ×4 IMPLANT
COVER SURGICAL LIGHT HANDLE (MISCELLANEOUS) ×2 IMPLANT
CUFF TOURN SGL QUICK 34 (TOURNIQUET CUFF) ×2
CUFF TRNQT CYL 34X4X40X1 (TOURNIQUET CUFF) ×1 IMPLANT
DECANTER SPIKE VIAL GLASS SM (MISCELLANEOUS) ×2 IMPLANT
DRAPE TOP 10253 STERILE (DRAPES) IMPLANT
DRAPE U-SHAPE 47X51 STRL (DRAPES) ×2 IMPLANT
DRSG ADAPTIC 3X8 NADH LF (GAUZE/BANDAGES/DRESSINGS) ×2 IMPLANT
DRSG PAD ABDOMINAL 8X10 ST (GAUZE/BANDAGES/DRESSINGS) ×2 IMPLANT
DURAPREP 26ML APPLICATOR (WOUND CARE) ×2 IMPLANT
ELECT REM PT RETURN 15FT ADLT (MISCELLANEOUS) ×2 IMPLANT
EVACUATOR 1/8 PVC DRAIN (DRAIN) ×2 IMPLANT
GAUZE SPONGE 4X4 12PLY STRL (GAUZE/BANDAGES/DRESSINGS) ×2 IMPLANT
GLOVE BIO SURGEON STRL SZ7.5 (GLOVE) IMPLANT
GLOVE BIO SURGEON STRL SZ8 (GLOVE) ×2 IMPLANT
GLOVE BIOGEL PI IND STRL 6.5 (GLOVE) IMPLANT
GLOVE BIOGEL PI IND STRL 8 (GLOVE) ×1 IMPLANT
GLOVE BIOGEL PI INDICATOR 6.5 (GLOVE) ×4
GLOVE BIOGEL PI INDICATOR 8 (GLOVE) ×1
GLOVE SURG SS PI 6.5 STRL IVOR (GLOVE) IMPLANT
GOWN STRL REUS W/TWL LRG LVL3 (GOWN DISPOSABLE) ×4 IMPLANT
GOWN STRL REUS W/TWL XL LVL3 (GOWN DISPOSABLE) ×1 IMPLANT
HANDPIECE INTERPULSE COAX TIP (DISPOSABLE) ×2
IMMOBILIZER KNEE 20 (SOFTGOODS) ×2
IMMOBILIZER KNEE 20 THIGH 36 (SOFTGOODS) ×1 IMPLANT
MANIFOLD NEPTUNE II (INSTRUMENTS) ×2 IMPLANT
NS IRRIG 1000ML POUR BTL (IV SOLUTION) ×2 IMPLANT
PACK TOTAL KNEE CUSTOM (KITS) ×2 IMPLANT
PADDING CAST COTTON 6X4 STRL (CAST SUPPLIES) ×4 IMPLANT
POSITIONER SURGICAL ARM (MISCELLANEOUS) ×2 IMPLANT
SET HNDPC FAN SPRY TIP SCT (DISPOSABLE) ×1 IMPLANT
STRIP CLOSURE SKIN 1/2X4 (GAUZE/BANDAGES/DRESSINGS) ×3 IMPLANT
SUT MNCRL AB 4-0 PS2 18 (SUTURE) ×2 IMPLANT
SUT STRATAFIX 0 PDS 27 VIOLET (SUTURE) ×2
SUT VIC AB 2-0 CT1 27 (SUTURE) ×6
SUT VIC AB 2-0 CT1 TAPERPNT 27 (SUTURE) ×3 IMPLANT
SUTURE STRATFX 0 PDS 27 VIOLET (SUTURE) ×1 IMPLANT
SYR 50ML LL SCALE MARK (SYRINGE) ×2 IMPLANT
TRAY FOLEY CATH 14FR (SET/KITS/TRAYS/PACK) ×1 IMPLANT
WATER STERILE IRR 1000ML POUR (IV SOLUTION) ×4 IMPLANT
WRAP KNEE MAXI GEL POST OP (GAUZE/BANDAGES/DRESSINGS) ×2 IMPLANT
YANKAUER SUCT BULB TIP 10FT TU (MISCELLANEOUS) ×2 IMPLANT

## 2017-11-09 NOTE — Op Note (Signed)
OPERATIVE REPORT-TOTAL KNEE ARTHROPLASTY   Pre-operative diagnosis- Osteoarthritis  Right knee(s)  Post-operative diagnosis- Osteoarthritis Right knee(s)  Procedure-  Right  Total Knee Arthroplasty  Surgeon- Dione Plover. Willow Reczek, MD  Assistant- Arlee Muslim, PA-C   Anesthesia-  Adductor canal block and spinal  EBL-100 mL   Drains Hemovac  Tourniquet time-  Total Tourniquet Time Documented: Thigh (Right) - 32 minutes Total: Thigh (Right) - 32 minutes     Complications- None  Condition-PACU - hemodynamically stable.   Brief Clinical Note  Melissa Holloway is a 72 y.o. year old female with end stage OA of her right knee with progressively worsening pain and dysfunction. She has constant pain, with activity and at rest and significant functional deficits with difficulties even with ADLs. She has had extensive non-op management including analgesics, injections of cortisone and viscosupplements, and home exercise program, but remains in significant pain with significant dysfunction.Radiographs show bone on bone arthritis medial and patellofemoral. She presents now for right Total Knee Arthroplasty.    Procedure in detail---   The patient is brought into the operating room and positioned supine on the operating table. After successful administration of  Adductor canal block and spinal,   a tourniquet is placed high on the  Right thigh(s) and the lower extremity is prepped and draped in the usual sterile fashion. Time out is performed by the operating team and then the  Right lower extremity is wrapped in Esmarch, knee flexed and the tourniquet inflated to 300 mmHg.       A midline incision is made with a ten blade through the subcutaneous tissue to the level of the extensor mechanism. A fresh blade is used to make a medial parapatellar arthrotomy. Soft tissue over the proximal medial tibia is subperiosteally elevated to the joint line with a knife and into the semimembranosus bursa with a Cobb  elevator. Soft tissue over the proximal lateral tibia is elevated with attention being paid to avoiding the patellar tendon on the tibial tubercle. The patella is everted, knee flexed 90 degrees and the ACL and PCL are removed. Findings are bone on bone medial and patellofemoral with large global osteophytes.        The drill is used to create a starting hole in the distal femur and the canal is thoroughly irrigated with sterile saline to remove the fatty contents. The 5 degree Right  valgus alignment guide is placed into the femoral canal and the distal femoral cutting block is pinned to remove 9 mm off the distal femur. Resection is made with an oscillating saw.      The tibia is subluxed forward and the menisci are removed. The extramedullary alignment guide is placed referencing proximally at the medial aspect of the tibial tubercle and distally along the second metatarsal axis and tibial crest. The block is pinned to remove 7mm off the more deficient medial  side. Resection is made with an oscillating saw. Size 5is the most appropriate size for the tibia and the proximal tibia is prepared with the modular drill and keel punch for that size.      The femoral sizing guide is placed and size 6 is most appropriate. Rotation is marked off the epicondylar axis and confirmed by creating a rectangular flexion gap at 90 degrees. The size 6 cutting block is pinned in this rotation and the anterior, posterior and chamfer cuts are made with the oscillating saw. The intercondylar block is then placed and that cut is made.  Trial size 5 tibial component, trial size 6 narrow posterior stabilized femur and a 10  mm posterior stabilized rotating platform insert trial is placed. Full extension is achieved with excellent varus/valgus and anterior/posterior balance throughout full range of motion. The patella is everted and thickness measured to be 24  mm. Free hand resection is taken to 14 mm, a 38 template is placed, lug  holes are drilled, trial patella is placed, and it tracks normally. Osteophytes are removed off the posterior femur with the trial in place. All trials are removed and the cut bone surfaces prepared with pulsatile lavage. Cement is mixed and once ready for implantation, the size 5 tibial implant, size  6 narrow posterior stabilized femoral component, and the size 38 patella are cemented in place and the patella is held with the clamp. The trial insert is placed and the knee held in full extension. The Exparel (20 ml mixed with 60 ml saline) is injected into the extensor mechanism, posterior capsule, medial and lateral gutters and subcutaneous tissues.  All extruded cement is removed and once the cement is hard the permanent 10 mm posterior stabilized rotating platform insert is placed into the tibial tray.      The wound is copiously irrigated with saline solution and the extensor mechanism closed over a hemovac drain with #1 V-loc suture. The tourniquet is released for a total tourniquet time of 32  minutes. Flexion against gravity is 140 degrees and the patella tracks normally. Subcutaneous tissue is closed with 2.0 vicryl and subcuticular with running 4.0 Monocryl. The incision is cleaned and dried and steri-strips and a bulky sterile dressing are applied. The limb is placed into a knee immobilizer and the patient is awakened and transported to recovery in stable condition.      Please note that a surgical assistant was a medical necessity for this procedure in order to perform it in a safe and expeditious manner. Surgical assistant was necessary to retract the ligaments and vital neurovascular structures to prevent injury to them and also necessary for proper positioning of the limb to allow for anatomic placement of the prosthesis.   Dione Plover Dnaiel Voller, MD    11/09/2017, 9:15 AM

## 2017-11-09 NOTE — Transfer of Care (Signed)
Immediate Anesthesia Transfer of Care Note  Patient: Melissa Holloway  Procedure(s) Performed: RIGHT TOTAL KNEE ARTHROPLASTY (Right Knee)  Patient Location: PACU  Anesthesia Type:Spinal  Level of Consciousness: awake, alert  and oriented  Airway & Oxygen Therapy: Patient Spontanous Breathing and Patient connected to face mask oxygen  Post-op Assessment: Report given to RN and Post -op Vital signs reviewed and stable  Post vital signs: Reviewed and stable  Last Vitals:  Vitals Value Taken Time  BP    Temp    Pulse 72 11/09/2017  9:48 AM  Resp 20 11/09/2017  9:48 AM  SpO2 100 % 11/09/2017  9:48 AM  Vitals shown include unvalidated device data.  Last Pain:  Vitals:   11/09/17 0652  TempSrc:   PainSc: 0-No pain      Patients Stated Pain Goal: 4 (23/53/61 [id number])  Complications: No apparent anesthesia complications

## 2017-11-09 NOTE — Anesthesia Procedure Notes (Signed)
Spinal  Patient location during procedure: OR Start time: 11/09/2017 8:17 AM End time: 11/09/2017 8:20 AM Staffing Anesthesiologist: Lillia Abed, MD Performed: anesthesiologist  Preanesthetic Checklist Completed: patient identified, surgical consent, pre-op evaluation, timeout performed, IV checked, risks and benefits discussed and monitors and equipment checked Spinal Block Patient position: sitting Prep: DuraPrep Patient monitoring: heart rate, cardiac monitor, continuous pulse ox and blood pressure Approach: right paramedian Location: L3-4 Injection technique: single-shot Needle Needle type: Pencan  Needle gauge: 24 G Needle length: 9 cm Needle insertion depth: 6 cm

## 2017-11-09 NOTE — Interval H&P Note (Signed)
History and Physical Interval Note:  11/09/2017 6:28 AM  Melissa Holloway  has presented today for surgery, with the diagnosis of Osteoarthritis Right Knee  The various methods of treatment have been discussed with the patient and family. After consideration of risks, benefits and other options for treatment, the patient has consented to  Procedure(s): RIGHT TOTAL KNEE ARTHROPLASTY (Right) as a surgical intervention .  The patient's history has been reviewed, patient examined, no change in status, stable for surgery.  I have reviewed the patient's chart and labs.  Questions were answered to the patient's satisfaction.     Pilar Plate Sriansh Farra

## 2017-11-09 NOTE — Anesthesia Procedure Notes (Signed)
Anesthesia Regional Block: Adductor canal block   Pre-Anesthetic Checklist: ,, timeout performed, Correct Patient, Correct Site, Correct Laterality, Correct Procedure, Correct Position, site marked, Risks and benefits discussed,  Surgical consent,  Pre-op evaluation,  At surgeon's request and post-op pain management  Laterality: Right  Prep: chloraprep       Needles:  Injection technique: Single-shot  Needle Type: Echogenic Stimulator Needle     Needle Length: 9cm  Needle Gauge: 21     Additional Needles:   Narrative:  Start time: 11/09/2017 7:40 AM End time: 11/09/2017 7:50 AM Injection made incrementally with aspirations every 5 mL.  Performed by: Personally  Anesthesiologist: Lillia Abed, MD  Additional Notes: Monitors applied. Patient sedated. Sterile prep and drape,hand hygiene and sterile gloves were used. Relevant anatomy identified.Needle position confirmed.Local anesthetic injected incrementally after negative aspiration. Local anesthetic spread visualized around nerve(s). Vascular puncture avoided. No complications. Image printed for medical record.The patient tolerated the procedure well.    Lillia Abed MD

## 2017-11-09 NOTE — Evaluation (Signed)
Physical Therapy Evaluation Patient Details Name: Melissa Holloway MRN: [id number] DOB: [birth year] Today's Date: 11/09/2017   History of Present Illness  Pt is a 72 year old female s/p R TKA with hx of L TKA and L breast cancer  Clinical Impression  Pt is s/p TKA resulting in the deficits listed below (see PT Problem List).  Pt will benefit from skilled PT to increase their independence and safety with mobility to allow discharge to the venue listed below.  Pt assisted with ambulating in hallway short distance POD #0 and plans to d/c home with spouse.       Follow Up Recommendations Follow surgeon's recommendation for DC plan and follow-up therapies    Equipment Recommendations  None recommended by PT    Recommendations for Other Services       Precautions / Restrictions Precautions Precautions: Knee Required Braces or Orthoses: Knee Immobilizer - Right Restrictions Other Position/Activity Restrictions: WBAT      Mobility  Bed Mobility Overal bed mobility: Needs Assistance Bed Mobility: Supine to Sit     Supine to sit: Min assist     General bed mobility comments: verbal cues for technique, assist for R LE  Transfers Overall transfer level: Needs assistance Equipment used: Rolling walker (2 wheeled) Transfers: Sit to/from Stand Sit to Stand: Min guard         General transfer comment: verbal cues for UE and LE positioning  Ambulation/Gait Ambulation/Gait assistance: Min guard Ambulation Distance (Feet): 40 Feet Assistive device: Rolling walker (2 wheeled) Gait Pattern/deviations: Step-to pattern;Decreased stance time - right;Antalgic     General Gait Details: verbal cues for sequence, RW positioning, step length  Stairs            Wheelchair Mobility    Modified Rankin (Stroke Patients Only)       Balance                                             Pertinent Vitals/Pain Pain Assessment: 0-10 Pain Score: 4  Pain Location: R  knee Pain Descriptors / Indicators: Sore;Aching Pain Intervention(s): Limited activity within patient's tolerance;Repositioned;Monitored during session    Home Living Family/patient expects to be discharged to:: Private residence Living Arrangements: Spouse/significant other Available Help at Discharge: Family;Available 24 hours/day Type of Home: House Home Access: Stairs to enter Entrance Stairs-Rails: None Entrance Stairs-Number of Steps: 3 Home Layout: One level Home Equipment: Walker - 2 wheels;Cane - single point      Prior Function Level of Independence: Independent with assistive device(s)               Hand Dominance        Extremity/Trunk Assessment        Lower Extremity Assessment Lower Extremity Assessment: RLE deficits/detail RLE Deficits / Details: able to perform SLR, ROM TBA, able to perform ankle pumps       Communication   Communication: No difficulties  Cognition Arousal/Alertness: Awake/alert Behavior During Therapy: WFL for tasks assessed/performed Overall Cognitive Status: Within Functional Limits for tasks assessed                                        General Comments      Exercises     Assessment/Plan    PT Assessment Patient  needs continued PT services  PT Problem List Decreased strength;Decreased range of motion;Pain;Decreased mobility       PT Treatment Interventions DME instruction;Therapeutic activities;Gait training;Therapeutic exercise;Patient/family education;Stair training;Functional mobility training    PT Goals (Current goals can be found in the Care Plan section)  Acute Rehab PT Goals PT Goal Formulation: With patient Time For Goal Achievement: 11/16/17 Potential to Achieve Goals: Good    Frequency 7X/week   Barriers to discharge        Co-evaluation               AM-PAC PT "6 Clicks" Daily Activity  Outcome Measure Difficulty turning over in bed (including adjusting bedclothes,  sheets and blankets)?: A Little Difficulty moving from lying on back to sitting on the side of the bed? : Unable Difficulty sitting down on and standing up from a chair with arms (e.g., wheelchair, bedside commode, etc,.)?: Unable Help needed moving to and from a bed to chair (including a wheelchair)?: A Little Help needed walking in hospital room?: A Little Help needed climbing 3-5 steps with a railing? : A Lot 6 Click Score: 13    End of Session Equipment Utilized During Treatment: Gait belt Activity Tolerance: Patient tolerated treatment well Patient left: in chair;with chair alarm set;with call bell/phone within reach;with family/visitor present Nurse Communication: Mobility status PT Visit Diagnosis: Other abnormalities of gait and mobility (R26.89)    Time: 6226-3335 PT Time Calculation (min) (ACUTE ONLY): 22 min   Charges:   PT Evaluation $PT Eval Low Complexity: 1 Low     PT G CodesCarmelia Bake, PT, DPT 11/09/2017 Pager: [phone]  York Ram E 11/09/2017, 3:30 PM

## 2017-11-09 NOTE — Anesthesia Preprocedure Evaluation (Signed)
Anesthesia Evaluation  Patient identified by MRN, date of birth, ID band Patient awake    Reviewed: Allergy & Precautions, NPO status , Patient's Chart, lab work & pertinent test results  History of Anesthesia Complications (+) PONV  Airway Mallampati: I  TM Distance: >3 FB Neck ROM: Full    Dental   Pulmonary    Pulmonary exam normal        Cardiovascular Normal cardiovascular exam     Neuro/Psych    GI/Hepatic   Endo/Other    Renal/GU      Musculoskeletal   Abdominal   Peds  Hematology   Anesthesia Other Findings   Reproductive/Obstetrics                             Anesthesia Physical Anesthesia Plan  ASA: II  Anesthesia Plan: Spinal   Post-op Pain Management:  Regional for Post-op pain   Induction:   PONV Risk Score and Plan: 3 and Ondansetron, Dexamethasone and Midazolam  Airway Management Planned: Simple Face Mask  Additional Equipment:   Intra-op Plan:   Post-operative Plan:   Informed Consent: I have reviewed the patients History and Physical, chart, labs and discussed the procedure including the risks, benefits and alternatives for the proposed anesthesia with the patient or authorized representative who has indicated his/her understanding and acceptance.     Plan Discussed with: CRNA and Surgeon  Anesthesia Plan Comments:         Anesthesia Quick Evaluation

## 2017-11-09 NOTE — Progress Notes (Signed)
AssistedDr. Ossey with right, ultrasound guided, adductor canal block. Side rails up, monitors on throughout procedure. See vital signs in flow sheet. Tolerated Procedure well.  

## 2017-11-09 NOTE — Anesthesia Procedure Notes (Signed)
Date/Time: 11/09/2017 8:20 AM Performed by: Talbot Grumbling, CRNA Oxygen Delivery Method: Simple face mask

## 2017-11-09 NOTE — Anesthesia Postprocedure Evaluation (Signed)
Anesthesia Post Note  Patient: LAKEISA HENINGER  Procedure(s) Performed: RIGHT TOTAL KNEE ARTHROPLASTY (Right Knee)     Patient location during evaluation: PACU Anesthesia Type: Spinal Level of consciousness: oriented and awake and alert Pain management: pain level controlled Vital Signs Assessment: post-procedure vital signs reviewed and stable Respiratory status: spontaneous breathing, respiratory function stable and patient connected to nasal cannula oxygen Cardiovascular status: blood pressure returned to baseline and stable Postop Assessment: no headache, no backache and no apparent nausea or vomiting Anesthetic complications: no    Last Vitals:  Vitals:   11/09/17 1030 11/09/17 1045  BP: 116/67 134/70  Pulse: (!) 54 (!) 50  Resp: 16 15  Temp:    SpO2: 100% 100%    Last Pain:  Vitals:   11/09/17 1030  TempSrc:   PainSc: 0-No pain                 Deveion Denz DAVID

## 2017-11-10 DIAGNOSIS — M25761 Osteophyte, right knee: Secondary | ICD-10-CM | POA: Diagnosis not present

## 2017-11-10 DIAGNOSIS — Z79899 Other long term (current) drug therapy: Secondary | ICD-10-CM | POA: Diagnosis not present

## 2017-11-10 DIAGNOSIS — Z6833 Body mass index (BMI) 33.0-33.9, adult: Secondary | ICD-10-CM | POA: Diagnosis not present

## 2017-11-10 DIAGNOSIS — Z853 Personal history of malignant neoplasm of breast: Secondary | ICD-10-CM | POA: Diagnosis not present

## 2017-11-10 DIAGNOSIS — Z96652 Presence of left artificial knee joint: Secondary | ICD-10-CM | POA: Diagnosis not present

## 2017-11-10 DIAGNOSIS — E559 Vitamin D deficiency, unspecified: Secondary | ICD-10-CM | POA: Diagnosis not present

## 2017-11-10 DIAGNOSIS — E7800 Pure hypercholesterolemia, unspecified: Secondary | ICD-10-CM | POA: Diagnosis not present

## 2017-11-10 DIAGNOSIS — M1710 Unilateral primary osteoarthritis, unspecified knee: Secondary | ICD-10-CM | POA: Diagnosis present

## 2017-11-10 DIAGNOSIS — Z7982 Long term (current) use of aspirin: Secondary | ICD-10-CM | POA: Diagnosis not present

## 2017-11-10 DIAGNOSIS — M179 Osteoarthritis of knee, unspecified: Secondary | ICD-10-CM | POA: Diagnosis present

## 2017-11-10 DIAGNOSIS — M1711 Unilateral primary osteoarthritis, right knee: Secondary | ICD-10-CM | POA: Diagnosis not present

## 2017-11-10 DIAGNOSIS — E669 Obesity, unspecified: Secondary | ICD-10-CM | POA: Diagnosis not present

## 2017-11-10 DIAGNOSIS — K219 Gastro-esophageal reflux disease without esophagitis: Secondary | ICD-10-CM | POA: Diagnosis not present

## 2017-11-10 DIAGNOSIS — M25561 Pain in right knee: Secondary | ICD-10-CM | POA: Diagnosis not present

## 2017-11-10 LAB — BASIC METABOLIC PANEL
Anion gap: 8 (ref 5–15)
BUN: 8 mg/dL (ref 6–20)
CHLORIDE: 107 mmol/L (ref 101–111)
CO2: 26 mmol/L (ref 22–32)
Calcium: 8.7 mg/dL — ABNORMAL LOW (ref 8.9–10.3)
Creatinine, Ser: 0.63 mg/dL (ref 0.44–1.00)
GFR calc Af Amer: >60 mL/min (ref >60)
Glucose, Bld: 107 mg/dL — ABNORMAL HIGH (ref 65–99)
Potassium: 3.9 mmol/L (ref 3.5–5.1)
SODIUM: 141 mmol/L (ref 135–145)

## 2017-11-10 LAB — CBC
HCT: 33.4 % — ABNORMAL LOW (ref 36.0–46.0)
HEMOGLOBIN: 10.8 g/dL — AB (ref 12.0–15.0)
MCH: 29.3 pg (ref 26.0–34.0)
MCHC: 32.3 g/dL (ref 30.0–36.0)
MCV: 90.5 fL (ref 78.0–100.0)
Platelets: 206 10*3/uL (ref 150–400)
RBC: 3.69 MIL/uL — ABNORMAL LOW (ref 3.87–5.11)
RDW: 14 % (ref 11.5–15.5)
WBC: 9 10*3/uL (ref 4.0–10.5)

## 2017-11-10 MED ORDER — TRAMADOL HCL 50 MG PO TABS: 50.0000 mg | ORAL_TABLET | Freq: Four times a day (QID) | ORAL | Status: DC | PRN

## 2017-11-10 MED ORDER — SODIUM CHLORIDE 0.9 % IV BOLUS
500.0000 mL | Freq: Once | INTRAVENOUS | Status: AC
  Administered 2017-11-10: 500 mL via INTRAVENOUS

## 2017-11-10 MED ORDER — METHOCARBAMOL 500 MG PO TABS: 500.0000 mg | ORAL_TABLET | Freq: Four times a day (QID) | ORAL | 0 refills | Status: DC | PRN

## 2017-11-10 MED ORDER — OXYCODONE HCL 5 MG PO TABS: 5.0000 mg | ORAL_TABLET | ORAL | 0 refills | Status: DC | PRN

## 2017-11-10 MED ORDER — RIVAROXABAN 10 MG PO TABS: 10.0000 mg | ORAL_TABLET | Freq: Every day | ORAL | 0 refills | Status: DC

## 2017-11-10 MED ORDER — TRAMADOL HCL 50 MG PO TABS: 50.0000 mg | ORAL_TABLET | Freq: Four times a day (QID) | ORAL | 0 refills | Status: DC | PRN

## 2017-11-10 NOTE — Progress Notes (Signed)
   Subjective: 1 Day Post-Op Procedure(s) (LRB): RIGHT TOTAL KNEE ARTHROPLASTY (Right) Patient reports pain as mild.   Patient seen in rounds with Dr. Wynelle Link. Patient is well, but has had some minor complaints of pain in the knee, requiring pain medications   Pt ambulated to bathroom with CNA, became dizzy and light-headed. Bp dropped to 81/47. Pt got back in bed and bp lying was 104/50.  Pt states no SOB or chest pain, minimal pain, but pain became worse when ambulating.   We will start therapy today.  If they do well with therapy and meets all goals, then will allow home later this afternoon following therapy. Plan is to go Home after hospital stay.  Objective: Vital signs in last 24 hours: Temp:  [97.3 F (36.3 C)-98 F (36.7 C)] 98 F (36.7 C) (04/09 0911) Pulse Rate:  [49-72] 63 (04/09 0911) Resp:  [14-20] 15 (04/09 0911) BP: (81-147)/(47-70) 127/53 (04/09 0911) SpO2:  [92 %-100 %] 99 % (04/09 0911)  Intake/Output from previous day:  Intake/Output Summary (Last 24 hours) at 11/10/2017 0930 Last data filed at 11/10/2017 0912 Gross per 24 hour  Intake 3367.37 ml  Output 3085 ml  Net 282.37 ml    Intake/Output this shift: Total I/O In: 240 [P.O.:240] Out: 300 [Urine:300]  Labs: Recent Labs    11/10/17 0518  HGB 10.8*   Recent Labs    11/10/17 0518  WBC 9.0  RBC 3.69*  HCT 33.4*  PLT 206   Recent Labs    11/10/17 0518  NA 141  K 3.9  CL 107  CO2 26  BUN 8  CREATININE 0.63  GLUCOSE 107*  CALCIUM 8.7*   No results for input(s): LABPT, INR in the last 72 hours.  EXAM General - Patient is Alert, Appropriate and Oriented Extremity - Neurovascular intact Sensation intact distally Dressing - dressing C/D/I Motor Function - intact, moving foot and toes well on exam.  Hemovac pulled without difficulty.  Past Medical History:  Diagnosis Date  . Arthritis    right knee; right pinky finger  . Breast cancer, left breast ([place]) 08/23/2014  . Esophageal  hernia    s/p  . Family history of breast cancer   . Personal history of radiation therapy   . PONV (postoperative nausea and vomiting)   . S/P radiation therapy 01/10/2015 through 02/21/2015    Left breast 5000 cGy 25 sessions, left breast boost 1000 cGy 5 sessions      Assessment/Plan: 1 Day Post-Op Procedure(s) (LRB): RIGHT TOTAL KNEE ARTHROPLASTY (Right) Principal Problem:   Arthritis of left knee Active Problems:   OA (osteoarthritis) of knee  Estimated body mass index is 33.83 kg/m as calculated from the following:   Height as of this encounter: 5\' 3"  (1.6 m).   Weight as of this encounter: 86.6 kg (191 lb). Up with therapy  DVT Prophylaxis - Xarelto Weight-Bearing as tolerated to right leg D/C O2 and Pulse OX and try on Room Air  If meets goals and able to go home: Up with therapy Diet - Regular diet Follow up - in 2 weeks Activity - WBAT Disposition - Home Condition Upon Discharge - Stable D/C Meds - See DC Summary DVT Prophylaxis - [place], PA-C Orthopaedic Surgery

## 2017-11-10 NOTE — Plan of Care (Signed)
Plan of care discussed.   

## 2017-11-10 NOTE — Discharge Summary (Signed)
Physician Discharge Summary   Patient ID: Melissa Holloway MRN: [id number] DOB/AGE: 01-05-1946 72 y.o.  Admit date: 11/09/2017 Discharge date: 11/10/2017  Primary Diagnosis:  Osteoarthritis  Right knee(s)   Admission Diagnoses:  Past Medical History:  Diagnosis Date  . Arthritis    right knee; right pinky finger  . Breast cancer, left breast ([facility]) 08/23/2014  . Esophageal hernia    s/p  . Family history of breast cancer   . Personal history of radiation therapy   . PONV (postoperative nausea and vomiting)   . S/P radiation therapy 01/10/2015 through 02/21/2015    Left breast 5000 cGy 25 sessions, left breast boost 1000 cGy 5 sessions     Discharge Diagnoses:   Principal Problem:   Arthritis of left knee Active Problems:   OA (osteoarthritis) of knee   Osteoarthritis of knee  Estimated body mass index is 33.83 kg/m as calculated from the following:   Height as of this encounter: '5\' 3"'  (1.6 m).   Weight as of this encounter: 86.6 kg (191 lb).  Procedure:  Procedure(s) (LRB): RIGHT TOTAL KNEE ARTHROPLASTY (Right)   Consults: None  HPI: Melissa Holloway is a 72 y.o. year old female with end stage OA of her right knee with progressively worsening pain and dysfunction. She has constant pain, with activity and at rest and significant functional deficits with difficulties even with ADLs. She has had extensive non-op management including analgesics, injections of cortisone and viscosupplements, and home exercise program, but remains in significant pain with significant dysfunction.Radiographs show bone on bone arthritis medial and patellofemoral. She presents now for right Total Knee Arthroplasty.     Laboratory Data: Admission on 11/09/2017, Discharged on 11/10/2017  Component Date Value Ref Range Status  . WBC 11/10/2017 9.0  4.0 - 10.5 K/uL Final  . RBC 11/10/2017 3.69* 3.87 - 5.11 MIL/uL Final  . Hemoglobin  11/10/2017 10.8* 12.0 - 15.0 g/dL Final  . HCT 11/10/2017 33.4* 36.0 - 46.0 % Final  . MCV 11/10/2017 90.5  78.0 - 100.0 fL Final  . MCH 11/10/2017 29.3  26.0 - 34.0 pg Final  . MCHC 11/10/2017 32.3  30.0 - 36.0 g/dL Final  . RDW 11/10/2017 14.0  11.5 - 15.5 % Final  . Platelets 11/10/2017 206  150 - 400 K/uL Final   Performed at [facility], [place] [address] [place], [place] [address]  . Sodium 11/10/2017 141  135 - 145 mmol/L Final  . Potassium 11/10/2017 3.9  3.5 - 5.1 mmol/L Final  . Chloride 11/10/2017 107  101 - 111 mmol/L Final  . CO2 11/10/2017 26  22 - 32 mmol/L Final  . Glucose, Bld 11/10/2017 107* 65 - 99 mg/dL Final  . BUN 11/10/2017 8  6 - 20 mg/dL Final  . Creatinine, Ser 11/10/2017 0.63  0.44 - 1.00 mg/dL Final  . Calcium 11/10/2017 8.7* 8.9 - 10.3 mg/dL Final  . GFR calc non Af Amer 11/10/2017 >60  >60 mL/min Final  . GFR calc Af Amer 11/10/2017 >60  >60 mL/min Final   Comment: (NOTE) The eGFR has been calculated using the CKD EPI equation. This calculation has not been validated in all clinical situations. eGFR's persistently <60 mL/min signify possible Chronic Kidney Disease.   Georgiann Hahn gap 11/10/2017 8  5 - 15 Final   Performed at [facility], [place] [address] [place], [place] [address]  Hospital Outpatient Visit on 11/02/2017  Component Date Value Ref Range Status  . aPTT  11/02/2017 27  24 - 36 seconds Final   Performed at [facility], [place] [address], [place], [place] [address]  . WBC 11/02/2017 4.3  4.0 - 10.5 K/uL Final  . RBC 11/02/2017 4.93  3.87 - 5.11 MIL/uL Final  . Hemoglobin 11/02/2017 14.1  12.0 - 15.0 g/dL Final  . HCT 11/02/2017 44.8  36.0 - 46.0 % Final  . MCV 11/02/2017 90.9  78.0 - 100.0 fL Final  . MCH 11/02/2017 28.6  26.0 - 34.0 pg Final  . MCHC 11/02/2017 31.5  30.0 - 36.0 g/dL Final  . RDW 11/02/2017 14.0  11.5 - 15.5 % Final  . Platelets 11/02/2017 261  150 - 400 K/uL Final    Performed at [facility], [place] [address] [place], [place] [address]  . Sodium 11/02/2017 142  135 - 145 mmol/L Final  . Potassium 11/02/2017 5.1  3.5 - 5.1 mmol/L Final  . Chloride 11/02/2017 107  101 - 111 mmol/L Final  . CO2 11/02/2017 27  22 - 32 mmol/L Final  . Glucose, Bld 11/02/2017 108* 65 - 99 mg/dL Final  . BUN 11/02/2017 15  6 - 20 mg/dL Final  . Creatinine, Ser 11/02/2017 0.79  0.44 - 1.00 mg/dL Final  . Calcium 11/02/2017 9.8  8.9 - 10.3 mg/dL Final  . Total Protein 11/02/2017 7.0  6.5 - 8.1 g/dL Final  . Albumin 11/02/2017 4.1  3.5 - 5.0 g/dL Final  . AST 11/02/2017 27  15 - 41 U/L Final  . ALT 11/02/2017 23  14 - 54 U/L Final  . Alkaline Phosphatase 11/02/2017 53  38 - 126 U/L Final  . Total Bilirubin 11/02/2017 0.7  0.3 - 1.2 mg/dL Final  . GFR calc non Af Amer 11/02/2017 >60  >60 mL/min Final  . GFR calc Af Amer 11/02/2017 >60  >60 mL/min Final   Comment: (NOTE) The eGFR has been calculated using the CKD EPI equation. This calculation has not been validated in all clinical situations. eGFR's persistently <60 mL/min signify possible Chronic Kidney Disease.   Georgiann Hahn gap 11/02/2017 8  5 - 15 Final   Performed at [facility], [place] [address] [place], [place] [address]  . Prothrombin Time 11/02/2017 14.2  11.4 - 15.2 seconds Final  . INR 11/02/2017 1.11   Final   Performed at [facility], [place] [address], [place], [place] [address]  . ABO/RH(D) 11/02/2017 O POS   Final  . Antibody Screen 11/02/2017 NEG   Final  . Sample Expiration 11/02/2017 11/12/2017   Final  . Extend sample reason 11/02/2017    Final                   Value:NO TRANSFUSIONS OR PREGNANCY IN THE PAST 3 MONTHS Performed at [facility], [place] [address], [place], [place] [address]   . MRSA, PCR 11/02/2017 NEGATIVE  NEGATIVE Final  . Staphylococcus aureus 11/02/2017 NEGATIVE  NEGATIVE Final   Comment: (NOTE) The Xpert SA Assay  (FDA approved for NASAL specimens in patients 32 years of age and older), is one component of a comprehensive surveillance program. It is not intended to diagnose infection nor to guide or monitor treatment. Performed at [facility], [place] [address] [place], [place] [address]   . ABO/RH(D) 11/02/2017    Final                   Value:O POS Performed at [facility]  [place] [address], [place], [place] [address]      X-Rays:No results found.  EKG: Orders placed or performed during the hospital encounter of 01/03/14  . EKG  . EKG  . EKG 12-Lead  . EKG 12-Lead     Hospital Course: Melissa Holloway is a 72 y.o. who was admitted to [facility]. They were brought to the operating room on 11/09/2017 and underwent Procedure(s): RIGHT TOTAL KNEE ARTHROPLASTY.  Patient tolerated the procedure well and was later transferred to the recovery room and then to the orthopaedic floor for postoperative care.  They were given PO and IV analgesics for pain control following their surgery.  They were given 24 hours of postoperative antibiotics of  Anti-infectives (From admission, onward)   Start     Dose/Rate Route Frequency Ordered Stop   11/09/17 0643  ceFAZolin (ANCEF) IVPB 2g/100 mL premix     2 g 200 mL/hr over 30 Minutes Intravenous On call to O.R. 11/09/17 4401 11/09/17 0825     and started on DVT prophylaxis in the form of Xarelto.   PT and OT were ordered for total joint protocol.  Discharge planning consulted to help with postop disposition and equipment needs.  Patient had a decent night on the evening of surgery.  They started to get up OOB with therapy on day one. Hemovac drain was pulled without difficulty.  Dressing was checked and looked okay. Patient was seen in rounds on day one, did well with therapy and was ready to go home.  Diet - Regular diet Follow up - in 2 weeks Activity - WBAT Disposition - Home Condition Upon Discharge - Stable D/C  Meds - See DC Summary DVT Prophylaxis - Xarelto      Discharge Instructions    Call MD / Call 911   Complete by:  As directed    If you experience chest pain or shortness of breath, CALL 911 and be transported to the hospital emergency room.  If you develope a fever above 101 F, pus (white drainage) or increased drainage or redness at the wound, or calf pain, call your surgeon's office.   Change dressing   Complete by:  As directed    Change dressing daily with sterile 4 x 4 inch gauze dressing and apply TED hose. Do not submerge the incision under water.   Constipation Prevention   Complete by:  As directed    Drink plenty of fluids.  Prune juice may be helpful.  You may use a stool softener, such as Colace (over the counter) 100 mg twice a day.  Use MiraLax (over the counter) for constipation as needed.   Diet - low sodium heart healthy   Complete by:  As directed    Discharge instructions   Complete by:  As directed    Take Xarelto for two and a half more weeks, then discontinue Xarelto. Once the patient has completed the Xarelto, they may resume the 81 mg Aspirin.   Pick up stool softner and laxative for home use following surgery while on pain medications. Do not submerge incision under water. Please use good hand washing techniques while changing dressing each day. May shower starting three days after surgery. Please use a clean towel to pat the incision dry following showers. Continue to use ice for pain and swelling after surgery. Do not use any lotions or creams on the incision until instructed by your surgeon.  Wear both TED hose on both legs  during the day every day for three weeks, but may remove the TED hose at night at home.  Postoperative Constipation Protocol  Constipation - defined medically as fewer than three stools per week and severe constipation as less than one stool per week.  One of the most common issues patients have following surgery is constipation.   Even if you have a regular bowel pattern at home, your normal regimen is likely to be disrupted due to multiple reasons following surgery.  Combination of anesthesia, postoperative narcotics, change in appetite and fluid intake all can affect your bowels.  In order to avoid complications following surgery, here are some recommendations in order to help you during your recovery period.  Colace (docusate) - Pick up an over-the-counter form of Colace or another stool softener and take twice a day as long as you are requiring postoperative pain medications.  Take with a full glass of water daily.  If you experience loose stools or diarrhea, hold the colace until you stool forms back up.  If your symptoms do not get better within 1 week or if they get worse, check with your doctor.  Dulcolax (bisacodyl) - Pick up over-the-counter and take as directed by the product packaging as needed to assist with the movement of your bowels.  Take with a full glass of water.  Use this product as needed if not relieved by Colace only.   MiraLax (polyethylene glycol) - Pick up over-the-counter to have on hand.  MiraLax is a solution that will increase the amount of water in your bowels to assist with bowel movements.  Take as directed and can mix with a glass of water, juice, soda, coffee, or tea.  Take if you go more than two days without a movement. Do not use MiraLax more than once per day. Call your doctor if you are still constipated or irregular after using this medication for 7 days in a row.  If you continue to have problems with postoperative constipation, please contact the office for further assistance and recommendations.  If you experience "the worst abdominal pain ever" or develop nausea or vomiting, please contact the office immediatly for further recommendations for treatment.   Do not put a pillow under the knee. Place it under the heel.   Complete by:  As directed    Do not sit on low chairs, stoools or  toilet seats, as it may be difficult to get up from low surfaces   Complete by:  As directed    Driving restrictions   Complete by:  As directed    No driving until released by the physician.   Increase activity slowly as tolerated   Complete by:  As directed    Lifting restrictions   Complete by:  As directed    No lifting until released by the physician.   Patient may shower   Complete by:  As directed    You may shower without a dressing once there is no drainage.  Do not wash over the wound.  If drainage remains, do not shower until drainage stops.   TED hose   Complete by:  As directed    Use stockings (TED hose) for 3 weeks on both leg(s).  You may remove them at night for sleeping.   Weight bearing as tolerated   Complete by:  As directed    Laterality:  right   Extremity:  Lower     Allergies as of 11/10/2017   No Known Allergies  Medication List    STOP taking these medications   aspirin EC 81 MG tablet   ibuprofen 200 MG tablet Commonly known as:  ADVIL,MOTRIN   VITAMIN C GUMMIE PO     TAKE these medications   atorvastatin 20 MG tablet Commonly known as:  LIPITOR Take 20 mg by mouth daily.   loratadine 10 MG tablet Commonly known as:  CLARITIN Take 10 mg by mouth daily as needed for allergies.   methocarbamol 500 MG tablet Commonly known as:  ROBAXIN Take 1 tablet (500 mg total) by mouth every 6 (six) hours as needed for muscle spasms.   oxyCODONE 5 MG immediate release tablet Commonly known as:  Oxy IR/ROXICODONE Take 1-2 tablets (5-10 mg total) by mouth every 4 (four) hours as needed for moderate pain or severe pain.   rivaroxaban 10 MG Tabs tablet Commonly known as:  XARELTO Take 1 tablet (10 mg total) by mouth daily with breakfast. Take Xarelto for two and a half more weeks following discharge from the hospital, then discontinue Xarelto. Once the patient has completed the Xarelto, they may resume the 81 mg Aspirin.   SYSTANE BALANCE OP Place  1 drop into both eyes every morning.   tamoxifen 20 MG tablet Commonly known as:  NOLVADEX TAKE 1 TABLET BY MOUTH ONCE DAILY   traMADol 50 MG tablet Commonly known as:  ULTRAM Take 1-2 tablets (50-100 mg total) by mouth every 6 (six) hours as needed (mild pain).            Discharge Care Instructions  (From admission, onward)        Start     Ordered   11/10/17 0000  Weight bearing as tolerated    Question Answer Comment  Laterality right   Extremity Lower      11/10/17 0934   11/10/17 0000  Change dressing    Comments:  Change dressing daily with sterile 4 x 4 inch gauze dressing and apply TED hose. Do not submerge the incision under water.   11/10/17 1537     Follow-up Information    Gaynelle Arabian, MD. Schedule an appointment as soon as possible for a visit on 11/24/2017.   Specialty:  Orthopedic Surgery Contact information: [address] [place] [address] [phone]           Signed: Arlee Muslim, PA-C Orthopaedic Surgery 11/24/2017, 5:36 PM

## 2017-11-10 NOTE — Discharge Instructions (Signed)
° °Dr. Frank Aluisio °Total Joint Specialist °[facility] °[address] °[place], [place] [address] °[phone] ° °TOTAL KNEE REPLACEMENT POSTOPERATIVE DIRECTIONS ° °Knee Rehabilitation, Guidelines Following Surgery  °Results after knee surgery are often greatly improved when you follow the exercise, range of motion and muscle strengthening exercises prescribed by your doctor. Safety measures are also important to protect the knee from further injury. Any time any of these exercises cause you to have increased pain or swelling in your knee joint, decrease the amount until you are comfortable again and slowly increase them. If you have problems or questions, call your caregiver or physical therapist for advice.  ° °HOME CARE INSTRUCTIONS  °Remove items at home which could result in a fall. This includes throw rugs or furniture in walking pathways.  °· ICE to the affected knee every three hours for 30 minutes at a time and then as needed for pain and swelling.  Continue to use ice on the knee for pain and swelling from surgery. You may notice swelling that will progress down to the foot and ankle.  This is normal after surgery.  Elevate the leg when you are not up walking on it.   °· Continue to use the breathing machine which will help keep your temperature down.  It is common for your temperature to cycle up and down following surgery, especially at night when you are not up moving around and exerting yourself.  The breathing machine keeps your lungs expanded and your temperature down. °· Do not place pillow under knee, focus on keeping the knee straight while resting ° °DIET °You may resume your previous home diet once your are discharged from the hospital. ° °DRESSING / WOUND CARE / SHOWERING °You may shower 3 days after surgery, but keep the wounds dry during showering.  You may use an occlusive plastic wrap (Press'n Seal for example), NO SOAKING/SUBMERGING IN THE BATHTUB.  If the bandage gets  wet, change with a clean dry gauze.  If the incision gets wet, pat the wound dry with a clean towel. °You may start showering once you are discharged home but do not submerge the incision under water. Just pat the incision dry and apply a dry gauze dressing on daily. °Change the surgical dressing daily and reapply a dry dressing each time. ° °ACTIVITY °Walk with your walker as instructed. °Use walker as long as suggested by your caregivers. °Avoid periods of inactivity such as sitting longer than an hour when not asleep. This helps prevent blood clots.  °You may resume a sexual relationship in one month or when given the OK by your doctor.  °You may return to work once you are cleared by your doctor.  °Do not drive a car for 6 weeks or until released by you surgeon.  °Do not drive while taking narcotics. ° °WEIGHT BEARING °Weight bearing as tolerated with assist device (walker, cane, etc) as directed, use it as long as suggested by your surgeon or therapist, typically at least 4-6 weeks. ° °POSTOPERATIVE CONSTIPATION PROTOCOL °Constipation - defined medically as fewer than three stools per week and severe constipation as less than one stool per week. ° °One of the most common issues patients have following surgery is constipation.  Even if you have a regular bowel pattern at home, your normal regimen is likely to be disrupted due to multiple reasons following surgery.  Combination of anesthesia, postoperative narcotics, change in appetite and fluid intake all can affect your bowels.    In order to avoid complications following surgery, here are some recommendations in order to help you during your recovery period. ° °Colace (docusate) - Pick up an over-the-counter form of Colace or another stool softener and take twice a day as long as you are requiring postoperative pain medications.  Take with a full glass of water daily.  If you experience loose stools or diarrhea, hold the colace until you stool forms back up.  If  your symptoms do not get better within 1 week or if they get worse, check with your doctor. ° °Dulcolax (bisacodyl) - Pick up over-the-counter and take as directed by the product packaging as needed to assist with the movement of your bowels.  Take with a full glass of water.  Use this product as needed if not relieved by Colace only.  ° °MiraLax (polyethylene glycol) - Pick up over-the-counter to have on hand.  MiraLax is a solution that will increase the amount of water in your bowels to assist with bowel movements.  Take as directed and can mix with a glass of water, juice, soda, coffee, or tea.  Take if you go more than two days without a movement. °Do not use MiraLax more than once per day. Call your doctor if you are still constipated or irregular after using this medication for 7 days in a row. ° °If you continue to have problems with postoperative constipation, please contact the office for further assistance and recommendations.  If you experience "the worst abdominal pain ever" or develop nausea or vomiting, please contact the office immediatly for further recommendations for treatment. ° °ITCHING ° If you experience itching with your medications, try taking only a single pain pill, or even half a pain pill at a time.  You can also use Benadryl over the counter for itching or also to help with sleep.  ° °TED HOSE STOCKINGS °Wear the elastic stockings on both legs for three weeks following surgery during the day but you may remove then at night for sleeping. ° °MEDICATIONS °See your medication summary on the “After Visit Summary” that the nursing staff will review with you prior to discharge.  You may have some home medications which will be placed on hold until you complete the course of blood thinner medication.  It is important for you to complete the blood thinner medication as prescribed by your surgeon.  Continue your approved medications as instructed at time of discharge. ° °PRECAUTIONS °If you  experience chest pain or shortness of breath - call 911 immediately for transfer to the hospital emergency department.  °If you develop a fever greater that 101 F, purulent drainage from wound, increased redness or drainage from wound, foul odor from the wound/dressing, or calf pain - CONTACT YOUR SURGEON.   °                                                °FOLLOW-UP APPOINTMENTS °Make sure you keep all of your appointments after your operation with your surgeon and caregivers. You should call the office at the above phone number and make an appointment for approximately two weeks after the date of your surgery or on the date instructed by your surgeon outlined in the "After Visit Summary". ° ° °RANGE OF MOTION AND STRENGTHENING EXERCISES  °Rehabilitation of the knee is important following a knee injury or   an operation. After just a few days of immobilization, the muscles of the thigh which control the knee become weakened and shrink (atrophy). Knee exercises are designed to build up the tone and strength of the thigh muscles and to improve knee motion. Often times heat used for twenty to thirty minutes before working out will loosen up your tissues and help with improving the range of motion but do not use heat for the first two weeks following surgery. These exercises can be done on a training (exercise) mat, on the floor, on a table or on a bed. Use what ever works the best and is most comfortable for you Knee exercises include:  °Leg Lifts - While your knee is still immobilized in a splint or cast, you can do straight leg raises. Lift the leg to 60 degrees, hold for 3 sec, and slowly lower the leg. Repeat 10-20 times 2-3 times daily. Perform this exercise against resistance later as your knee gets better.  °Quad and Hamstring Sets - Tighten up the muscle on the front of the thigh (Quad) and hold for 5-10 sec. Repeat this 10-20 times hourly. Hamstring sets are done by pushing the foot backward against an object  and holding for 5-10 sec. Repeat as with quad sets.  °· Leg Slides: Lying on your back, slowly slide your foot toward your buttocks, bending your knee up off the floor (only go as far as is comfortable). Then slowly slide your foot back down until your leg is flat on the floor again. °· Angel Wings: Lying on your back spread your legs to the side as far apart as you can without causing discomfort.  °A rehabilitation program following serious knee injuries can speed recovery and prevent re-injury in the future due to weakened muscles. Contact your doctor or a physical therapist for more information on knee rehabilitation.  ° °IF YOU ARE TRANSFERRED TO A SKILLED REHAB FACILITY °If the patient is transferred to a skilled rehab facility following release from the hospital, a list of the current medications will be sent to the facility for the patient to continue.  When discharged from the skilled rehab facility, please have the facility set up the patient's Home Health Physical Therapy prior to being released. Also, the skilled facility will be responsible for providing the patient with their medications at time of release from the facility to include their pain medication, the muscle relaxants, and their blood thinner medication. If the patient is still at the rehab facility at time of the two week follow up appointment, the skilled rehab facility will also need to assist the patient in arranging follow up appointment in our office and any transportation needs. ° °MAKE SURE YOU:  °Understand these instructions.  °Get help right away if you are not doing well or get worse.  ° ° °Pick up stool softner and laxative for home use following surgery while on pain medications. °Do not submerge incision under water. °Please use good hand washing techniques while changing dressing each day. °May shower starting three days after surgery. °Please use a clean towel to pat the incision dry following showers. °Continue to use ice for  pain and swelling after surgery. °Do not use any lotions or creams on the incision until instructed by your surgeon. ° °Take Xarelto for two and a half more weeks following discharge from the hospital, then discontinue Xarelto. °Once the patient has completed the Xarelto, they may resume the 81 mg Aspirin. ° ° ° °Information on   my medicine - XARELTO® (Rivaroxaban) ° °Why was Xarelto® prescribed for you? °Xarelto® was prescribed for you to reduce the risk of blood clots forming after orthopedic surgery. The medical term for these abnormal blood clots is venous thromboembolism (VTE). ° °What do you need to know about xarelto® ? °Take your Xarelto® ONCE DAILY at the same time every day. °You may take it either with or without food. ° °If you have difficulty swallowing the tablet whole, you may crush it and mix in applesauce just prior to taking your dose. ° °Take Xarelto® exactly as prescribed by your doctor and DO NOT stop taking Xarelto® without talking to the doctor who prescribed the medication.  Stopping without other VTE prevention medication to take the place of Xarelto® may increase your risk of developing a clot. ° °After discharge, you should have regular check-up appointments with your healthcare provider that is prescribing your Xarelto®.   ° °What do you do if you miss a dose? °If you miss a dose, take it as soon as you remember on the same day then continue your regularly scheduled once daily regimen the next day. Do not take two doses of Xarelto® on the same day.  ° °Important Safety Information °A possible side effect of Xarelto® is bleeding. You should call your healthcare provider right away if you experience any of the following: °? Bleeding from an injury or your nose that does not stop. °? Unusual colored urine (red or dark brown) or unusual colored stools (red or black). °? Unusual bruising for unknown reasons. °? A serious fall or if you hit your head (even if there is no bleeding). ° °Some  medicines may interact with Xarelto® and might increase your risk of bleeding while on Xarelto®. To help avoid this, consult your healthcare provider or pharmacist prior to using any new prescription or non-prescription medications, including herbals, vitamins, non-steroidal anti-inflammatory drugs (NSAIDs) and supplements. ° °This website has more information on Xarelto®: www.xarelto.com. ° ° °

## 2017-11-10 NOTE — Progress Notes (Signed)
Physical Therapy Treatment Patient Details Name: Melissa Holloway MRN: [id number] DOB: 1945/11/11 Today's Date: 11/10/2017    History of Present Illness Pt is a 72 year old female s/p R TKA with hx of L TKA and L breast cancer    PT Comments    Pt requesting to use bathroom upon entering room, so assisted pt to bathroom and no dizziness reported.  Pt then assisted with ambulating in hallway and performed LE exercises.  Pt provided with HEP handout.  Pt reports possible d/c home later today if doing well so will return to practice safe stair technique.   Follow Up Recommendations  Follow surgeon's recommendation for DC plan and follow-up therapies;Outpatient PT     Equipment Recommendations  None recommended by PT    Recommendations for Other Services       Precautions / Restrictions Precautions Precautions: Knee Required Braces or Orthoses: Knee Immobilizer - Right Restrictions Other Position/Activity Restrictions: WBAT    Mobility  Bed Mobility Overal bed mobility: Needs Assistance Bed Mobility: Supine to Sit     Supine to sit: Min guard     General bed mobility comments: verbal cues for technique  Transfers Overall transfer level: Needs assistance Equipment used: Rolling walker (2 wheeled) Transfers: Sit to/from Stand Sit to Stand: Min guard         General transfer comment: verbal cues for UE and LE positioning  Ambulation/Gait Ambulation/Gait assistance: Min guard Ambulation Distance (Feet): 60 Feet Assistive device: Rolling walker (2 wheeled) Gait Pattern/deviations: Step-to pattern;Decreased stance time - right;Antalgic     General Gait Details: verbal cues for sequence, RW positioning, step length, heel strike   Stairs            Wheelchair Mobility    Modified Rankin (Stroke Patients Only)       Balance                                            Cognition Arousal/Alertness: Awake/alert Behavior During Therapy: WFL  for tasks assessed/performed Overall Cognitive Status: Within Functional Limits for tasks assessed                                        Exercises Total Joint Exercises Ankle Circles/Pumps: AROM;10 reps;Both Quad Sets: AROM;10 reps;Left Short Arc Quad: AROM;10 reps;Left Heel Slides: 10 reps;Left;AAROM Hip ABduction/ADduction: Left;AROM;10 reps Straight Leg Raises: AAROM;10 reps;Left Goniometric ROM: approximately 55* AAROM L knee flexion    General Comments        Pertinent Vitals/Pain Pain Assessment: 0-10 Pain Score: 5  Pain Location: R knee Pain Descriptors / Indicators: Sore;Aching Pain Intervention(s): Limited activity within patient's tolerance;Repositioned;Monitored during session;Ice applied    Home Living                      Prior Function            PT Goals (current goals can now be found in the care plan section) Progress towards PT goals: Progressing toward goals    Frequency    7X/week      PT Plan Current plan remains appropriate    Co-evaluation              AM-PAC PT "6 Clicks" Daily Activity  Outcome Measure  Difficulty  turning over in bed (including adjusting bedclothes, sheets and blankets)?: A Little Difficulty moving from lying on back to sitting on the side of the bed? : A Little Difficulty sitting down on and standing up from a chair with arms (e.g., wheelchair, bedside commode, etc,.)?: A Little Help needed moving to and from a bed to chair (including a wheelchair)?: A Little Help needed walking in hospital room?: A Little Help needed climbing 3-5 steps with a railing? : A Little 6 Click Score: 18    End of Session Equipment Utilized During Treatment: Gait belt Activity Tolerance: Patient tolerated treatment well Patient left: in chair;with call bell/phone within reach;with family/visitor present Nurse Communication: Mobility status PT Visit Diagnosis: Other abnormalities of gait and mobility  (R26.89)     Time: 5277-8242 PT Time Calculation (min) (ACUTE ONLY): 26 min  Charges:  $Gait Training: 8-22 mins $Therapeutic Exercise: 8-22 mins                    G Codes:       Carmelia Bake, PT, DPT 11/10/2017 Pager: [phone]  York Ram E 11/10/2017, 12:35 PM

## 2017-11-10 NOTE — Care Management CC44 (Signed)
Condition Code 44 Documentation Completed  Patient Details  Name: TRANIECE BOFFA MRN: [id number] Date of Birth: Oct 21, 1945   Condition Code 44 given:  Yes Patient signature on Condition Code 44 notice:  Yes Documentation of 2 MD's agreement:  Yes Code 44 added to claim:  Yes    Guadalupe Maple, RN 11/10/2017, 4:23 PM

## 2017-11-10 NOTE — Care Management Obs Status (Signed)
[facility] NOTIFICATION   Patient Details  Name: Melissa Holloway MRN: [id number] Date of Birth: 1946-01-29   Medicare Observation Status Notification Given:  Yes    Guadalupe Maple, RN 11/10/2017, 4:22 PM

## 2017-11-10 NOTE — Progress Notes (Signed)
Physical Therapy Treatment Patient Details Name: Melissa Holloway MRN: [id number] DOB: Mar 02, 1946 Today's Date: 11/10/2017    History of Present Illness Pt is a 72 year old female s/p R TKA with hx of L TKA and L breast cancer    PT Comments    Pt ambulated in hallway and practiced safe stair technique with spouse assisting with RW.  Pt feels ready for d/c home today.  Provided HEP handout, and pt and spouse had no further questions.   Follow Up Recommendations  Follow surgeon's recommendation for DC plan and follow-up therapies;Outpatient PT     Equipment Recommendations  None recommended by PT    Recommendations for Other Services       Precautions / Restrictions Precautions Precautions: Knee Required Braces or Orthoses: Knee Immobilizer - Right Restrictions Other Position/Activity Restrictions: WBAT    Mobility  Bed Mobility Overal bed mobility: Needs Assistance Bed Mobility: Supine to Sit     Supine to sit: Min guard     General bed mobility comments: verbal cues for technique  Transfers Overall transfer level: Needs assistance Equipment used: Rolling walker (2 wheeled) Transfers: Sit to/from Stand Sit to Stand: Min guard;Supervision         General transfer comment: verbal cues for UE and LE positioning  Ambulation/Gait Ambulation/Gait assistance: Min guard;Supervision Ambulation Distance (Feet): 120 Feet Assistive device: Rolling walker (2 wheeled) Gait Pattern/deviations: Step-to pattern;Decreased stance time - right;Antalgic     General Gait Details: verbal cues for sequence, RW positioning, step length, heel strike   Stairs Stairs: Yes   Stair Management: Step to pattern;Backwards;With walker Number of Stairs: 2 General stair comments: verbal cues for safe technique, sequence, RW positioning, spouse assisted with holding RW, pt and spouse report understanding  Wheelchair Mobility    Modified Rankin (Stroke Patients Only)       Balance                                             Cognition Arousal/Alertness: Awake/alert Behavior During Therapy: WFL for tasks assessed/performed Overall Cognitive Status: Within Functional Limits for tasks assessed                                        Exercises     General Comments        Pertinent Vitals/Pain Pain Assessment: 0-10 Pain Score: 5  Pain Location: R knee Pain Descriptors / Indicators: Sore;Aching Pain Intervention(s): Limited activity within patient's tolerance;Repositioned;Monitored during session    Home Living                      Prior Function            PT Goals (current goals can now be found in the care plan section) Progress towards PT goals: Progressing toward goals    Frequency    7X/week      PT Plan Current plan remains appropriate    Co-evaluation              AM-PAC PT "6 Clicks" Daily Activity  Outcome Measure  Difficulty turning over in bed (including adjusting bedclothes, sheets and blankets)?: A Little Difficulty moving from lying on back to sitting on the side of the bed? : A Little Difficulty sitting down  on and standing up from a chair with arms (Holloway.g., wheelchair, bedside commode, etc,.)?: A Little Help needed moving to and from a bed to chair (including a wheelchair)?: A Little Help needed walking in hospital room?: A Little Help needed climbing 3-5 steps with a railing? : A Little 6 Click Score: 18    End of Session Equipment Utilized During Treatment: Gait belt Activity Tolerance: Patient tolerated treatment well Patient left: in chair;with call bell/phone within reach;with family/visitor present Nurse Communication: Mobility status PT Visit Diagnosis: Other abnormalities of gait and mobility (R26.89)     Time: 1350-1402 PT Time Calculation (min) (ACUTE ONLY): 12 min  Charges:  $Gait Training: 8-22 mins                    G Codes:      Melissa Holloway, PT,  DPT 11/10/2017 Pager: [phone]    Melissa Holloway 11/10/2017, 2:17 PM

## 2017-11-10 NOTE — Care Management CC44 (Signed)
Condition Code 44 Documentation Completed  Patient Details  Name: Melissa Holloway MRN: [id number] Date of Birth: September 10, 1945   Condition Code 44 given:    Patient signature on Condition Code 44 notice:    Documentation of 2 MD's agreement:  yes Code 44 added to claim:       Rolland Porter, RN 11/10/2017, 4:10 PM

## 2017-11-10 NOTE — Progress Notes (Signed)
Pt ambulated to bathroom with CNA, became dizzy and light-headed. Bp dropped to 81/47. Pt got back in bed and bp lying was 104/50.  Pt states no SOB or chest pain, minimal pain, but pain became worse when ambulating.   PA Dian Situ made aware, 51mL bolus ordered. RN will monitor.

## 2017-11-13 DIAGNOSIS — M25661 Stiffness of right knee, not elsewhere classified: Secondary | ICD-10-CM | POA: Diagnosis not present

## 2017-11-16 DIAGNOSIS — M25661 Stiffness of right knee, not elsewhere classified: Secondary | ICD-10-CM | POA: Diagnosis not present

## 2017-11-19 DIAGNOSIS — M25661 Stiffness of right knee, not elsewhere classified: Secondary | ICD-10-CM | POA: Diagnosis not present

## 2017-11-23 DIAGNOSIS — M25661 Stiffness of right knee, not elsewhere classified: Secondary | ICD-10-CM | POA: Diagnosis not present

## 2017-11-25 DIAGNOSIS — M25661 Stiffness of right knee, not elsewhere classified: Secondary | ICD-10-CM | POA: Diagnosis not present

## 2017-11-25 NOTE — Progress Notes (Signed)
[place]  [phone] [phone]     ID: Melissa Holloway DOB: 12/10/1945  MR#: [id number]  HYQ#:[id number]  Patient Care Team: Jonathon Jordan, MD as PCP - General (Family Medicine) Magrinat, Virgie Dad, MD as Consulting Physician (Oncology) Fanny Skates, MD as Consulting Physician (General Surgery) Arloa Koh, MD as Consulting Physician (Radiation Oncology) Sylvan Cheese, NP as Nurse Practitioner (Hematology and Oncology) PCP: Jonathon Jordan, MD GYN: SU: Fanny Skates MD OTHER MD: Frederik Pear MD, Lavonna Monarch MD  CHIEF COMPLAINT:  estrogen receptor positive breast cancer  CURRENT TREATMENT: Tamoxifen   BREAST CANCER HISTORY: From the initial intake note:  Melissa Holloway had routine screening mammography with tomography [facility]. This showed a possible mass in the left breast. On 08/16/2014 left diagnostic mammography and ultrasonography found the breast density to be category B. In the upper inner quadrant of the left breast there was a 7 mm spiculated mass which was not palpable by exam. Ultrasound confirmed a 5 mm irregular to her than wide hypoechoic mass in the area in question. There was no left axillary adenopathy.  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  On 09/08/2014 the patient underwent bilateral breast MRI. This confirmed the biopsy-proven malignancy in the upper inner left breast and measured it at 0.8 cm. There was also an irregular enhancing mass in the lower outer left breast measuring 1 cm maximally. Also in the right breast there was an irregular enhancing mass in the lower outer quadrant measuring 0.6 cm. There  was no abnormal adenopathy in either axilla and no internal mammary adenopathy. There were incidental cysts noted in the liver, previously imaged by ultrasound in January 2014.  Ultrasound of both breasts on 09/14/2014 failed to identify the additionalsuspicious areas seen on MRI.accordingly the patient proceeded to bilateral MRI guided biopsies on 09/27/2014. In the right breast lower outer quadrant there was a fibroadenoma with no atypia or malignancy. In the left breast central biopsy there was atypical ductal and atypical lobular hyperplasia.  Her subsequent history is as detailed below  INTERVAL HISTORY: Melissa Holloway returns today for follow-up and treatment of her estrogen receptor positive breast cancer. She continues on tamoxifen, with good tolerance. She may have occasional "head sweats". She denies issues with increased vaginal discharge.   Since her last visit, she underwent diagnostic bilateral mammography with CAD and tomography on 10/09/2017 at [facility] showing: breast density category B. There was no evidence of malignancy.    REVIEW OF SYSTEMS: Melissa Holloway reports that she had right knee replacement two weeks ago under Dr. Maureen Ralphs. She is now walking with a cane. She found out that she was allergic to Xarelto due to having a rash. She takes tramadol as needed for pain. She denies unusual headaches, visual changes, nausea, vomiting, or dizziness. There has been no unusual cough, phlegm production, or pleurisy. This been no change in bowel or bladder habits. She denies unexplained fatigue or unexplained weight loss, bleeding, rash, or fever. A detailed review of systems was otherwise stable.   PAST MEDICAL HISTORY: Past Medical History:  Diagnosis Date  . Arthritis    right knee; right pinky finger  . Breast cancer, left breast ([place]) 08/23/2014  . Esophageal  hernia    s/p  . Family history of breast cancer   . Personal history of radiation therapy   . PONV (postoperative nausea and  vomiting)   . S/P radiation therapy 01/10/2015 through 02/21/2015    Left breast 5000 cGy 25 sessions, left breast boost 1000 cGy 5 sessions      PAST SURGICAL HISTORY: Past Surgical History:  Procedure Laterality Date  . BREAST LUMPECTOMY Left   . COLONOSCOPY    . esophageal hernia  2010  . RADIOACTIVE SEED GUIDED PARTIAL MASTECTOMY WITH AXILLARY SENTINEL LYMPH NODE BIOPSY Left 10/18/2014   Procedure: LEFT PARTIAL MASTECTOMY AND 1 LEFT RADIOACTIVE SEED 1 NEEDLE LOCALIZATION LEFT AXILLARY SENTINAL NODE BIOPSY;  Surgeon: Fanny Skates, MD;  Location: [facility];  Service: General;  Laterality: Left;  . RE-EXCISION OF BREAST LUMPECTOMY Left 11/27/2014   Procedure: LEFT PARTIAL LUMPECTOMY RE-EXCISION LATERAL AND POSTERIOR MARGINS;  Surgeon: Fanny Skates, MD;  Location: [facility];  Service: General;  Laterality: Left;  . TONSILLECTOMY    . TOTAL KNEE ARTHROPLASTY Left 01/09/2014   Procedure: LEFT TOTAL KNEE ARTHROPLASTY;  Surgeon: Kerin Salen, MD;  Location: [facility];  Service: Orthopedics;  Laterality: Left;  . TOTAL KNEE ARTHROPLASTY Right 11/09/2017   Procedure: RIGHT TOTAL KNEE ARTHROPLASTY;  Surgeon: Gaynelle Arabian, MD;  Location: [facility];  Service: Orthopedics;  Laterality: Right;    FAMILY HISTORY Family History  Problem Relation Age of Onset  . Breast cancer Mother 19       TAH/BSO; deceased 38  . Prostate cancer Brother 46       deceased 50  . Breast cancer Maternal Aunt 70       deceased 10  . Breast cancer Paternal Aunt 68       Currently 101  . Cancer Paternal Grandmother        unknown type; deceased 92s  the patient's father died at the age of 87 following a fall. The patient's mother was diagnosed with breast cancer around age 24. She died with Alzheimer's disease around age 82. The patient had 4 brothers, 3 sisters. One brother was diagnosed with prostate cancer at the age  of 45. In addition a paternal aunt was diagnosed with breast cancer at age 6. She is not 71 years old. She has 6 daughters mostly in their 59s none of whom have had breast cancer. There is no history of ovarian cancer in the family.  GYNECOLOGIC HISTORY:  No LMP recorded. Patient is postmenopausal. Menarche age 4, first live birth age 47. The patient is GX P1. The patient went through menopause around 2008. She did not take hormone replacement. She did take oral contraceptives remotely for more than a year with no complications  SOCIAL HISTORY:  Melissa Holloway is retired from [employer]. Her husband [name]") is a retired Administrator. Their son [name] lives in [place] and works in Press photographer. The patient has one 61 year old-old granddaughter and a grandson born June 2016    ADVANCED DIRECTIVES: not in place   HEALTH MAINTENANCE: Social History   Tobacco Use  . Smoking status: Never Smoker  . Smokeless tobacco: Never Used  Substance Use Topics  . Alcohol use: No  . Drug use: No     Colonoscopy:  PAP:  Bone density:  Lipid panel:  No Known Allergies  Current Outpatient Medications  Medication Sig Dispense Refill  . atorvastatin (LIPITOR) 20 MG tablet Take 20 mg by mouth daily.    Marland Kitchen  loratadine (CLARITIN) 10 MG tablet Take 10 mg by mouth daily as needed for allergies.    . methocarbamol (ROBAXIN) 500 MG tablet Take 1 tablet (500 mg total) by mouth every 6 (six) hours as needed for muscle spasms. 80 tablet 0  . oxyCODONE (OXY IR/ROXICODONE) 5 MG immediate release tablet Take 1-2 tablets (5-10 mg total) by mouth every 4 (four) hours as needed for moderate pain or severe pain. 80 tablet 0  . Propylene Glycol (SYSTANE BALANCE OP) Place 1 drop into both eyes every morning.    . rivaroxaban (XARELTO) 10 MG TABS tablet Take 1 tablet (10 mg total) by mouth daily with breakfast. Take Xarelto for two and a half more weeks following discharge from the hospital, then discontinue Xarelto. Once the  patient has completed the Xarelto, they may resume the 81 mg Aspirin. 20 tablet 0  . tamoxifen (NOLVADEX) 20 MG tablet TAKE 1 TABLET BY MOUTH ONCE DAILY 90 tablet 1  . traMADol (ULTRAM) 50 MG tablet Take 1-2 tablets (50-100 mg total) by mouth every 6 (six) hours as needed (mild pain). 56 tablet 0   No current facility-administered medications for this visit.     OBJECTIVE: middle-aged white woman who appears stated age  1:   11/26/17 1412  BP: 132/62  Pulse: 80  Resp: 18  Temp: 98.6 F (37 C)  SpO2: 98%     Body mass index is 33.34 kg/m.    ECOG FS:0 - Asymptomatic  Sclerae unicteric, pupils round and equal Oropharynx clear and moist No cervical or supraclavicular adenopathy Lungs no rales or rhonchi Heart regular rate and rhythm Abd soft, nontender, positive bowel sounds MSK no focal spinal tenderness, no upper extremity lymphedema; that is post recent knee replacement, using a cane, but walking well, with good balance Neuro: nonfocal, well oriented, appropriate affect Breasts: The right breast is unremarkable.  The left breast has undergone lumpectomy followed by radiation.  There is no evidence of local recurrence.  Both axillae are benign.  LAB RESULTS:  CMP     Component Value Date/Time   NA 141 11/10/2017 0518   NA 143 10/23/2016 1025   K 3.9 11/10/2017 0518   K 4.7 10/23/2016 1025   CL 107 11/10/2017 0518   CO2 26 11/10/2017 0518   CO2 27 10/23/2016 1025   GLUCOSE 107 (H) 11/10/2017 0518   GLUCOSE 97 10/23/2016 1025   BUN 8 11/10/2017 0518   BUN 14.1 10/23/2016 1025   CREATININE 0.63 11/10/2017 0518   CREATININE 0.8 10/23/2016 1025   CALCIUM 8.7 (L) 11/10/2017 0518   CALCIUM 10.4 10/23/2016 1025   PROT 7.0 11/02/2017 0934   PROT 7.4 10/23/2016 1025   ALBUMIN 4.1 11/02/2017 0934   ALBUMIN 4.2 10/23/2016 1025   AST 27 11/02/2017 0934   AST 23 10/23/2016 1025   ALT 23 11/02/2017 0934   ALT 25 10/23/2016 1025   ALKPHOS 53 11/02/2017 0934   ALKPHOS  121 10/23/2016 1025   BILITOT 0.7 11/02/2017 0934   BILITOT 0.81 10/23/2016 1025   GFRNONAA >60 11/10/2017 0518   GFRAA >60 11/10/2017 0518    INo results found for: SPEP, UPEP  Lab Results  Component Value Date   WBC 3.1 (L) 11/26/2017   NEUTROABS 1.9 11/26/2017   HGB 10.7 (L) 11/26/2017   HCT 32.8 (L) 11/26/2017   MCV 86.8 11/26/2017   PLT 424 (H) 11/26/2017      Chemistry      Component Value Date/Time   NA  141 11/10/2017 0518   NA 143 10/23/2016 1025   K 3.9 11/10/2017 0518   K 4.7 10/23/2016 1025   CL 107 11/10/2017 0518   CO2 26 11/10/2017 0518   CO2 27 10/23/2016 1025   BUN 8 11/10/2017 0518   BUN 14.1 10/23/2016 1025   CREATININE 0.63 11/10/2017 0518   CREATININE 0.8 10/23/2016 1025      Component Value Date/Time   CALCIUM 8.7 (L) 11/10/2017 0518   CALCIUM 10.4 10/23/2016 1025   ALKPHOS 53 11/02/2017 0934   ALKPHOS 121 10/23/2016 1025   AST 27 11/02/2017 0934   AST 23 10/23/2016 1025   ALT 23 11/02/2017 0934   ALT 25 10/23/2016 1025   BILITOT 0.7 11/02/2017 0934   BILITOT 0.81 10/23/2016 1025       No results found for: LABCA2  No components found for: LABCA125  No results for input(s): INR in the last 168 hours.  Urinalysis    Component Value Date/Time   COLORURINE YELLOW 01/03/2014 [place] 01/03/2014 1042   LABSPEC 1.012 01/03/2014 1042   PHURINE 6.0 01/03/2014 1042   GLUCOSEU NEGATIVE 01/03/2014 1042   HGBUR NEGATIVE 01/03/2014 1042   BILIRUBINUR NEGATIVE 01/03/2014 1042   KETONESUR NEGATIVE 01/03/2014 1042   PROTEINUR NEGATIVE 01/03/2014 1042   UROBILINOGEN 0.2 01/03/2014 1042   NITRITE NEGATIVE 01/03/2014 1042   LEUKOCYTESUR TRACE (A) 01/03/2014 1042    STUDIES: Since her last visit, she underwent diagnostic bilateral mammography with CAD and tomography on 10/09/2017 at [facility] showing: breast density category B. There was no evidence of malignancy.    ASSESSMENT: 72 y.o. [place] woman status post  left breast upper inner quadrant biopsy 08/31/2014 for a clinical T1b N0, stage IA invasive ductal carcinoma, grade 1, strongly estrogen and progesterone receptor positive, HER-2 not amplified, with an MIB-1 of 10%  (1) MRI biopsy of 2 additional lesions showed  (a) in the left central breast, atypical lobular hyperplasia and atypical ductal hyperplasia  (b) in the right breast lower outer quadrant a fibroadenoma   (2) dual lumpectomy 10/18/2014 showed  (a) labeled "left upper inner quadrant", a pT1b pN0, stage IA invasive ductal carcinoma, grade 1, and again HER-2 negative  (b) labeled "left central", ductal carcinoma in situ, low-grade, with a positive lateral and other close margins   (3) additional surgery for margin clearance 11/27/2014 was successful, although the lateral margin remained close at 1 mm  (4) Oncotype DX score of 9 predicts an outside the breast risk of recurrence within 10 years of 6% if the patient's only systemic treatment is tamoxifen for 5 years. It also predicts no benefit from chemotherapy.   (5)  adjuvant radiation 01/10/2015 through 02/21/2015:   Left breast 5000 cGy 25 sessions, left breast boost 1000 cGy 5 sessions    (6) anastrozole started 03/23/2015  (a) bone density through [place] was normal 2016  (b) anastrozole held 10/30/2016 secondary to arthralgias/myalgias   (c) tamoxifen started 12/20/2016  PLAN:  Melissa Holloway is currently 3 years out from definitive surgery with no evidence of disease recurrence.  This is very favorable.  She is tolerating tamoxifen well and the plan will be to continue that through August 2021  She did very well with her knee surgery and next time she comes she will be able to go up and down the steps to visited the garden outside  She will call with any problems that may develop before the next visit.   Magrinat, Virgie Dad,  MD  11/26/17 2:28 PM Medical Oncology  and Hematology [facility] [address] [place], [place] [address] Tel. [phone]    Fax. [phone]  This document serves as a record of services personally performed by Lurline Del, MD. It was created on his behalf by Sheron Nightingale, a trained medical scribe. The creation of this record is based on the scribe's personal observations and the provider's statements to them.   I have reviewed the above documentation for accuracy and completeness, and I agree with the above.

## 2017-11-26 ENCOUNTER — Inpatient Hospital Stay (HOSPITAL_BASED_OUTPATIENT_CLINIC_OR_DEPARTMENT_OTHER): Payer: Medicare Other | Admitting: Oncology

## 2017-11-26 ENCOUNTER — Inpatient Hospital Stay: Payer: Medicare Other | Attending: Oncology

## 2017-11-26 VITALS — BP 132/62 | HR 80 | Temp 98.6°F | Resp 18 | Ht 63.0 in | Wt 188.2 lb

## 2017-11-26 DIAGNOSIS — C50212 Malignant neoplasm of upper-inner quadrant of left female breast: Secondary | ICD-10-CM

## 2017-11-26 DIAGNOSIS — Z170 Estrogen receptor positive status [ER+]: Secondary | ICD-10-CM | POA: Diagnosis not present

## 2017-11-26 LAB — CBC WITH DIFFERENTIAL/PLATELET
BASOS ABS: 0.1 10*3/uL (ref 0.0–0.1)
BASOS PCT: 2 %
EOS PCT: 2 %
Eosinophils Absolute: 0.1 10*3/uL (ref 0.0–0.5)
HCT: 32.8 % — ABNORMAL LOW (ref 34.8–46.6)
Hemoglobin: 10.7 g/dL — ABNORMAL LOW (ref 11.6–15.9)
Lymphocytes Relative: 26 %
Lymphs Abs: 0.8 10*3/uL — ABNORMAL LOW (ref 0.9–3.3)
MCH: 28.4 pg (ref 25.1–34.0)
MCHC: 32.7 g/dL (ref 31.5–36.0)
MCV: 86.8 fL (ref 79.5–101.0)
Monocytes Absolute: 0.3 10*3/uL (ref 0.1–0.9)
Monocytes Relative: 9 %
Neutro Abs: 1.9 10*3/uL (ref 1.5–6.5)
Neutrophils Relative %: 61 %
PLATELETS: 424 10*3/uL — AB (ref 145–400)
RBC: 3.78 MIL/uL (ref 3.70–5.45)
RDW: 14.2 % (ref 11.2–14.5)
WBC: 3.1 10*3/uL — AB (ref 3.9–10.3)

## 2017-11-26 LAB — COMPREHENSIVE METABOLIC PANEL
ALK PHOS: 75 U/L (ref 40–150)
ALT: 16 U/L (ref 0–55)
ANION GAP: 8 (ref 3–11)
AST: 18 U/L (ref 5–34)
Albumin: 3.4 g/dL — ABNORMAL LOW (ref 3.5–5.0)
BILIRUBIN TOTAL: 0.3 mg/dL (ref 0.2–1.2)
BUN: 9 mg/dL (ref 7–26)
CALCIUM: 9.5 mg/dL (ref 8.4–10.4)
CO2: 26 mmol/L (ref 22–29)
Chloride: 107 mmol/L (ref 98–109)
Creatinine, Ser: 0.73 mg/dL (ref 0.60–1.10)
GFR calc Af Amer: >60 mL/min (ref >60)
GLUCOSE: 90 mg/dL (ref 70–140)
Potassium: 4 mmol/L (ref 3.5–5.1)
Sodium: 141 mmol/L (ref 136–145)
TOTAL PROTEIN: 6.6 g/dL (ref 6.4–8.3)

## 2017-11-26 MED ORDER — TAMOXIFEN CITRATE 20 MG PO TABS: 20.0000 mg | ORAL_TABLET | Freq: Every day | ORAL | 3 refills | Status: DC

## 2017-11-27 DIAGNOSIS — M25661 Stiffness of right knee, not elsewhere classified: Secondary | ICD-10-CM | POA: Diagnosis not present

## 2017-11-30 DIAGNOSIS — M25661 Stiffness of right knee, not elsewhere classified: Secondary | ICD-10-CM | POA: Diagnosis not present

## 2017-12-04 DIAGNOSIS — M25661 Stiffness of right knee, not elsewhere classified: Secondary | ICD-10-CM | POA: Diagnosis not present

## 2017-12-07 DIAGNOSIS — M25661 Stiffness of right knee, not elsewhere classified: Secondary | ICD-10-CM | POA: Diagnosis not present

## 2017-12-11 DIAGNOSIS — M25661 Stiffness of right knee, not elsewhere classified: Secondary | ICD-10-CM | POA: Diagnosis not present

## 2017-12-15 DIAGNOSIS — Z471 Aftercare following joint replacement surgery: Secondary | ICD-10-CM | POA: Diagnosis not present

## 2017-12-15 DIAGNOSIS — Z96651 Presence of right artificial knee joint: Secondary | ICD-10-CM | POA: Diagnosis not present

## 2018-01-12 DIAGNOSIS — Z23 Encounter for immunization: Secondary | ICD-10-CM | POA: Diagnosis not present

## 2018-01-12 DIAGNOSIS — Z0000 Encounter for general adult medical examination without abnormal findings: Secondary | ICD-10-CM | POA: Diagnosis not present

## 2018-01-12 DIAGNOSIS — E7800 Pure hypercholesterolemia, unspecified: Secondary | ICD-10-CM | POA: Diagnosis not present

## 2018-02-02 DIAGNOSIS — H43391 Other vitreous opacities, right eye: Secondary | ICD-10-CM | POA: Diagnosis not present

## 2018-02-02 DIAGNOSIS — H04123 Dry eye syndrome of bilateral lacrimal glands: Secondary | ICD-10-CM | POA: Diagnosis not present

## 2018-02-02 DIAGNOSIS — H2513 Age-related nuclear cataract, bilateral: Secondary | ICD-10-CM | POA: Diagnosis not present

## 2018-03-01 DIAGNOSIS — E2839 Other primary ovarian failure: Secondary | ICD-10-CM | POA: Diagnosis not present

## 2018-08-16 DIAGNOSIS — Z0120 Encounter for dental examination and cleaning without abnormal findings: Secondary | ICD-10-CM | POA: Diagnosis not present

## 2018-09-22 ENCOUNTER — Other Ambulatory Visit: Payer: Self-pay | Admitting: Oncology

## 2018-09-22 DIAGNOSIS — Z853 Personal history of malignant neoplasm of breast: Secondary | ICD-10-CM

## 2018-10-11 ENCOUNTER — Ambulatory Visit
Admission: RE | Admit: 2018-10-11 | Discharge: 2018-10-11 | Disposition: A | Discharge status: Home / Self Care | Payer: Medicare Other | Source: Ambulatory Visit | Attending: Oncology | Admitting: Oncology

## 2018-10-11 DIAGNOSIS — R928 Other abnormal and inconclusive findings on diagnostic imaging of breast: Secondary | ICD-10-CM | POA: Diagnosis not present

## 2018-10-11 DIAGNOSIS — Z853 Personal history of malignant neoplasm of breast: Secondary | ICD-10-CM

## 2018-12-08 ENCOUNTER — Other Ambulatory Visit: Payer: Self-pay | Admitting: Oncology

## 2018-12-15 DIAGNOSIS — M7731 Calcaneal spur, right foot: Secondary | ICD-10-CM | POA: Diagnosis not present

## 2018-12-15 DIAGNOSIS — M71571 Other bursitis, not elsewhere classified, right ankle and foot: Secondary | ICD-10-CM | POA: Diagnosis not present

## 2018-12-15 DIAGNOSIS — M722 Plantar fascial fibromatosis: Secondary | ICD-10-CM | POA: Diagnosis not present

## 2018-12-22 DIAGNOSIS — M722 Plantar fascial fibromatosis: Secondary | ICD-10-CM | POA: Diagnosis not present

## 2018-12-22 DIAGNOSIS — M71571 Other bursitis, not elsewhere classified, right ankle and foot: Secondary | ICD-10-CM | POA: Diagnosis not present

## 2018-12-23 ENCOUNTER — Telehealth: Payer: Self-pay | Admitting: Oncology

## 2018-12-23 NOTE — Telephone Encounter (Signed)
Patient called inquiring about upcoming Webex appointment, test run complete and e-mail has been sent.

## 2018-12-24 ENCOUNTER — Telehealth: Payer: Self-pay | Admitting: Oncology

## 2018-12-24 NOTE — Telephone Encounter (Signed)
Contacted pt to verify webex visit for pre reg °

## 2018-12-28 ENCOUNTER — Other Ambulatory Visit: Payer: Medicare Other

## 2018-12-28 ENCOUNTER — Inpatient Hospital Stay: Payer: Medicare Other | Attending: Oncology | Admitting: Oncology

## 2018-12-28 DIAGNOSIS — Z8042 Family history of malignant neoplasm of prostate: Secondary | ICD-10-CM

## 2018-12-28 DIAGNOSIS — Z79899 Other long term (current) drug therapy: Secondary | ICD-10-CM

## 2018-12-28 DIAGNOSIS — Z170 Estrogen receptor positive status [ER+]: Secondary | ICD-10-CM

## 2018-12-28 DIAGNOSIS — Z923 Personal history of irradiation: Secondary | ICD-10-CM | POA: Diagnosis not present

## 2018-12-28 DIAGNOSIS — Z79810 Long term (current) use of selective estrogen receptor modulators (SERMs): Secondary | ICD-10-CM

## 2018-12-28 DIAGNOSIS — C50212 Malignant neoplasm of upper-inner quadrant of left female breast: Secondary | ICD-10-CM

## 2018-12-28 DIAGNOSIS — Z803 Family history of malignant neoplasm of breast: Secondary | ICD-10-CM

## 2018-12-28 NOTE — Progress Notes (Signed)
[place]  [phone] Fax:(336) [phone]     ID: Melissa Holloway DOB: [birth year]  MR#: [id number]  KDX#:[id number]  Patient Care Team: Jonathon Jordan, MD as PCP - General (Family Medicine) Venesa Semidey, Virgie Dad, MD as Consulting Physician (Oncology) Fanny Skates, MD as Consulting Physician (General Surgery) Arloa Koh, MD as Consulting Physician (Radiation Oncology) Sylvan Cheese, NP as Nurse Practitioner (Hematology and Oncology) OTHER MD: Frederik Pear MD, Lavonna Monarch MD  I connected with Melissa Holloway on 12/28/18 at  1:30 PM EDT by video enabled telemedicine visit and verified that I am speaking with the correct person using two identifiers.   I discussed the limitations, risks, security and privacy concerns of performing an evaluation and management service by telemedicine and the availability of in-person appointments. I also discussed with the patient that there may be a patient responsible charge related to this service. The patient expressed understanding and agreed to proceed.   Other persons participating in the visit and their role in the encounter: Wilburn Mylar, scribe   Patients location: home  Providers location: [place]:  estrogen receptor positive breast cancer  CURRENT TREATMENT: Tamoxifen   INTERVAL HISTORY: Melissa Holloway is seen today for follow-up and treatment of her estrogen receptor positive breast cancer.   She continues on tamoxifen, with good tolerance. She denies hot flashes and vaginal discharge.   Since her last visit, she underwent bilateral diagnostic mammography with tomography at [facility] on 10/11/2018 showing: breast density category B; no evidence of malignancy in either breast.   REVIEW OF SYSTEMS: Melissa Holloway reports doing very well overall. She is staying in; she's reading and watching movies. For exercise, she works out with tapes every morning. Her husband does  the shopping. The patient denies unusual headaches, visual changes, nausea, vomiting, stiff neck, dizziness, or gait imbalance. There has been no cough, phlegm production, or pleurisy, no chest pain or pressure, and no change in bowel or bladder habits. The patient denies fever, rash, bleeding, unexplained fatigue or unexplained weight loss. A detailed review of systems was otherwise entirely negative.   BREAST CANCER HISTORY: From the initial intake note:  Melissa Holloway had routine screening mammography with tomography [facility]. This showed a possible mass in the left breast. On 08/16/2014 left diagnostic mammography and ultrasonography found the breast density to be category B. In the upper inner quadrant of the left breast there was a 7 mm spiculated mass which was not palpable by exam. Ultrasound confirmed a 5 mm irregular to her than wide hypoechoic mass in the area in question. There was no left axillary adenopathy.  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  On 09/08/2014 the patient underwent bilateral breast MRI. This confirmed the biopsy-proven malignancy in the upper inner left breast and measured it at 0.8 cm. There was also an irregular enhancing mass in the lower outer left breast measuring 1 cm maximally. Also in the right breast there was an irregular enhancing mass in the lower outer quadrant measuring 0.6 cm. There was no abnormal adenopathy in either axilla and no internal mammary adenopathy. There were incidental cysts noted in the liver, previously imaged by ultrasound in January  2014.  Ultrasound of both breasts on 09/14/2014 failed to identify the additionalsuspicious areas seen on  MRI.accordingly the patient proceeded to bilateral MRI guided biopsies on 09/27/2014. In the right breast lower outer quadrant there was a fibroadenoma with no atypia or malignancy. In the left breast central biopsy there was atypical ductal and atypical lobular hyperplasia.  Her subsequent history is as detailed below   PAST MEDICAL HISTORY: Past Medical History:  Diagnosis Date   Arthritis    right knee; right pinky finger   Breast cancer, left breast ([place]) 08/23/2014   Esophageal hernia    s/p   Family history of breast cancer    Personal history of radiation therapy    PONV (postoperative nausea and vomiting)    S/P radiation therapy 01/10/2015 through 02/21/2015    Left breast 5000 cGy 25 sessions, left breast boost 1000 cGy 5 sessions      PAST SURGICAL HISTORY: Past Surgical History:  Procedure Laterality Date   BREAST LUMPECTOMY Left    COLONOSCOPY     esophageal hernia  2010   RADIOACTIVE SEED GUIDED PARTIAL MASTECTOMY WITH AXILLARY SENTINEL LYMPH NODE BIOPSY Left 10/18/2014   Procedure: LEFT PARTIAL MASTECTOMY AND 1 LEFT RADIOACTIVE SEED 1 NEEDLE LOCALIZATION LEFT AXILLARY SENTINAL NODE BIOPSY;  Surgeon: Fanny Skates, MD;  Location: [facility];  Service: General;  Laterality: Left;   RE-EXCISION OF BREAST LUMPECTOMY Left 11/27/2014   Procedure: LEFT PARTIAL LUMPECTOMY RE-EXCISION LATERAL AND POSTERIOR MARGINS;  Surgeon: Fanny Skates, MD;  Location: [facility];  Service: General;  Laterality: Left;   TONSILLECTOMY     TOTAL KNEE ARTHROPLASTY Left 01/09/2014   Procedure: LEFT TOTAL KNEE ARTHROPLASTY;  Surgeon: Kerin Salen, MD;  Location: [facility];  Service: Orthopedics;  Laterality: Left;   TOTAL KNEE ARTHROPLASTY Right 11/09/2017   Procedure: RIGHT TOTAL KNEE ARTHROPLASTY;  Surgeon: Gaynelle Arabian, MD;  Location: [facility];  Service: Orthopedics;  Laterality: Right;      FAMILY HISTORY Family History  Problem Relation Age of Onset   Breast cancer Mother 42       TAH/BSO; deceased 29   Prostate cancer Brother 35       deceased 74   Breast cancer Maternal Aunt 70       deceased 9   Breast cancer Paternal Aunt 27       Currently 38   Cancer Paternal Grandmother        unknown type; deceased 25s  the patient's father died at the age of 42 following a fall. The patient's mother was diagnosed with breast cancer around age 58. She died with Alzheimer's disease around age 48. The patient had 4 brothers, 3 sisters. One brother was diagnosed with prostate cancer at the age of 59. In addition a paternal aunt was diagnosed with breast cancer at age 63. She is not 72 years old. She has 6 daughters mostly in their 79s none of whom have had breast cancer. There is no history of ovarian cancer in the family.  GYNECOLOGIC HISTORY:  No LMP recorded. Patient is postmenopausal. Menarche age 48, first live birth age 57. The patient is GX P1. The patient went through menopause around 2008. She did not take hormone replacement. She did take oral contraceptives remotely for more than a year with no complications  SOCIAL HISTORY:  Melissa Holloway is retired from [employer]. Her husband Melissa Holloway") is a retired Administrator. Their son Melissa Holloway lives in [place] and works in Press photographer. The  patient has one 85 year old-old granddaughter and a grandson born June 2016    ADVANCED DIRECTIVES: not in place   HEALTH MAINTENANCE: Social History   Tobacco Use   Smoking status: Never Smoker   Smokeless tobacco: Never Used  Substance Use Topics   Alcohol use: No   Drug use: No     Colonoscopy:  PAP:  Bone density:  Lipid panel:  Allergies  Allergen Reactions   Rivaroxaban Rash    Current Outpatient Medications  Medication Sig Dispense Refill   atorvastatin (LIPITOR) 20 MG tablet Take 20 mg by mouth daily.     loratadine (CLARITIN) 10 MG tablet Take 10 mg by mouth  daily as needed for allergies.     methocarbamol (ROBAXIN) 500 MG tablet Take 1 tablet (500 mg total) by mouth every 6 (six) hours as needed for muscle spasms. 80 tablet 0   Propylene Glycol (SYSTANE BALANCE OP) Place 1 drop into both eyes every morning.     tamoxifen (NOLVADEX) 20 MG tablet Take 1 tablet by mouth once daily 90 tablet 0   traMADol (ULTRAM) 50 MG tablet Take 1-2 tablets (50-100 mg total) by mouth every 6 (six) hours as needed (mild pain). 56 tablet 0   No current facility-administered medications for this visit.     OBJECTIVE: middle-aged white woman who appears well  There were no vitals filed for this visit.   There is no height or weight on file to calculate BMI.    ECOG FS:0 - Asymptomatic   LAB RESULTS:  CMP     Component Value Date/Time   NA 141 11/26/2017 1400   NA 143 10/23/2016 1025   K 4.0 11/26/2017 1400   K 4.7 10/23/2016 1025   CL 107 11/26/2017 1400   CO2 26 11/26/2017 1400   CO2 27 10/23/2016 1025   GLUCOSE 90 11/26/2017 1400   GLUCOSE 97 10/23/2016 1025   BUN 9 11/26/2017 1400   BUN 14.1 10/23/2016 1025   CREATININE 0.73 11/26/2017 1400   CREATININE 0.8 10/23/2016 1025   CALCIUM 9.5 11/26/2017 1400   CALCIUM 10.4 10/23/2016 1025   PROT 6.6 11/26/2017 1400   PROT 7.4 10/23/2016 1025   ALBUMIN 3.4 (L) 11/26/2017 1400   ALBUMIN 4.2 10/23/2016 1025   AST 18 11/26/2017 1400   AST 23 10/23/2016 1025   ALT 16 11/26/2017 1400   ALT 25 10/23/2016 1025   ALKPHOS 75 11/26/2017 1400   ALKPHOS 121 10/23/2016 1025   BILITOT 0.3 11/26/2017 1400   BILITOT 0.81 10/23/2016 1025   GFRNONAA >60 11/26/2017 1400   GFRAA >60 11/26/2017 1400    INo results found for: SPEP, UPEP  Lab Results  Component Value Date   WBC 3.1 (L) 11/26/2017   NEUTROABS 1.9 11/26/2017   HGB 10.7 (L) 11/26/2017   HCT 32.8 (L) 11/26/2017   MCV 86.8 11/26/2017   PLT 424 (H) 11/26/2017      Chemistry      Component Value Date/Time   NA 141 11/26/2017 1400   NA 143  10/23/2016 1025   K 4.0 11/26/2017 1400   K 4.7 10/23/2016 1025   CL 107 11/26/2017 1400   CO2 26 11/26/2017 1400   CO2 27 10/23/2016 1025   BUN 9 11/26/2017 1400   BUN 14.1 10/23/2016 1025   CREATININE 0.73 11/26/2017 1400   CREATININE 0.8 10/23/2016 1025      Component Value Date/Time   CALCIUM 9.5 11/26/2017 1400   CALCIUM 10.4 10/23/2016 1025  ALKPHOS 75 11/26/2017 1400   ALKPHOS 121 10/23/2016 1025   AST 18 11/26/2017 1400   AST 23 10/23/2016 1025   ALT 16 11/26/2017 1400   ALT 25 10/23/2016 1025   BILITOT 0.3 11/26/2017 1400   BILITOT 0.81 10/23/2016 1025       No results found for: LABCA2  No components found for: LABCA125  No results for input(s): INR in the last 168 hours.  Urinalysis    Component Value Date/Time   COLORURINE YELLOW 01/03/2014 [place] 01/03/2014 1042   LABSPEC 1.012 01/03/2014 1042   PHURINE 6.0 01/03/2014 1042   GLUCOSEU NEGATIVE 01/03/2014 1042   HGBUR NEGATIVE 01/03/2014 1042   BILIRUBINUR NEGATIVE 01/03/2014 1042   KETONESUR NEGATIVE 01/03/2014 1042   PROTEINUR NEGATIVE 01/03/2014 1042   UROBILINOGEN 0.2 01/03/2014 1042   NITRITE NEGATIVE 01/03/2014 1042   LEUKOCYTESUR TRACE (A) 01/03/2014 1042    STUDIES: No results found.   ASSESSMENT: 73 y.o. [place] woman status post left breast upper inner quadrant biopsy 08/31/2014 for a clinical T1b N0, stage IA invasive ductal carcinoma, grade 1, strongly estrogen and progesterone receptor positive, HER-2 not amplified, with an MIB-1 of 10%  (1) MRI biopsy of 2 additional lesions showed  (a) in the left central breast, atypical lobular hyperplasia and atypical ductal hyperplasia  (b) in the right breast lower outer quadrant a fibroadenoma   (2) dual lumpectomy 10/18/2014 showed  (a) labeled "left upper inner quadrant", a pT1b pN0, stage IA invasive ductal carcinoma, grade 1, and again HER-2 negative  (b) labeled "left central", ductal carcinoma in situ, low-grade,  with a positive lateral and other close margins   (3) additional surgery for margin clearance 11/27/2014 was successful, although the lateral margin remained close at 1 mm  (4) Oncotype DX score of 9 predicts an outside the breast risk of recurrence within 10 years of 6% if the patient's only systemic treatment is tamoxifen for 5 years. It also predicts no benefit from chemotherapy.   (5)  adjuvant radiation 01/10/2015 through 02/21/2015:   Left breast 5000 cGy 25 sessions, left breast boost 1000 cGy 5 sessions    (6) anastrozole started 03/23/2015  (a) bone density through [facility] was normal 2016  (b) anastrozole held 10/30/2016 secondary to arthralgias/myalgias   (c) tamoxifen started 12/20/2016  PLAN:  Melissa Holloway is now just over 4 years out from definitive surgery for her breast cancer with no evidence of disease recurrence.  This is very favorable.  She is tolerating tamoxifen remarkably well the plan will be to continue that through July 2021  I have encouraged her to continue her excellent exercise program.  She will see me again in a year and that likely will be her "graduation visit".   Eulalio Reamy, Virgie Dad, MD  12/28/18 1:25 PM Medical Oncology and Hematology [facility] [address] [place], [place] [address] Tel. [phone]    Fax. [phone]   I, Wilburn Mylar, am acting as scribe for Dr. Virgie Dad. Phil Corti.  I, Lurline Del MD, have reviewed the above documentation for accuracy and completeness, and I agree with the above.

## 2018-12-29 DIAGNOSIS — M71571 Other bursitis, not elsewhere classified, right ankle and foot: Secondary | ICD-10-CM | POA: Diagnosis not present

## 2018-12-29 DIAGNOSIS — M722 Plantar fascial fibromatosis: Secondary | ICD-10-CM | POA: Diagnosis not present

## 2019-02-11 DIAGNOSIS — N649 Disorder of breast, unspecified: Secondary | ICD-10-CM | POA: Diagnosis not present

## 2019-02-11 DIAGNOSIS — C50919 Malignant neoplasm of unspecified site of unspecified female breast: Secondary | ICD-10-CM | POA: Diagnosis not present

## 2019-02-17 ENCOUNTER — Ambulatory Visit
Admission: RE | Admit: 2019-02-17 | Discharge: 2019-02-17 | Disposition: A | Discharge status: Home / Self Care | Payer: Medicare Other | Source: Ambulatory Visit | Attending: Family Medicine | Admitting: Family Medicine

## 2019-02-17 ENCOUNTER — Other Ambulatory Visit: Payer: Self-pay | Admitting: Family Medicine

## 2019-02-17 DIAGNOSIS — R928 Other abnormal and inconclusive findings on diagnostic imaging of breast: Secondary | ICD-10-CM | POA: Diagnosis not present

## 2019-02-17 DIAGNOSIS — N649 Disorder of breast, unspecified: Secondary | ICD-10-CM

## 2019-02-17 DIAGNOSIS — C50919 Malignant neoplasm of unspecified site of unspecified female breast: Secondary | ICD-10-CM

## 2019-02-17 DIAGNOSIS — N644 Mastodynia: Secondary | ICD-10-CM | POA: Diagnosis not present

## 2019-02-17 DIAGNOSIS — Z853 Personal history of malignant neoplasm of breast: Secondary | ICD-10-CM | POA: Diagnosis not present

## 2019-03-10 DIAGNOSIS — Z0000 Encounter for general adult medical examination without abnormal findings: Secondary | ICD-10-CM | POA: Diagnosis not present

## 2019-03-10 DIAGNOSIS — E559 Vitamin D deficiency, unspecified: Secondary | ICD-10-CM | POA: Diagnosis not present

## 2019-03-10 DIAGNOSIS — Z79899 Other long term (current) drug therapy: Secondary | ICD-10-CM | POA: Diagnosis not present

## 2019-03-10 DIAGNOSIS — E7800 Pure hypercholesterolemia, unspecified: Secondary | ICD-10-CM | POA: Diagnosis not present

## 2019-03-24 ENCOUNTER — Other Ambulatory Visit: Payer: Self-pay | Admitting: Oncology

## 2019-03-24 DIAGNOSIS — C50212 Malignant neoplasm of upper-inner quadrant of left female breast: Secondary | ICD-10-CM

## 2019-03-24 DIAGNOSIS — Z170 Estrogen receptor positive status [ER+]: Secondary | ICD-10-CM

## 2019-04-29 DIAGNOSIS — Z23 Encounter for immunization: Secondary | ICD-10-CM | POA: Diagnosis not present

## 2019-05-05 DIAGNOSIS — Z0120 Encounter for dental examination and cleaning without abnormal findings: Secondary | ICD-10-CM | POA: Diagnosis not present

## 2019-07-28 DIAGNOSIS — Z20828 Contact with and (suspected) exposure to other viral communicable diseases: Secondary | ICD-10-CM | POA: Diagnosis not present

## 2019-07-28 DIAGNOSIS — Z03818 Encounter for observation for suspected exposure to other biological agents ruled out: Secondary | ICD-10-CM | POA: Diagnosis not present

## 2019-09-01 ENCOUNTER — Other Ambulatory Visit: Payer: Self-pay | Admitting: Oncology

## 2019-09-01 DIAGNOSIS — Z9889 Other specified postprocedural states: Secondary | ICD-10-CM

## 2019-09-01 DIAGNOSIS — N644 Mastodynia: Secondary | ICD-10-CM

## 2019-09-15 ENCOUNTER — Ambulatory Visit: Payer: Medicare Other

## 2019-09-30 ENCOUNTER — Ambulatory Visit: Payer: Medicare Other

## 2019-10-13 ENCOUNTER — Other Ambulatory Visit: Payer: Self-pay

## 2019-10-13 ENCOUNTER — Ambulatory Visit
Admission: RE | Admit: 2019-10-13 | Discharge: 2019-10-13 | Disposition: A | Discharge status: Home / Self Care | Payer: Medicare Other | Source: Ambulatory Visit | Attending: Oncology | Admitting: Oncology

## 2019-10-13 DIAGNOSIS — Z9889 Other specified postprocedural states: Secondary | ICD-10-CM

## 2019-10-13 DIAGNOSIS — N6489 Other specified disorders of breast: Secondary | ICD-10-CM | POA: Diagnosis not present

## 2019-10-13 DIAGNOSIS — N644 Mastodynia: Secondary | ICD-10-CM

## 2019-10-13 DIAGNOSIS — R928 Other abnormal and inconclusive findings on diagnostic imaging of breast: Secondary | ICD-10-CM | POA: Diagnosis not present

## 2019-11-10 DIAGNOSIS — Z0120 Encounter for dental examination and cleaning without abnormal findings: Secondary | ICD-10-CM | POA: Diagnosis not present

## 2019-12-05 DIAGNOSIS — Z419 Encounter for procedure for purposes other than remedying health state, unspecified: Secondary | ICD-10-CM | POA: Diagnosis not present

## 2019-12-05 DIAGNOSIS — L821 Other seborrheic keratosis: Secondary | ICD-10-CM | POA: Diagnosis not present

## 2019-12-05 DIAGNOSIS — L570 Actinic keratosis: Secondary | ICD-10-CM | POA: Diagnosis not present

## 2019-12-27 ENCOUNTER — Other Ambulatory Visit: Payer: Self-pay | Admitting: *Deleted

## 2019-12-27 DIAGNOSIS — Z170 Estrogen receptor positive status [ER+]: Secondary | ICD-10-CM

## 2019-12-27 DIAGNOSIS — C50212 Malignant neoplasm of upper-inner quadrant of left female breast: Secondary | ICD-10-CM

## 2019-12-27 NOTE — Progress Notes (Signed)
[facility]  [phone] [phone]     ID: Melissa Holloway DOB: November 16, 1945  MR#: [id number]  JYN#:[id number]  Patient Care Team: Jonathon Jordan, MD as PCP - General (Family Medicine) Nation Cradle, Virgie Dad, MD as Consulting Physician (Oncology) Fanny Skates, MD as Consulting Physician (General Surgery) Arloa Koh, MD (Inactive) as Consulting Physician (Radiation Oncology) Sylvan Cheese, NP as Nurse Practitioner (Hematology and Oncology) OTHER MD: Frederik Pear MD, Lavonna Monarch MD   CHIEF COMPLAINT:  estrogen receptor positive breast cancer  CURRENT TREATMENT: Completing 5 years of tamoxifen   INTERVAL HISTORY: Melissa Holloway returns today for follow-up of her estrogen receptor positive breast cancer.   She continues on tamoxifen, with good tolerance. She denies hot flashes and vaginal discharge.  She took her "last pill" 2 days ago.  She has not noticed any change overall of course since stopping the medication  Since her last visit, she presented with a small palpable medial right breast lump. She underwent right diagnostic mammography with tomography and right breast ultrasonography at [facility] on 02/17/2019 showing: breast density category B; no evidence of malignancy.  She returned for annual mammography on 10/13/2019. She underwent bilateral diagnostic mammography with tomography and right breast ultrasonography at [facility] showing: breast density category B; no evidence of malignancy in either breast.    REVIEW OF SYSTEMS: Melissa Holloway has had both her mother and have vaccines.  She walks several days a week with her husband.  She tells me she can no longer find what ever the problem was in the right breast that she noted previously.  A detailed review of systems today was otherwise noncontributory   BREAST CANCER HISTORY: From the initial intake note:  Melissa Holloway had routine screening mammography with tomography [facility] 02[facility]21/2015. This showed a possible mass in the left breast. On 08/16/2014 left diagnostic mammography and ultrasonography found the breast density to be category B. In the upper inner quadrant of the left breast there was a 7 mm spiculated mass which was not palpable by exam. Ultrasound confirmed a 5 mm irregular to her than wide hypoechoic mass in the area in question. There was no left axillary adenopathy.  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  On 09/08/2014 the patient underwent bilateral breast MRI. This confirmed the biopsy-proven malignancy in the upper inner left breast and measured it at 0.8 cm. There was also an irregular enhancing mass in the lower outer left breast measuring 1 cm maximally. Also in the right breast there was an irregular enhancing mass in the lower outer quadrant measuring 0.6 cm. There was no abnormal adenopathy in either axilla and no internal mammary adenopathy. There were incidental cysts noted in the liver, previously imaged by ultrasound in January 2014.  Ultrasound of both breasts on 09/14/2014 failed to identify the additionalsuspicious areas seen on MRI.accordingly the patient proceeded to bilateral MRI guided biopsies on 09/27/2014. In the right breast lower outer quadrant there was a fibroadenoma with no atypia or malignancy. In the left breast central biopsy there was atypical ductal and atypical lobular hyperplasia.  Her subsequent history is as detailed below   PAST MEDICAL HISTORY: Past Medical History:  Diagnosis Date  . Arthritis  right knee; right pinky finger  . Breast cancer, left breast ([place]) 08/23/2014  . Esophageal hernia    s/p  . Family history of  breast cancer   . Personal history of radiation therapy   . PONV (postoperative nausea and vomiting)   . S/P radiation therapy 01/10/2015 through 02/21/2015    Left breast 5000 cGy 25 sessions, left breast boost 1000 cGy 5 sessions      PAST SURGICAL HISTORY: Past Surgical History:  Procedure Laterality Date  . BREAST LUMPECTOMY Left   . COLONOSCOPY    . esophageal hernia  2010  . RADIOACTIVE SEED GUIDED PARTIAL MASTECTOMY WITH AXILLARY SENTINEL LYMPH NODE BIOPSY Left 10/18/2014   Procedure: LEFT PARTIAL MASTECTOMY AND 1 LEFT RADIOACTIVE SEED 1 NEEDLE LOCALIZATION LEFT AXILLARY SENTINAL NODE BIOPSY;  Surgeon: Fanny Skates, MD;  Location: [facility];  Service: General;  Laterality: Left;  . RE-EXCISION OF BREAST LUMPECTOMY Left 11/27/2014   Procedure: LEFT PARTIAL LUMPECTOMY RE-EXCISION LATERAL AND POSTERIOR MARGINS;  Surgeon: Fanny Skates, MD;  Location: [facility];  Service: General;  Laterality: Left;  . TONSILLECTOMY    . TOTAL KNEE ARTHROPLASTY Left 01/09/2014   Procedure: LEFT TOTAL KNEE ARTHROPLASTY;  Surgeon: Kerin Salen, MD;  Location: [facility];  Service: Orthopedics;  Laterality: Left;  . TOTAL KNEE ARTHROPLASTY Right 11/09/2017   Procedure: RIGHT TOTAL KNEE ARTHROPLASTY;  Surgeon: Gaynelle Arabian, MD;  Location: [facility];  Service: Orthopedics;  Laterality: Right;    FAMILY HISTORY Family History  Problem Relation Age of Onset  . Breast cancer Mother 36       TAH/BSO; deceased 34  . Prostate cancer Brother 50       deceased 36  . Breast cancer Maternal Aunt 70       deceased 62  . Breast cancer Paternal Aunt 70       Currently 101  . Cancer Paternal Grandmother        unknown type; deceased 12s  the patient's father died at the age of 60 following a fall. The patient's mother was diagnosed with breast cancer around age 51. She died with Alzheimer's disease around age 53. The  patient had 4 brothers, 3 sisters. One brother was diagnosed with prostate cancer at the age of 12. In addition a paternal aunt was diagnosed with breast cancer at age 71. She is not 73 years old. She has 6 daughters mostly in their 40s none of whom have had breast cancer. There is no history of ovarian cancer in the family.   GYNECOLOGIC HISTORY:  No LMP recorded. Patient is postmenopausal. Menarche age 34, first live birth age 82. The patient is GX P1. The patient went through menopause around 2008. She did not take hormone replacement. She did take oral contraceptives remotely for more than a year with no complications   SOCIAL HISTORY: (Updated May 2021) Melissa Holloway is retired from [employer]. Her husband [name]") is a retired Administrator. Their son [name] lives in [place] and works in Press photographer. The patient has one 62 year old-old granddaughter and a grandson born June 2016    ADVANCED DIRECTIVES: In the absence of any documents to the contrary the patient's husband is her healthcare power of attorney   HEALTH MAINTENANCE: Social History   Tobacco Use  . Smoking status: Never Smoker  . Smokeless tobacco: Never Used  Substance Use Topics  . Alcohol use: No  . Drug use: No     Colonoscopy:  PAP:  Bone density:  Lipid panel:  Allergies  Allergen Reactions  . Rivaroxaban Rash    Current Outpatient Medications  Medication Sig Dispense Refill  . atorvastatin (LIPITOR) 20 MG tablet Take 20 mg by mouth daily.    Marland Kitchen loratadine (CLARITIN) 10 MG tablet Take 10 mg by mouth daily as needed for allergies.    . methocarbamol (ROBAXIN) 500 MG tablet Take 1 tablet (500 mg total) by mouth every 6 (six) hours as needed for muscle spasms. 80 tablet 0  . Propylene Glycol (SYSTANE BALANCE OP) Place 1 drop into both eyes every morning.    . tamoxifen (NOLVADEX) 20 MG tablet Take 1 tablet by mouth once daily 90 tablet 2  . traMADol (ULTRAM) 50 MG tablet Take 1-2 tablets (50-100 mg total) by  mouth every 6 (six) hours as needed (mild pain). 56 tablet 0   No current facility-administered medications for this visit.    OBJECTIVE:  white woman in no acute distress  Vitals:   12/28/19 1328  BP: 132/69  Pulse: 66  Resp: 18  Temp: 98.2 F (36.8 C)  SpO2: 100%     Body mass index is 34.7 kg/m.    ECOG FS:0 - Asymptomatic  Sclerae unicteric, EOMs intact Wearing a mask No cervical or supraclavicular adenopathy Lungs no rales or rhonchi Heart regular rate and rhythm Abd soft, nontender, positive bowel sounds MSK no focal spinal tenderness, no upper extremity lymphedema Neuro: nonfocal, well oriented, appropriate affect Breasts: Both breasts are status post lumpectomy.  There is no evidence of local recurrence.  In the right breast specifically where she previously felt a change as she no longer feels it and I cannot find any abnormality in that area.  Both axillae are benign.   LAB RESULTS:  CMP     Component Value Date/Time   NA 143 12/28/2019 1310   NA 143 10/23/2016 1025   K 5.0 12/28/2019 1310   K 4.7 10/23/2016 1025   CL 107 12/28/2019 1310   CO2 29 12/28/2019 1310   CO2 27 10/23/2016 1025   GLUCOSE 84 12/28/2019 1310   GLUCOSE 97 10/23/2016 1025   BUN 11 12/28/2019 1310   BUN 14.1 10/23/2016 1025   CREATININE 0.75 12/28/2019 1310   CREATININE 0.8 10/23/2016 1025   CALCIUM 9.6 12/28/2019 1310   CALCIUM 10.4 10/23/2016 1025   PROT 6.8 12/28/2019 1310   PROT 7.4 10/23/2016 1025   ALBUMIN 3.8 12/28/2019 1310   ALBUMIN 4.2 10/23/2016 1025   AST 23 12/28/2019 1310   AST 23 10/23/2016 1025   ALT 19 12/28/2019 1310   ALT 25 10/23/2016 1025   ALKPHOS 65 12/28/2019 1310   ALKPHOS 121 10/23/2016 1025   BILITOT 0.5 12/28/2019 1310   BILITOT 0.81 10/23/2016 1025   GFRNONAA >60 12/28/2019 1310   GFRAA >60 12/28/2019 1310    INo results found for: SPEP, UPEP  Lab Results  Component Value Date   WBC 4.8 12/28/2019   NEUTROABS 2.5 12/28/2019   HGB 13.2  12/28/2019   HCT 42.0 12/28/2019   MCV 90.9 12/28/2019   PLT 231 12/28/2019      Chemistry      Component Value Date/Time   NA 143 12/28/2019 1310   NA 143 10/23/2016 1025   K 5.0 12/28/2019 1310   K 4.7 10/23/2016 1025   CL 107 12/28/2019 1310   CO2 29 12/28/2019 1310   CO2 27 10/23/2016 1025   BUN 11 12/28/2019 1310   BUN 14.1 10/23/2016  1025   CREATININE 0.75 12/28/2019 1310   CREATININE 0.8 10/23/2016 1025      Component Value Date/Time   CALCIUM 9.6 12/28/2019 1310   CALCIUM 10.4 10/23/2016 1025   ALKPHOS 65 12/28/2019 1310   ALKPHOS 121 10/23/2016 1025   AST 23 12/28/2019 1310   AST 23 10/23/2016 1025   ALT 19 12/28/2019 1310   ALT 25 10/23/2016 1025   BILITOT 0.5 12/28/2019 1310   BILITOT 0.81 10/23/2016 1025       No results found for: LABCA2  No components found for: LABCA125  No results for input(s): INR in the last 168 hours.  Urinalysis    Component Value Date/Time   COLORURINE YELLOW 01/03/2014 [place] 01/03/2014 1042   LABSPEC 1.012 01/03/2014 1042   PHURINE 6.0 01/03/2014 1042   GLUCOSEU NEGATIVE 01/03/2014 1042   HGBUR NEGATIVE 01/03/2014 1042   BILIRUBINUR NEGATIVE 01/03/2014 1042   KETONESUR NEGATIVE 01/03/2014 1042   PROTEINUR NEGATIVE 01/03/2014 1042   UROBILINOGEN 0.2 01/03/2014 1042   NITRITE NEGATIVE 01/03/2014 1042   LEUKOCYTESUR TRACE (A) 01/03/2014 1042    STUDIES: No results found.   ASSESSMENT: 74 y.o. [place] woman status post left breast upper inner quadrant biopsy 08/31/2014 for a clinical T1b N0, stage IA invasive ductal carcinoma, grade 1, strongly estrogen and progesterone receptor positive, HER-2 not amplified, with an MIB-1 of 10%  (1) MRI biopsy of 2 additional lesions showed  (a) in the left central breast, atypical lobular hyperplasia and atypical ductal hyperplasia  (b) in the right breast lower outer quadrant a fibroadenoma   (2) dual lumpectomy 10/18/2014 showed  (a) labeled "left  upper inner quadrant", a pT1b pN0, stage IA invasive ductal carcinoma, grade 1, and again HER-2 negative  (b) labeled "left central", ductal carcinoma in situ, low-grade, with a positive lateral and other close margins   (3) additional surgery for margin clearance 11/27/2014 was successful, although the lateral margin remained close at 1 mm  (4) Oncotype DX score of 9 predicts an outside the breast risk of recurrence within 10 years of 6% if the patient's only systemic treatment is tamoxifen for 5 years. It also predicts no benefit from chemotherapy.   (5)  adjuvant radiation 01/10/2015 through 02/21/2015:   Left breast 5000 cGy 25 sessions, left breast boost 1000 cGy 5 sessions    (6) anastrozole started 03/23/2015  (a) bone density through [facility] was normal 2016  (b) anastrozole held 10/30/2016 secondary to arthralgias/myalgias   (c) tamoxifen started 12/20/2016, completing 5 years of anti-estrogens May 2021   PLAN: Melissa Holloway is now a little over 5 years out from definitive surgery for breast cancer.  She is completing 5 years of tamoxifen.  She tolerated that well, with no side effects of concern  I reassured her that I do not feel anything in her right breast and of course her mammogram and ultrasound recently were also very reassuring.  I gave her information on our survivorship option but she is not interested in pursuing that.  At this point I feel comfortable releasing Melissa Holloway to her primary care physician's care.  All she will need as far as breast cancer follow-up is concerned is her yearly mammography and a yearly physician breast exam  I will be glad to see her again at any point in the future if and when the need arises but as of now are making no further routine appointments for her here  Total encounter time 25 minutes.* Anamari Galeas, Virgie Dad,  MD  12/28/19 1:57 PM Medical Oncology and Hematology [facility] [place], [place] [address] Tel. [phone]    Fax. [phone]   I, Wilburn Mylar, am acting as scribe for Dr. Virgie Dad. Monetta Lick.  I, Lurline Del MD, have reviewed the above documentation for accuracy and completeness, and I agree with the above.   *Total Encounter Time as defined by the Centers for Medicare and Medicaid Services includes, in addition to the face-to-face time of a patient visit (documented in the note above) non-face-to-face time: obtaining and reviewing outside history, ordering and reviewing medications, tests or procedures, care coordination (communications with other health care professionals or caregivers) and documentation in the medical record.

## 2019-12-28 ENCOUNTER — Other Ambulatory Visit: Payer: Self-pay

## 2019-12-28 ENCOUNTER — Inpatient Hospital Stay: Payer: Medicare Other | Admitting: Oncology

## 2019-12-28 ENCOUNTER — Inpatient Hospital Stay: Payer: Medicare Other | Attending: Oncology

## 2019-12-28 VITALS — BP 132/69 | HR 66 | Temp 98.2°F | Resp 18 | Ht 63.0 in | Wt 195.9 lb

## 2019-12-28 DIAGNOSIS — Z170 Estrogen receptor positive status [ER+]: Secondary | ICD-10-CM

## 2019-12-28 DIAGNOSIS — C50212 Malignant neoplasm of upper-inner quadrant of left female breast: Secondary | ICD-10-CM

## 2019-12-28 DIAGNOSIS — Z923 Personal history of irradiation: Secondary | ICD-10-CM | POA: Diagnosis not present

## 2019-12-28 DIAGNOSIS — Z853 Personal history of malignant neoplasm of breast: Secondary | ICD-10-CM | POA: Insufficient documentation

## 2019-12-28 LAB — CBC WITH DIFFERENTIAL (CANCER CENTER ONLY)
Abs Immature Granulocytes: 0.01 10*3/uL (ref 0.00–0.07)
Basophils Absolute: 0.1 10*3/uL (ref 0.0–0.1)
Basophils Relative: 1 %
Eosinophils Absolute: 0.1 10*3/uL (ref 0.0–0.5)
Eosinophils Relative: 2 %
HCT: 42 % (ref 36.0–46.0)
Hemoglobin: 13.2 g/dL (ref 12.0–15.0)
Immature Granulocytes: 0 %
Lymphocytes Relative: 34 %
Lymphs Abs: 1.6 10*3/uL (ref 0.7–4.0)
MCH: 28.6 pg (ref 26.0–34.0)
MCHC: 31.4 g/dL (ref 30.0–36.0)
MCV: 90.9 fL (ref 80.0–100.0)
Monocytes Absolute: 0.5 10*3/uL (ref 0.1–1.0)
Monocytes Relative: 10 %
Neutro Abs: 2.5 10*3/uL (ref 1.7–7.7)
Neutrophils Relative %: 53 %
Platelet Count: 231 10*3/uL (ref 150–400)
RBC: 4.62 MIL/uL (ref 3.87–5.11)
RDW: 13.8 % (ref 11.5–15.5)
WBC Count: 4.8 10*3/uL (ref 4.0–10.5)
nRBC: 0 % (ref 0.0–0.2)

## 2019-12-28 LAB — CMP (CANCER CENTER ONLY)
ALT: 19 U/L (ref 0–44)
AST: 23 U/L (ref 15–41)
Albumin: 3.8 g/dL (ref 3.5–5.0)
Alkaline Phosphatase: 65 U/L (ref 38–126)
Anion gap: 7 (ref 5–15)
BUN: 11 mg/dL (ref 8–23)
CO2: 29 mmol/L (ref 22–32)
Calcium: 9.6 mg/dL (ref 8.9–10.3)
Chloride: 107 mmol/L (ref 98–111)
Creatinine: 0.75 mg/dL (ref 0.44–1.00)
GFR, Est AFR Am: >60 mL/min (ref >60)
GFR, Estimated: >60 mL/min (ref >60)
Glucose, Bld: 84 mg/dL (ref 70–99)
Potassium: 5 mmol/L (ref 3.5–5.1)
Sodium: 143 mmol/L (ref 135–145)
Total Bilirubin: 0.5 mg/dL (ref 0.3–1.2)
Total Protein: 6.8 g/dL (ref 6.5–8.1)

## 2019-12-29 ENCOUNTER — Telehealth: Payer: Self-pay | Admitting: Oncology

## 2019-12-29 NOTE — Telephone Encounter (Signed)
No 5/26 LOS. No changes made to pt's schedule.

## 2020-03-27 DIAGNOSIS — Z0000 Encounter for general adult medical examination without abnormal findings: Secondary | ICD-10-CM | POA: Diagnosis not present

## 2020-03-27 DIAGNOSIS — Z79899 Other long term (current) drug therapy: Secondary | ICD-10-CM | POA: Diagnosis not present

## 2020-03-27 DIAGNOSIS — E7800 Pure hypercholesterolemia, unspecified: Secondary | ICD-10-CM | POA: Diagnosis not present

## 2020-03-27 DIAGNOSIS — E559 Vitamin D deficiency, unspecified: Secondary | ICD-10-CM | POA: Diagnosis not present

## 2020-04-02 DIAGNOSIS — H00022 Hordeolum internum right lower eyelid: Secondary | ICD-10-CM | POA: Diagnosis not present

## 2020-04-02 DIAGNOSIS — H0102A Squamous blepharitis right eye, upper and lower eyelids: Secondary | ICD-10-CM | POA: Diagnosis not present

## 2020-04-02 DIAGNOSIS — H2513 Age-related nuclear cataract, bilateral: Secondary | ICD-10-CM | POA: Diagnosis not present

## 2020-04-02 DIAGNOSIS — H0102B Squamous blepharitis left eye, upper and lower eyelids: Secondary | ICD-10-CM | POA: Diagnosis not present

## 2020-04-30 DIAGNOSIS — Z0120 Encounter for dental examination and cleaning without abnormal findings: Secondary | ICD-10-CM | POA: Diagnosis not present

## 2020-05-01 DIAGNOSIS — D1801 Hemangioma of skin and subcutaneous tissue: Secondary | ICD-10-CM | POA: Diagnosis not present

## 2020-05-01 DIAGNOSIS — I788 Other diseases of capillaries: Secondary | ICD-10-CM | POA: Diagnosis not present

## 2020-05-01 DIAGNOSIS — D225 Melanocytic nevi of trunk: Secondary | ICD-10-CM | POA: Diagnosis not present

## 2020-05-01 DIAGNOSIS — L821 Other seborrheic keratosis: Secondary | ICD-10-CM | POA: Diagnosis not present

## 2020-06-11 DIAGNOSIS — Z23 Encounter for immunization: Secondary | ICD-10-CM | POA: Diagnosis not present

## 2020-07-04 ENCOUNTER — Other Ambulatory Visit: Payer: Self-pay | Admitting: Family Medicine

## 2020-07-04 DIAGNOSIS — E2839 Other primary ovarian failure: Secondary | ICD-10-CM

## 2020-07-10 ENCOUNTER — Other Ambulatory Visit: Payer: Self-pay | Admitting: Family Medicine

## 2020-08-30 ENCOUNTER — Other Ambulatory Visit: Payer: Self-pay | Admitting: Oncology

## 2020-08-30 DIAGNOSIS — Z9889 Other specified postprocedural states: Secondary | ICD-10-CM

## 2020-10-18 ENCOUNTER — Ambulatory Visit
Admission: RE | Admit: 2020-10-18 | Discharge: 2020-10-18 | Disposition: A | Discharge status: Home / Self Care | Payer: Medicare Other | Source: Ambulatory Visit | Attending: Family Medicine | Admitting: Family Medicine

## 2020-10-18 ENCOUNTER — Other Ambulatory Visit: Payer: Self-pay

## 2020-10-18 ENCOUNTER — Ambulatory Visit
Admission: RE | Admit: 2020-10-18 | Discharge: 2020-10-18 | Disposition: A | Discharge status: Home / Self Care | Payer: Medicare Other | Source: Ambulatory Visit | Attending: Oncology | Admitting: Oncology

## 2020-10-18 DIAGNOSIS — Z780 Asymptomatic menopausal state: Secondary | ICD-10-CM | POA: Diagnosis not present

## 2020-10-18 DIAGNOSIS — Z9889 Other specified postprocedural states: Secondary | ICD-10-CM

## 2020-10-18 DIAGNOSIS — R922 Inconclusive mammogram: Secondary | ICD-10-CM | POA: Diagnosis not present

## 2020-10-18 DIAGNOSIS — Z853 Personal history of malignant neoplasm of breast: Secondary | ICD-10-CM | POA: Diagnosis not present

## 2020-10-18 DIAGNOSIS — E2839 Other primary ovarian failure: Secondary | ICD-10-CM

## 2021-04-15 DIAGNOSIS — E7800 Pure hypercholesterolemia, unspecified: Secondary | ICD-10-CM | POA: Diagnosis not present

## 2021-04-15 DIAGNOSIS — Z0000 Encounter for general adult medical examination without abnormal findings: Secondary | ICD-10-CM | POA: Diagnosis not present

## 2021-04-15 DIAGNOSIS — Z79899 Other long term (current) drug therapy: Secondary | ICD-10-CM | POA: Diagnosis not present

## 2021-04-15 DIAGNOSIS — E559 Vitamin D deficiency, unspecified: Secondary | ICD-10-CM | POA: Diagnosis not present

## 2021-05-17 DIAGNOSIS — D225 Melanocytic nevi of trunk: Secondary | ICD-10-CM | POA: Diagnosis not present

## 2021-05-17 DIAGNOSIS — L570 Actinic keratosis: Secondary | ICD-10-CM | POA: Diagnosis not present

## 2021-05-17 DIAGNOSIS — L821 Other seborrheic keratosis: Secondary | ICD-10-CM | POA: Diagnosis not present

## 2021-05-17 DIAGNOSIS — L820 Inflamed seborrheic keratosis: Secondary | ICD-10-CM | POA: Diagnosis not present

## 2021-05-29 IMAGING — MG DIGITAL DIAGNOSTIC BILAT W/ TOMO W/ CAD
6 of 10 series · 6 of 30 positions shown · non-contrast
Comparison: Prior films

CLINICAL DATA: Personal history of left breast cancer status post
lumpectomy 8240.

EXAM:
DIGITAL DIAGNOSTIC BILATERAL MAMMOGRAM WITH TOMOSYNTHESIS AND CAD
TECHNIQUE: Bilateral digital diagnostic mammography and breast tomosynthesis
was performed. The images were evaluated with computer-aided
detection.

[L MLO synth-2D]
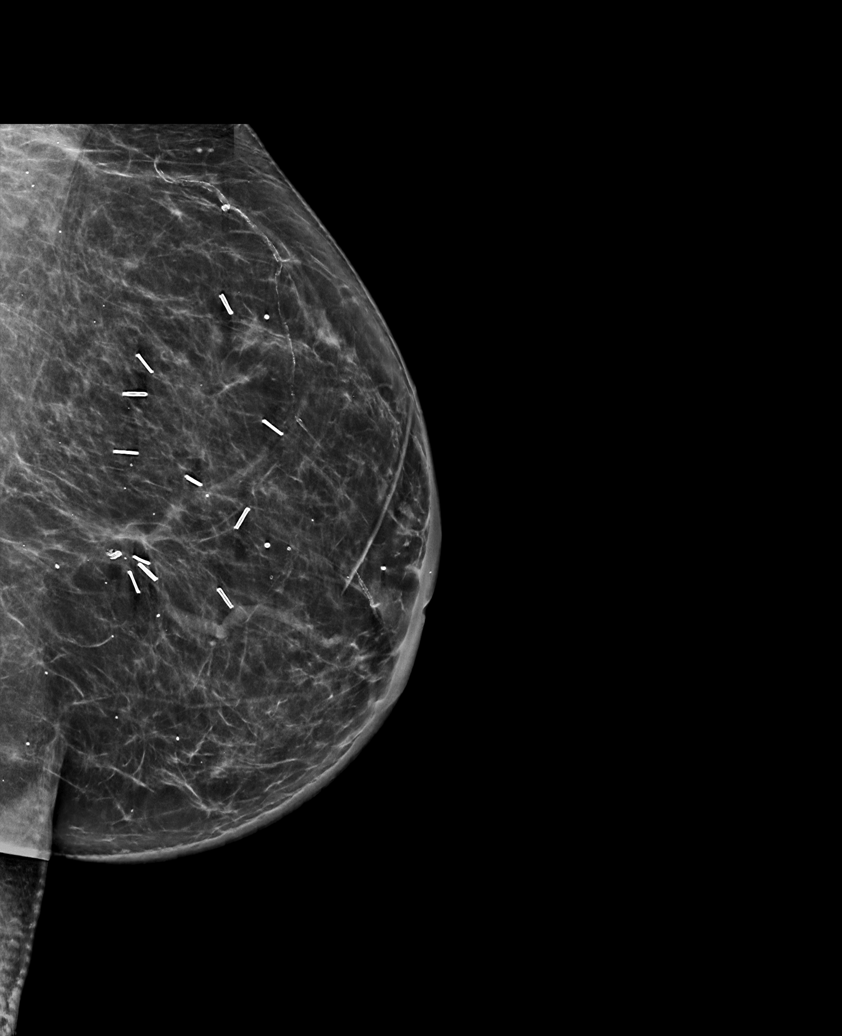

[R CC synth-2D]
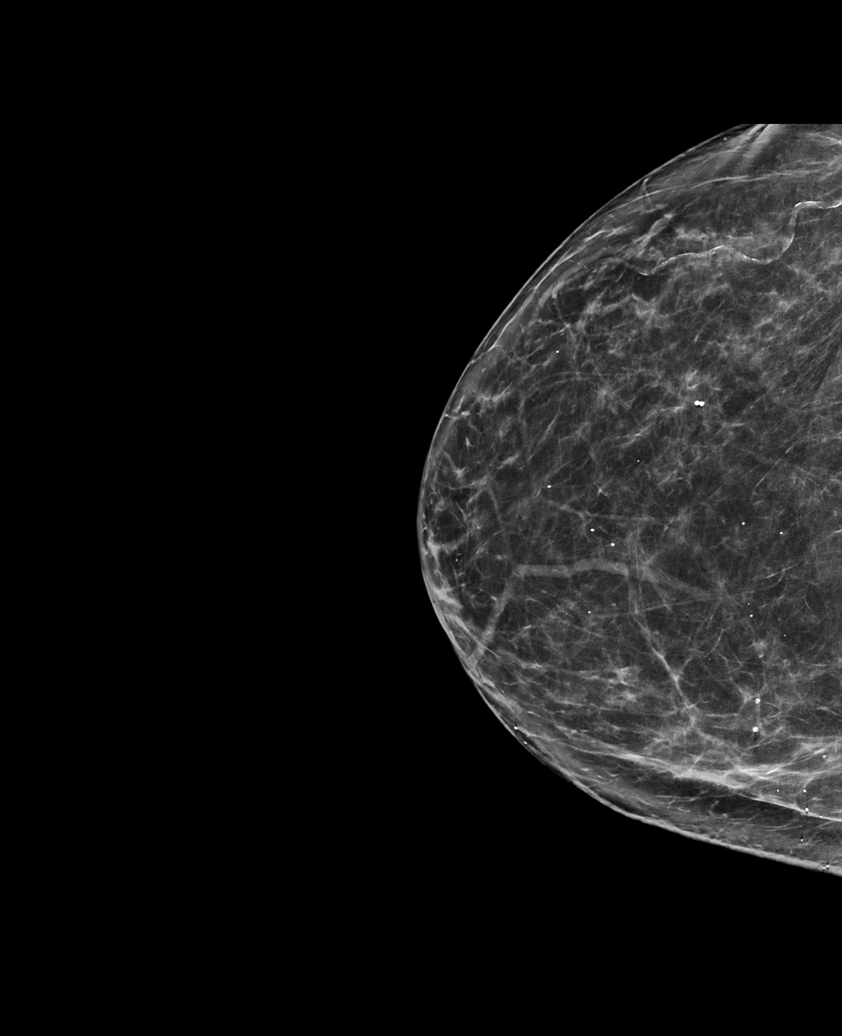

[L CC synth-2D (1 of 2)]
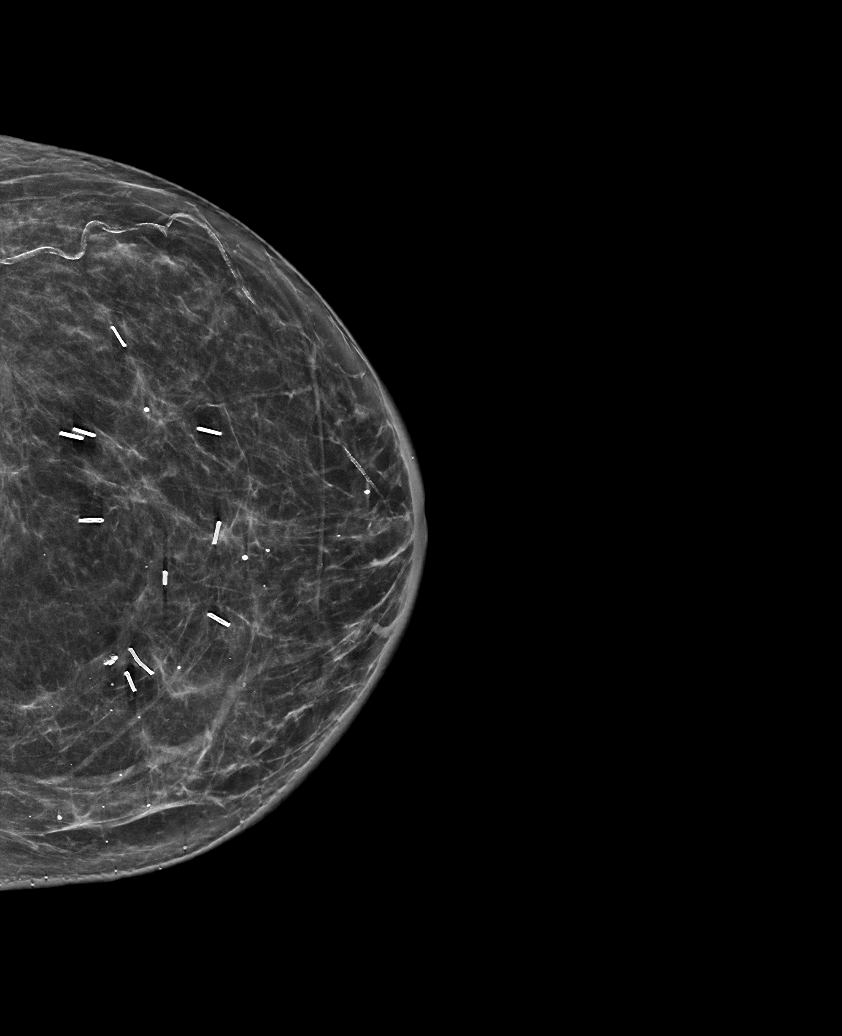

[L CC synth-2D (2 of 2)]
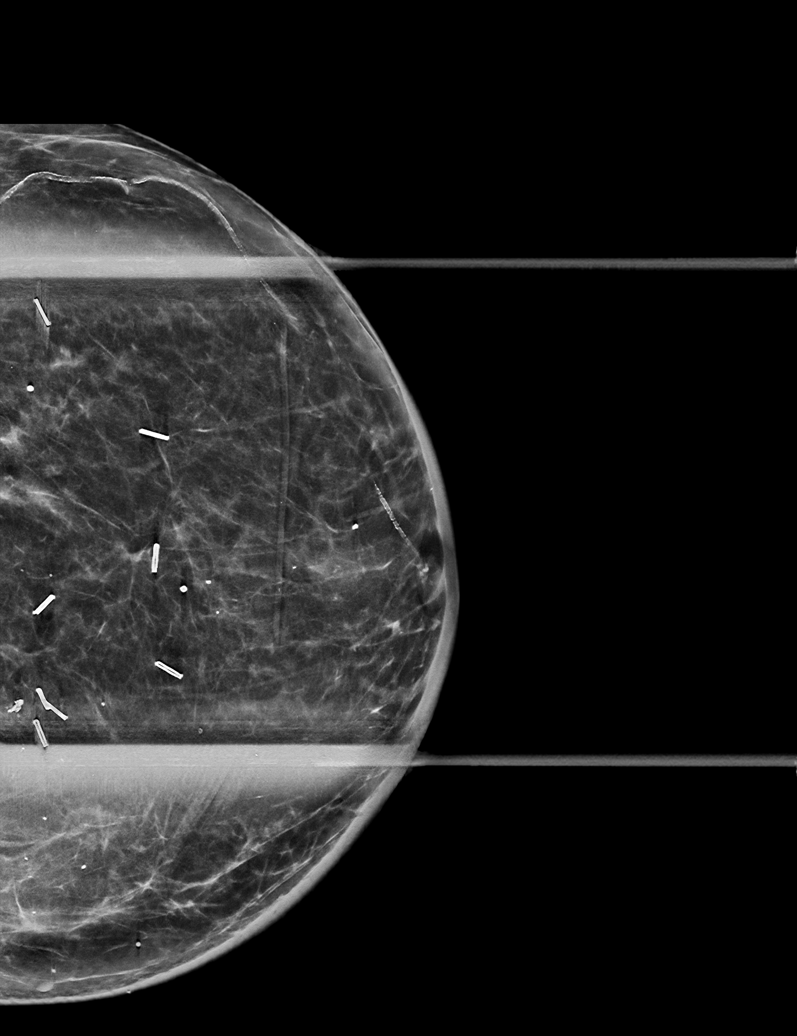

[R MLO synth-2D]
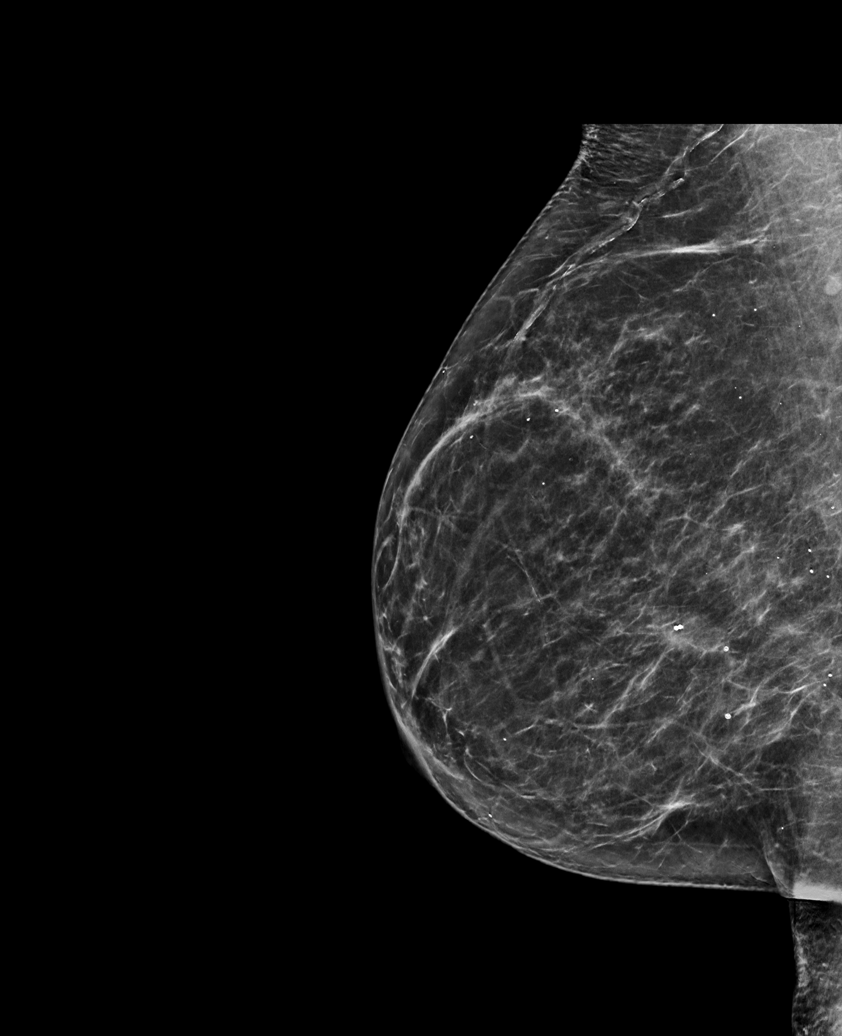

[R MLO tomo · tomo slice 37/72.0]
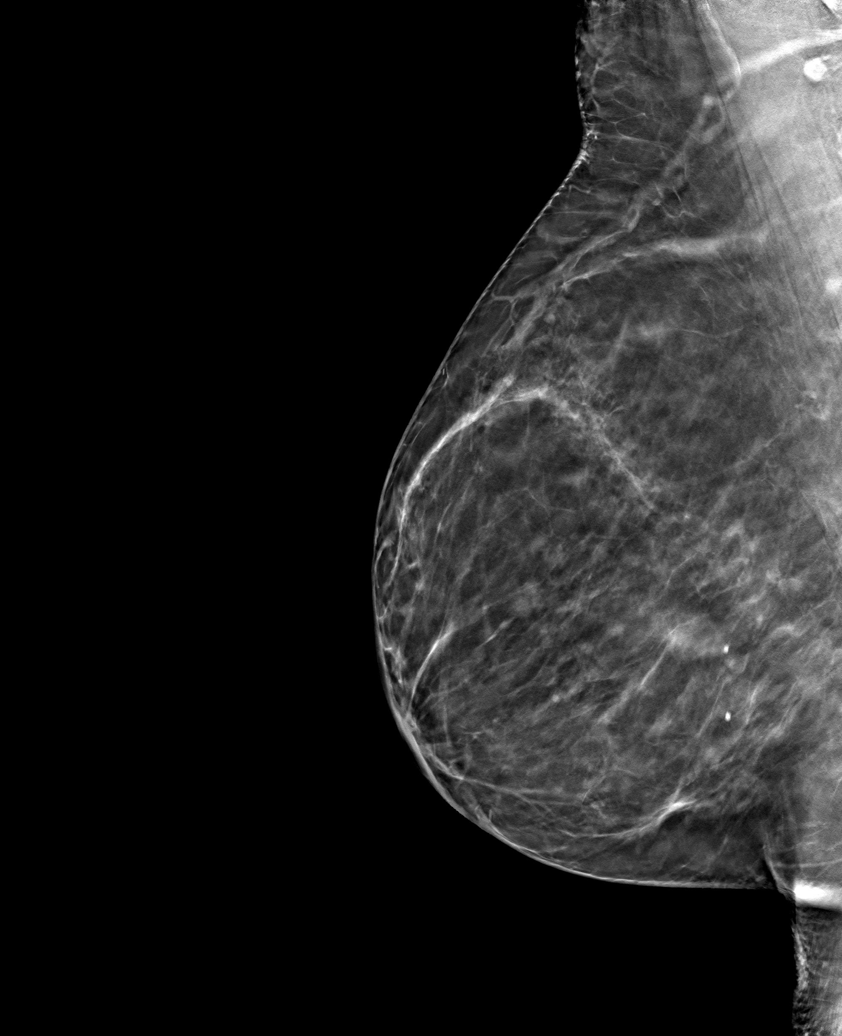

[6 of 30 positions shown; findings below may reference images not displayed]

ACR Breast Density Category b: There are scattered areas of
fibroglandular density.
FINDINGS: Cc and MLO views of bilateral breasts, spot tangential view of left
breast are submitted. Stable postsurgical changes identified in the
left breast. No suspicious abnormality is identified bilaterally.
IMPRESSION: Benign findings.

RECOMMENDATION:
Routine screening mammogram in 1 year.

I have discussed the findings and recommendations with the patient.
If applicable, a reminder letter will be sent to the patient
regarding the next appointment.

BI-RADS CATEGORY  2: Benign.

## 2021-07-02 DIAGNOSIS — J0100 Acute maxillary sinusitis, unspecified: Secondary | ICD-10-CM | POA: Diagnosis not present

## 2021-07-10 ENCOUNTER — Ambulatory Visit
Admission: RE | Admit: 2021-07-10 | Discharge: 2021-07-10 | Disposition: A | Discharge status: Home / Self Care | Payer: Medicare Other | Source: Ambulatory Visit | Attending: Family Medicine | Admitting: Family Medicine

## 2021-07-10 ENCOUNTER — Other Ambulatory Visit: Payer: Self-pay | Admitting: Family Medicine

## 2021-07-10 ENCOUNTER — Other Ambulatory Visit: Payer: Self-pay

## 2021-07-10 DIAGNOSIS — R053 Chronic cough: Secondary | ICD-10-CM

## 2021-09-16 ENCOUNTER — Other Ambulatory Visit: Payer: Self-pay | Admitting: Family Medicine

## 2021-09-16 DIAGNOSIS — Z1231 Encounter for screening mammogram for malignant neoplasm of breast: Secondary | ICD-10-CM

## 2021-10-21 ENCOUNTER — Ambulatory Visit
Admission: RE | Admit: 2021-10-21 | Discharge: 2021-10-21 | Disposition: A | Discharge status: Home / Self Care | Payer: Medicare Other | Source: Ambulatory Visit | Attending: Family Medicine | Admitting: Family Medicine

## 2021-10-21 DIAGNOSIS — Z1231 Encounter for screening mammogram for malignant neoplasm of breast: Secondary | ICD-10-CM | POA: Diagnosis not present

## 2021-11-06 DIAGNOSIS — Z419 Encounter for procedure for purposes other than remedying health state, unspecified: Secondary | ICD-10-CM | POA: Diagnosis not present

## 2021-11-06 DIAGNOSIS — L820 Inflamed seborrheic keratosis: Secondary | ICD-10-CM | POA: Diagnosis not present

## 2021-11-06 DIAGNOSIS — L821 Other seborrheic keratosis: Secondary | ICD-10-CM | POA: Diagnosis not present

## 2021-11-06 DIAGNOSIS — D485 Neoplasm of uncertain behavior of skin: Secondary | ICD-10-CM | POA: Diagnosis not present

## 2022-04-22 DIAGNOSIS — U071 COVID-19: Secondary | ICD-10-CM | POA: Diagnosis not present

## 2022-07-02 DIAGNOSIS — Z23 Encounter for immunization: Secondary | ICD-10-CM | POA: Diagnosis not present

## 2022-07-02 DIAGNOSIS — E559 Vitamin D deficiency, unspecified: Secondary | ICD-10-CM | POA: Diagnosis not present

## 2022-07-02 DIAGNOSIS — E7800 Pure hypercholesterolemia, unspecified: Secondary | ICD-10-CM | POA: Diagnosis not present

## 2022-07-02 DIAGNOSIS — Z79899 Other long term (current) drug therapy: Secondary | ICD-10-CM | POA: Diagnosis not present

## 2022-07-02 DIAGNOSIS — Z0000 Encounter for general adult medical examination without abnormal findings: Secondary | ICD-10-CM | POA: Diagnosis not present

## 2022-09-08 ENCOUNTER — Other Ambulatory Visit: Payer: Self-pay | Admitting: Family Medicine

## 2022-09-08 DIAGNOSIS — Z1231 Encounter for screening mammogram for malignant neoplasm of breast: Secondary | ICD-10-CM

## 2022-09-09 DIAGNOSIS — K080 Exfoliation of teeth due to systemic causes: Secondary | ICD-10-CM | POA: Diagnosis not present

## 2022-10-01 DIAGNOSIS — U071 COVID-19: Secondary | ICD-10-CM | POA: Diagnosis not present

## 2022-10-13 DIAGNOSIS — R051 Acute cough: Secondary | ICD-10-CM | POA: Diagnosis not present

## 2022-10-13 DIAGNOSIS — Z03818 Encounter for observation for suspected exposure to other biological agents ruled out: Secondary | ICD-10-CM | POA: Diagnosis not present

## 2022-10-13 DIAGNOSIS — J069 Acute upper respiratory infection, unspecified: Secondary | ICD-10-CM | POA: Diagnosis not present

## 2022-10-13 DIAGNOSIS — J209 Acute bronchitis, unspecified: Secondary | ICD-10-CM | POA: Diagnosis not present

## 2022-10-24 ENCOUNTER — Ambulatory Visit
Admission: RE | Admit: 2022-10-24 | Discharge: 2022-10-24 | Disposition: A | Discharge status: Home / Self Care | Payer: Medicare Other | Source: Ambulatory Visit | Attending: Family Medicine | Admitting: Family Medicine

## 2022-10-24 DIAGNOSIS — Z1231 Encounter for screening mammogram for malignant neoplasm of breast: Secondary | ICD-10-CM | POA: Diagnosis not present

## 2022-11-11 DIAGNOSIS — H04123 Dry eye syndrome of bilateral lacrimal glands: Secondary | ICD-10-CM | POA: Diagnosis not present

## 2022-11-11 DIAGNOSIS — H0102B Squamous blepharitis left eye, upper and lower eyelids: Secondary | ICD-10-CM | POA: Diagnosis not present

## 2022-11-11 DIAGNOSIS — H2513 Age-related nuclear cataract, bilateral: Secondary | ICD-10-CM | POA: Diagnosis not present

## 2022-11-11 DIAGNOSIS — H0102A Squamous blepharitis right eye, upper and lower eyelids: Secondary | ICD-10-CM | POA: Diagnosis not present

## 2023-01-13 DIAGNOSIS — R030 Elevated blood-pressure reading, without diagnosis of hypertension: Secondary | ICD-10-CM | POA: Diagnosis not present

## 2023-01-13 DIAGNOSIS — Z0000 Encounter for general adult medical examination without abnormal findings: Secondary | ICD-10-CM | POA: Diagnosis not present

## 2023-01-13 DIAGNOSIS — R6884 Jaw pain: Secondary | ICD-10-CM | POA: Diagnosis not present

## 2023-01-13 DIAGNOSIS — H9201 Otalgia, right ear: Secondary | ICD-10-CM | POA: Diagnosis not present

## 2023-03-12 DIAGNOSIS — K080 Exfoliation of teeth due to systemic causes: Secondary | ICD-10-CM | POA: Diagnosis not present

## 2023-07-06 DIAGNOSIS — Z79899 Other long term (current) drug therapy: Secondary | ICD-10-CM | POA: Diagnosis not present

## 2023-07-06 DIAGNOSIS — E7800 Pure hypercholesterolemia, unspecified: Secondary | ICD-10-CM | POA: Diagnosis not present

## 2023-07-06 DIAGNOSIS — Z0000 Encounter for general adult medical examination without abnormal findings: Secondary | ICD-10-CM | POA: Diagnosis not present

## 2023-07-06 DIAGNOSIS — Z23 Encounter for immunization: Secondary | ICD-10-CM | POA: Diagnosis not present

## 2023-07-06 DIAGNOSIS — E559 Vitamin D deficiency, unspecified: Secondary | ICD-10-CM | POA: Diagnosis not present

## 2023-09-10 DIAGNOSIS — K080 Exfoliation of teeth due to systemic causes: Secondary | ICD-10-CM | POA: Diagnosis not present

## 2023-10-01 ENCOUNTER — Other Ambulatory Visit: Payer: Self-pay | Admitting: Family Medicine

## 2023-10-01 DIAGNOSIS — Z1231 Encounter for screening mammogram for malignant neoplasm of breast: Secondary | ICD-10-CM

## 2023-10-27 ENCOUNTER — Ambulatory Visit
Admission: RE | Admit: 2023-10-27 | Discharge: 2023-10-27 | Disposition: A | Discharge status: Home / Self Care | Payer: Medicare Other | Source: Ambulatory Visit | Attending: Family Medicine | Admitting: Family Medicine

## 2023-10-27 DIAGNOSIS — Z1231 Encounter for screening mammogram for malignant neoplasm of breast: Secondary | ICD-10-CM | POA: Diagnosis not present

## 2023-10-29 ENCOUNTER — Other Ambulatory Visit: Payer: Self-pay | Admitting: Family Medicine

## 2023-10-29 DIAGNOSIS — R928 Other abnormal and inconclusive findings on diagnostic imaging of breast: Secondary | ICD-10-CM

## 2023-11-11 DIAGNOSIS — H00019 Hordeolum externum unspecified eye, unspecified eyelid: Secondary | ICD-10-CM | POA: Diagnosis not present

## 2023-11-11 DIAGNOSIS — H02849 Edema of unspecified eye, unspecified eyelid: Secondary | ICD-10-CM | POA: Diagnosis not present

## 2023-11-12 ENCOUNTER — Ambulatory Visit

## 2023-11-12 ENCOUNTER — Ambulatory Visit
Admission: RE | Admit: 2023-11-12 | Discharge: 2023-11-12 | Disposition: A | Discharge status: Home / Self Care | Source: Ambulatory Visit | Attending: Family Medicine | Admitting: Family Medicine

## 2023-11-12 DIAGNOSIS — R928 Other abnormal and inconclusive findings on diagnostic imaging of breast: Secondary | ICD-10-CM

## 2024-01-15 ENCOUNTER — Other Ambulatory Visit (HOSPITAL_BASED_OUTPATIENT_CLINIC_OR_DEPARTMENT_OTHER): Payer: Self-pay | Admitting: Family Medicine

## 2024-01-15 DIAGNOSIS — E2839 Other primary ovarian failure: Secondary | ICD-10-CM

## 2024-03-21 DIAGNOSIS — K080 Exfoliation of teeth due to systemic causes: Secondary | ICD-10-CM | POA: Diagnosis not present

## 2024-03-23 DIAGNOSIS — H0102A Squamous blepharitis right eye, upper and lower eyelids: Secondary | ICD-10-CM | POA: Diagnosis not present

## 2024-03-23 DIAGNOSIS — H04123 Dry eye syndrome of bilateral lacrimal glands: Secondary | ICD-10-CM | POA: Diagnosis not present

## 2024-03-23 DIAGNOSIS — H2513 Age-related nuclear cataract, bilateral: Secondary | ICD-10-CM | POA: Diagnosis not present

## 2024-03-23 DIAGNOSIS — H0102B Squamous blepharitis left eye, upper and lower eyelids: Secondary | ICD-10-CM | POA: Diagnosis not present

## 2024-05-23 DIAGNOSIS — Z23 Encounter for immunization: Secondary | ICD-10-CM | POA: Diagnosis not present

## 2024-05-23 DIAGNOSIS — M25572 Pain in left ankle and joints of left foot: Secondary | ICD-10-CM | POA: Diagnosis not present

## 2024-06-02 DIAGNOSIS — S99812A Other specified injuries of left ankle, initial encounter: Secondary | ICD-10-CM | POA: Diagnosis not present

## 2024-06-02 DIAGNOSIS — M19072 Primary osteoarthritis, left ankle and foot: Secondary | ICD-10-CM | POA: Diagnosis not present

## 2024-06-18 DIAGNOSIS — M25572 Pain in left ankle and joints of left foot: Secondary | ICD-10-CM | POA: Diagnosis not present

## 2024-06-23 DIAGNOSIS — K080 Exfoliation of teeth due to systemic causes: Secondary | ICD-10-CM | POA: Diagnosis not present

## 2024-06-28 DIAGNOSIS — S86112A Strain of other muscle(s) and tendon(s) of posterior muscle group at lower leg level, left leg, initial encounter: Secondary | ICD-10-CM | POA: Diagnosis not present

## 2024-06-28 DIAGNOSIS — M19072 Primary osteoarthritis, left ankle and foot: Secondary | ICD-10-CM | POA: Diagnosis not present

## 2024-07-11 DIAGNOSIS — E559 Vitamin D deficiency, unspecified: Secondary | ICD-10-CM | POA: Diagnosis not present

## 2024-07-11 DIAGNOSIS — E7800 Pure hypercholesterolemia, unspecified: Secondary | ICD-10-CM | POA: Diagnosis not present

## 2024-07-11 DIAGNOSIS — Z0000 Encounter for general adult medical examination without abnormal findings: Secondary | ICD-10-CM | POA: Diagnosis not present

## 2024-07-11 DIAGNOSIS — E2839 Other primary ovarian failure: Secondary | ICD-10-CM | POA: Diagnosis not present

## 2024-07-11 DIAGNOSIS — Z79899 Other long term (current) drug therapy: Secondary | ICD-10-CM | POA: Diagnosis not present

## 2024-07-11 DIAGNOSIS — R000 Tachycardia, unspecified: Secondary | ICD-10-CM | POA: Diagnosis not present

## 2024-07-20 DIAGNOSIS — S86112D Strain of other muscle(s) and tendon(s) of posterior muscle group at lower leg level, left leg, subsequent encounter: Secondary | ICD-10-CM | POA: Diagnosis not present

## 2024-07-20 DIAGNOSIS — M19072 Primary osteoarthritis, left ankle and foot: Secondary | ICD-10-CM | POA: Diagnosis not present

## 2024-08-14 NOTE — Progress Notes (Unsigned)
" °  Electrophysiology Office Note:    Date:  08/15/2024   ID:  Melissa, Holloway March 08, 1946, MRN [id number]  PCP:  Chrystal Lamarr RAMAN, MD   [facility] Providers Cardiologist:  None     Referring MD: Chrystal Lamarr RAMAN, MD   History of Present Illness:    Melissa Holloway is a 79 y.o. female with a medical history significant for recent onset atrial fibrillation, morbid obesity, Nissen fundoplication, invasive ductal carcinoma referred for arrhythmia management.       Discussed the use of AI scribe software for clinical note transcription with the patient, who gave verbal consent to proceed.  History of Present Illness Melissa Holloway is a 79 year old female with atrial fibrillation who presents for management of her condition.  She was diagnosed with atrial fibrillation on December 8th, 2025, at her annual wellness visit. Before diagnosis she was largely asymptomatic, with one prior episode of accelerated heart rate detected on her watch and a slight reduction in workout intensity.  She has taken metoprolol once daily and Eliquis twice daily since diagnosis. She finds Eliquis costly but is managing to obtain and take it.  She notes mild decreased energy but no significant shortness of breath, fatigue, or fainting episodes.         Today, she reports that she feels well and has no acute complaints  EKGs/Labs/Other Studies Reviewed Today:     Echocardiogram:  TTE login Results pending     EKG:   EKG Interpretation Date/Time:  Monday August 15 2024 08:53:32 EST Ventricular Rate:  116 PR Interval:    QRS Duration:  80 QT Interval:  318 QTC Calculation: 442 R Axis:   74  Text Interpretation: Atrial fibrillation with rapid ventricular response Nonspecific ST and T wave abnormality When compared with ECG of 03-Jan-2014 10:30, Atrial fibrillation has replaced Sinus rhythm Vent. rate has increased BY  51 BPM Questionable change in QRS axis ST now  depressed in Lateral leads T wave inversion more evident in Inferior leads Confirmed by Nancey Scotts [phone]) on 08/15/2024 9:07:34 AM     Physical Exam:    VS:  BP (!) 140/86 (BP Location: Right Arm, Patient Position: Sitting, Cuff Size: Normal)   Pulse (!) 116   Ht 5' 3 (1.6 m)   Wt 172 lb (78 kg)   SpO2 96%   BMI 30.47 kg/m     Wt Readings from Last 3 Encounters:  08/15/24 172 lb (78 kg)  12/28/19 195 lb 14.4 oz (88.9 kg)  11/26/17 188 lb 3.2 oz (85.4 kg)     GEN: Well nourished, well developed in no acute distress CARDIAC: iRRR, no murmurs, rubs, gallops RESPIRATORY:  Normal work of breathing MUSCULOSKELETAL: no edema    ASSESSMENT & PLAN:     Persistent atrial fibrillation Diagnosed December 2025 Mildly symptomatic with fatigue We discussed management options.  I explained options for rhythm control, and using a shared decision making approach, we have opted to pursue an ablation strategy. I explained the logistics, risks of the A-fib ablation including but not limited to vascular injury, need for prolonged hospitalization, need for cardiac surgery, and a low but nonzero risk of stroke heart attack or death. Will schedule DC cardioversion Will order TTE  Morbid obesity Discussed the importance of weight loss for maintenance of sinus rhythm   Signed, Scotts FORBES Nancey, MD  08/15/2024 9:28 AM    [facility] "

## 2024-08-14 NOTE — H&P (View-Only) (Signed)
" °  Electrophysiology Office Note:    Date:  08/15/2024   ID:  Melissa Holloway, Melissa Holloway [birth year], MRN [id number]  PCP:  Chrystal Lamarr RAMAN, MD   [facility] Providers Cardiologist:  None     Referring MD: Chrystal Lamarr RAMAN, MD   History of Present Illness:    Melissa Holloway is a 79 y.o. female with a medical history significant for recent onset atrial fibrillation, morbid obesity, Nissen fundoplication, invasive ductal carcinoma referred for arrhythmia management.       Discussed the use of AI scribe software for clinical note transcription with the patient, who gave verbal consent to proceed.  History of Present Illness Melissa Holloway is a 79 year old female with atrial fibrillation who presents for management of her condition.  She was diagnosed with atrial fibrillation on December 8th, 2025, at her annual wellness visit. Before diagnosis she was largely asymptomatic, with one prior episode of accelerated heart rate detected on her watch and a slight reduction in workout intensity.  She has taken metoprolol once daily and Eliquis twice daily since diagnosis. She finds Eliquis costly but is managing to obtain and take it.  She notes mild decreased energy but no significant shortness of breath, fatigue, or fainting episodes.         Today, she reports that she feels well and has no acute complaints  EKGs/Labs/Other Studies Reviewed Today:     Echocardiogram:  TTE login Results pending     EKG:   EKG Interpretation Date/Time:  Monday August 15 2024 08:53:32 EST Ventricular Rate:  116 PR Interval:    QRS Duration:  80 QT Interval:  318 QTC Calculation: 442 R Axis:   74  Text Interpretation: Atrial fibrillation with rapid ventricular response Nonspecific ST and T wave abnormality When compared with ECG of 03-Jan-2014 10:30, Atrial fibrillation has replaced Sinus rhythm Vent. rate has increased BY  51 BPM Questionable change in QRS axis ST now  depressed in Lateral leads T wave inversion more evident in Inferior leads Confirmed by Nancey Scotts [phone]) on 08/15/2024 9:07:34 AM     Physical Exam:    VS:  BP (!) 140/86 (BP Location: Right Arm, Patient Position: Sitting, Cuff Size: Normal)   Pulse (!) 116   Ht 5' 3 (1.6 m)   Wt 172 lb (78 kg)   SpO2 96%   BMI 30.47 kg/m     Wt Readings from Last 3 Encounters:  08/15/24 172 lb (78 kg)  12/28/19 195 lb 14.4 oz (88.9 kg)  11/26/17 188 lb 3.2 oz (85.4 kg)     GEN: Well nourished, well developed in no acute distress CARDIAC: iRRR, no murmurs, rubs, gallops RESPIRATORY:  Normal work of breathing MUSCULOSKELETAL: no edema    ASSESSMENT & PLAN:     Persistent atrial fibrillation Diagnosed December 2025 Mildly symptomatic with fatigue We discussed management options.  I explained options for rhythm control, and using a shared decision making approach, we have opted to pursue an ablation strategy. I explained the logistics, risks of the A-fib ablation including but not limited to vascular injury, need for prolonged hospitalization, need for cardiac surgery, and a low but nonzero risk of stroke heart attack or death. Will schedule DC cardioversion Will order TTE  Morbid obesity Discussed the importance of weight loss for maintenance of sinus rhythm   Signed, Scotts FORBES Nancey, MD  08/15/2024 9:28 AM    [facility] "

## 2024-08-15 ENCOUNTER — Ambulatory Visit: Attending: Cardiovascular Disease | Admitting: Cardiovascular Disease

## 2024-08-15 ENCOUNTER — Encounter: Payer: Self-pay | Admitting: Cardiovascular Disease

## 2024-08-15 VITALS — BP 140/86 | HR 116 | Ht 63.0 in | Wt 172.0 lb

## 2024-08-15 DIAGNOSIS — I4891 Unspecified atrial fibrillation: Secondary | ICD-10-CM | POA: Diagnosis not present

## 2024-08-15 DIAGNOSIS — Z01812 Encounter for preprocedural laboratory examination: Secondary | ICD-10-CM

## 2024-08-15 NOTE — Addendum Note (Signed)
 Addended by: CASIMIR ALDONA BRAVO on: 08/15/2024 05:18 PM   Modules accepted: Orders

## 2024-08-15 NOTE — Patient Instructions (Addendum)
 "        Medication Instructions:  Your physician recommends that you continue on your current medications as directed. Please refer to the Current Medication list given to you today.  *If you need a refill on your cardiac medications before your next appointment, please call your pharmacy*  Testing/Procedures: Your physician has requested that you have an echocardiogram. Echocardiography is a painless test that uses sound waves to create images of your heart. It provides your doctor with information about the size and shape of your heart and how well your hearts chambers and valves are working. This procedure takes approximately one hour. There are no restrictions for this procedure. Please do NOT wear cologne, perfume, aftershave, or lotions (deodorant is allowed). Please arrive 15 minutes prior to your appointment time.  Please note: We ask at that you not bring children with you during ultrasound (echo/ vascular) testing. Due to room size and safety concerns, children are not allowed in the ultrasound rooms during exams. Our front office staff cannot provide observation of children in our lobby area while testing is being conducted. An adult accompanying a patient to their appointment will only be allowed in the ultrasound room at the discretion of the ultrasound technician under special circumstances. We apologize for any inconvenience.  Ablation Your physician has recommended that you have an ablation. Catheter ablation is a medical procedure used to treat some cardiac arrhythmias (irregular heartbeats). During catheter ablation, a long, thin, flexible tube is put into a blood vessel in your groin (upper thigh), or neck. This tube is called an ablation catheter. It is then guided to your heart through the blood vessel. Radio frequency waves destroy small areas of heart tissue where abnormal heartbeats may cause an arrhythmia to start.   You are scheduled for Atrial Fibrillation Ablation on  Thursday, February 19 with Dr. Dr. Nancey. Please arrive at the Main Entrance A at [facility]: [address] [place], [place] [address] at 7:30 AM   What To Expect:  Labs: you will need to have lab work drawn within 30 days of your procedure. Please go to any [facility] location to have these drawn - no appointment is needed. You will receive procedure instructions either through MyChart or in the mail 4-6 week prior to your procedure.  After your procedure we recommend no driving for 4 days, no lifting over 5 lbs for 7 days, and no work or strenuous activity for 7 days.  Please contact our office at [phone] if you have any questions.    Follow-Up: We will contact you to schedule your post-procedure appointments.     Dear Rock Malcolm Orange  You are scheduled for a Cardioversion on Wednesday, January 14 with Dr. Ren.  Please arrive at the [facility] (Main Entrance A) at [facility]: [address] [place], [place] [address] at 10:30 AM (This time is 1.5 hour(s) before your procedure to ensure your preparation).   Free valet parking service is available. You will check in at ADMITTING.   *Please Note: You will receive a call the day before your procedure to confirm the appointment time. That time may have changed from the original time based on the schedule for that day.*   DIET:  Nothing to eat or drink after midnight except a sip of water  with medications (see medication instructions below)  MEDICATION INSTRUCTIONS: !!IF ANY NEW MEDICATIONS ARE STARTED AFTER TODAY, PLEASE NOTIFY YOUR PROVIDER AS SOON AS POSSIBLE!!   Continue taking  your anticoagulant (blood thinner): Apixaban (Eliquis).  You will need to continue this after your procedure until you are told by your provider that it is safe to stop.    LABS:  Your labs will be done at the hospital prior to your procedure - you will need to arrive 1 and 1/2 hours prior to your procedure.  FYI:  For your safety,  and to allow us  to monitor your vital signs accurately during the surgery/procedure we request: If you have artificial nails, gel coating, SNS etc, please have those removed prior to your surgery/procedure. Not having the nail coverings /polish removed may result in cancellation or delay of your surgery/procedure.  Your support person will be asked to wait in the waiting room during your procedure.  It is OK to have someone drop you off and come back when you are ready to be discharged.  You cannot drive after the procedure and will need someone to drive you home.  Bring your insurance cards.  *Special Note: Every effort is made to have your procedure done on time. Occasionally there are emergencies that occur at the hospital that may cause delays. Please be patient if a delay does occur.           "

## 2024-08-17 ENCOUNTER — Encounter (HOSPITAL_COMMUNITY): Admission: RE | Disposition: A | Payer: Self-pay

## 2024-08-17 ENCOUNTER — Ambulatory Visit (HOSPITAL_COMMUNITY)

## 2024-08-17 ENCOUNTER — Ambulatory Visit (HOSPITAL_COMMUNITY): Admission: RE | Admit: 2024-08-17 | Discharge: 2024-08-17 | Disposition: A | Discharge status: Home / Self Care

## 2024-08-17 ENCOUNTER — Other Ambulatory Visit: Payer: Self-pay

## 2024-08-17 ENCOUNTER — Encounter (HOSPITAL_COMMUNITY): Payer: Self-pay

## 2024-08-17 DIAGNOSIS — Z79899 Other long term (current) drug therapy: Secondary | ICD-10-CM | POA: Insufficient documentation

## 2024-08-17 DIAGNOSIS — I4891 Unspecified atrial fibrillation: Secondary | ICD-10-CM

## 2024-08-17 DIAGNOSIS — I4819 Other persistent atrial fibrillation: Secondary | ICD-10-CM | POA: Diagnosis present

## 2024-08-17 DIAGNOSIS — Z7901 Long term (current) use of anticoagulants: Secondary | ICD-10-CM | POA: Diagnosis not present

## 2024-08-17 DIAGNOSIS — Z853 Personal history of malignant neoplasm of breast: Secondary | ICD-10-CM | POA: Insufficient documentation

## 2024-08-17 DIAGNOSIS — Z6830 Body mass index (BMI) 30.0-30.9, adult: Secondary | ICD-10-CM | POA: Diagnosis not present

## 2024-08-17 HISTORY — PX: CARDIOVERSION: EP1203

## 2024-08-17 MED ORDER — PROPOFOL 10 MG/ML IV BOLUS
INTRAVENOUS | Status: DC | PRN
  Administered 2024-08-17: 70 mg via INTRAVENOUS

## 2024-08-17 MED ORDER — SODIUM CHLORIDE 0.9 % IV SOLN: INTRAVENOUS | Status: DC

## 2024-08-17 MED ORDER — LIDOCAINE HCL (CARDIAC) PF 100 MG/5ML IV SOSY
PREFILLED_SYRINGE | INTRAVENOUS | Status: DC | PRN
  Administered 2024-08-17: 50 mg via INTRAVENOUS

## 2024-08-17 NOTE — Anesthesia Postprocedure Evaluation (Signed)
"   Anesthesia Post Note  Patient: Melissa Holloway  Procedure(s) Performed: CARDIOVERSION     Patient location during evaluation: Cath Lab Anesthesia Type: General Level of consciousness: awake and alert Pain management: pain level controlled Vital Signs Assessment: post-procedure vital signs reviewed and stable Respiratory status: spontaneous breathing, nonlabored ventilation and respiratory function stable Cardiovascular status: blood pressure returned to baseline and stable Postop Assessment: no apparent nausea or vomiting Anesthetic complications: no   There were no known notable events for this encounter.  Last Vitals:  Vitals:   08/17/24 1307 08/17/24 1316  BP: (!) 140/75 (!) 141/77  Pulse: 64 71  Resp: 10 17  Temp:    SpO2: 100% 96%    Last Pain:  Vitals:   08/17/24 1307  TempSrc:   PainSc: 0-No pain                 [name]      "

## 2024-08-17 NOTE — Interval H&P Note (Signed)
 History and Physical Interval Note:  08/17/2024 11:33 AM  Melissa Holloway  has presented today for surgery, with the diagnosis of AFIB.  The various methods of treatment have been discussed with the patient and family. After consideration of risks, benefits and other options for treatment, the patient has consented to  Procedures: CARDIOVERSION (N/A) as a surgical intervention.  The patient's history has been reviewed, patient examined, no change in status, stable for surgery.  I have reviewed the patient's chart and labs.  Questions were answered to the patient's satisfaction.     Melissa Holloway

## 2024-08-17 NOTE — Transfer of Care (Signed)
 Immediate Anesthesia Transfer of Care Note  Patient: Melissa Holloway  Procedure(s) Performed: CARDIOVERSION  Patient Location: Cath Lab  Anesthesia Type:MAC  Level of Consciousness: drowsy  Airway & Oxygen Therapy: Patient Spontanous Breathing and Patient connected to nasal cannula oxygen  Post-op Assessment: Report given to RN and Post -op Vital signs reviewed and stable  Post vital signs: Reviewed and stable  Last Vitals:  Vitals Value Taken Time  BP 127/70 1236  Temp    Pulse 66   Resp 20   SpO2 96%     Last Pain:  Vitals:   08/17/24 1112  TempSrc: Tympanic  PainSc: 0-No pain         Complications: There were no known notable events for this encounter.

## 2024-08-17 NOTE — Discharge Instructions (Signed)
 SABRA

## 2024-08-17 NOTE — Anesthesia Preprocedure Evaluation (Signed)
"                                    Anesthesia Evaluation  Patient identified by MRN, date of birth, ID band Patient awake    Reviewed: Allergy & Precautions, NPO status , Patient's Chart, lab work & pertinent test results  History of Anesthesia Complications (+) PONV and history of anesthetic complications  Airway Mallampati: III  TM Distance: >3 FB Neck ROM: Full    Dental  (+) Teeth Intact, Dental Advisory Given   Pulmonary neg shortness of breath, neg sleep apnea, neg COPD, neg recent URI   breath sounds clear to auscultation       Cardiovascular (-) angina (-) Past MI + dysrhythmias Atrial Fibrillation  Rhythm:Irregular     Neuro/Psych neg Seizures  Neuromuscular disease    GI/Hepatic Neg liver ROS, hiatal hernia,,,  Endo/Other  negative endocrine ROS    Renal/GU negative Renal ROS     Musculoskeletal  (+) Arthritis ,    Abdominal   Peds  Hematology  (+) Blood dyscrasia   Anesthesia Other Findings   Reproductive/Obstetrics                              Anesthesia Physical Anesthesia Plan  ASA: 2  Anesthesia Plan: General   Post-op Pain Management: Minimal or no pain anticipated   Induction: Intravenous  PONV Risk Score and Plan: Treatment may vary due to age or medical condition  Airway Management Planned: Nasal Cannula, Natural Airway and Simple Face Mask  Additional Equipment: None  Intra-op Plan:   Post-operative Plan:   Informed Consent: I have reviewed the patients History and Physical, chart, labs and discussed the procedure including the risks, benefits and alternatives for the proposed anesthesia with the patient or authorized representative who has indicated his/her understanding and acceptance.     Dental advisory given  Plan Discussed with: CRNA  Anesthesia Plan Comments:          Anesthesia Quick Evaluation  "

## 2024-08-17 NOTE — CV Procedure (Signed)
" ° °  DIRECT CURRENT CARDIOVERSION  NAME:  Melissa Holloway    MRN: [id number] DOB:  Nov 18, 1945    ADMIT DATE: 08/17/2024  Indication:  Symptomatic atrial fibrillation  Procedure Note:  The patient signed informed consent.  They have had had therapeutic anticoagulation with greater than 3 weeks.  Anesthesia was administered by Dr. Leopoldo.  Adequate airway was maintained throughout and vital followed per protocol.  They were cardioverted x 1 with 200J of biphasic synchronized energy.  They converted to NSR.  There were no apparent complications.  The patient had normal neuro status and respiratory status post procedure with vitals stable as recorded elsewhere.    Follow up: They will continue on current medical therapy and follow up with cardiology as scheduled.  [name], MD, [facility]  [facility]  12:35 PM  "

## 2024-08-23 ENCOUNTER — Telehealth: Payer: Self-pay

## 2024-08-23 NOTE — Telephone Encounter (Signed)
-----   Message from Nurse Aldona SAUNDERS, RN sent at 08/15/2024  5:09 PM EST ----- Regarding: 2/19 Afib Mealor Important: list procedure date as first item in subject line, followed by procedure type (e.g., 04/15/24 AFib ablation)  Precert:  MD: Mealor Type of ablation: A-fib Diagnosis: Afib CPT code: A-fib (06343) Ablation scheduled (date/time): 2/19 930am  Procedure:  Added to calendar? Yes Orders entered? No, >30 days before procedure Letter complete? No, >30 days before procedure Scheduled with cath lab? Yes Any medications to hold? Routine Labs ordered (CBC, BMET, PT/INR if on warfarin): Yes Mapping system: Doesn't matter CARTO/OPAL rep notified? No Cardiac CT needed? No Dye allergy? No Pre-meds ordered and instructions given? N/A Letter method: MyChart H&P: 1/12 Device: No  Follow-up:  Cassie/Angel, please schedule Routine.  Covering RN - please send this message to [employer], EP scheduler, EP Scheduling pool, EP [employer], and CT scheduler (Brittany Lynch/Stephanie Mogg), if indicated.

## 2024-08-24 ENCOUNTER — Encounter (HOSPITAL_COMMUNITY): Payer: Self-pay

## 2024-08-24 ENCOUNTER — Telehealth (HOSPITAL_COMMUNITY): Payer: Self-pay

## 2024-08-24 NOTE — Telephone Encounter (Signed)
 Spoke with patient to complete pre-procedure call.     Health status review:  Any new medical conditions, recent signs of acute illness or been started on antibiotics? No Any recent hospitalizations or surgeries? DCCV 08/17/24 Any new medications started since pre-op visit? No  Follow all medication instructions prior to procedure or the procedure may be rescheduled:    Continue taking Eliquis (Apixaban) twice daily without missing any doses before procedure. Essential chronic medications:  No medication should be continued, unless told otherwise. On the morning of your procedure DO NOT take any medication., including Eliquis (Apixaban).  Nothing to eat or drink after midnight prior to your procedure.  Pre-procedure testing scheduled: CT not required and lab work by  February 5.  Confirmed patient is scheduled for Atrial Fibrillation Ablation, Atrial Flutter Ablation on Thursday, February 19 with Dr. Nancey. Instructed patient to arrive at the Main Entrance A at [facility]: [address] [place], [place] [address] and check in at Admitting at 7:30 AM.  Plan to go home the same day, you will only stay overnight if medically necessary. You MUST have a responsible adult to drive you home and MUST be with you the first 24 hours after you arrive home or your procedure could be cancelled.  Informed a nurse may call a day before the procedure to confirm arrival time and ensure instructions are followed.  Patient verbalized understanding to information provided and is agreeable to proceed with procedure.   Advised to contact RN Navigator at [phone], to inform of any new medications started after call or concerns prior to procedure.

## 2024-08-31 LAB — CBC

## 2024-09-01 ENCOUNTER — Ambulatory Visit: Payer: Self-pay

## 2024-09-01 LAB — BASIC METABOLIC PANEL WITH GFR
BUN/Creatinine Ratio: 12 (ref 12–28)
BUN: 8 mg/dL (ref 8–27)
CO2: 25 mmol/L (ref 20–29)
Calcium: 10.8 mg/dL — ABNORMAL HIGH (ref 8.7–10.3)
Chloride: 103 mmol/L (ref 96–106)
Creatinine, Ser: 0.65 mg/dL (ref 0.57–1.00)
Glucose: 91 mg/dL (ref 70–99)
Potassium: 5.2 mmol/L (ref 3.5–5.2)
Sodium: 143 mmol/L (ref 134–144)
eGFR: 91 mL/min/{1.73_m2}

## 2024-09-01 LAB — CBC
Hematocrit: 45.6 (ref 34.0–46.6)
Hemoglobin: 14.7 g/dL (ref 11.1–15.9)
MCH: 28.1 pg (ref 26.6–33.0)
MCHC: 32.2 g/dL (ref 31.5–35.7)
MCV: 87 fL (ref 79–97)
Platelets: 238 10*3/uL (ref 150–450)
RBC: 5.23 x10E6/uL (ref 3.77–5.28)
RDW: 13.6 (ref 11.7–15.4)
WBC: 4.5 10*3/uL (ref 3.4–10.8)

## 2024-09-07 ENCOUNTER — Ambulatory Visit (HOSPITAL_COMMUNITY)
Admission: RE | Admit: 2024-09-07 | Discharge: 2024-09-07 | Disposition: A | Source: Ambulatory Visit | Attending: Cardiovascular Disease

## 2024-09-07 DIAGNOSIS — I480 Paroxysmal atrial fibrillation: Secondary | ICD-10-CM | POA: Diagnosis not present

## 2024-09-07 DIAGNOSIS — R0609 Other forms of dyspnea: Secondary | ICD-10-CM | POA: Diagnosis not present

## 2024-09-07 DIAGNOSIS — I4891 Unspecified atrial fibrillation: Secondary | ICD-10-CM

## 2024-09-07 LAB — ECHOCARDIOGRAM COMPLETE
AR max vel: 2.09 cm2
AV Area VTI: 1.83 cm2
AV Area mean vel: 2.17 cm2
AV Mean grad: 4 mmHg
AV Peak grad: 7.7 mmHg
Ao pk vel: 1.39 m/s
Area-P 1/2: 6.6 cm2
S' Lateral: 2.55 cm

## 2024-09-22 ENCOUNTER — Encounter (HOSPITAL_COMMUNITY): Payer: Self-pay

## 2024-09-22 ENCOUNTER — Ambulatory Visit (HOSPITAL_COMMUNITY): Admit: 2024-09-22 | Admitting: Cardiovascular Disease

## 2024-09-22 SURGERY — ATRIAL FIBRILLATION ABLATION
Anesthesia: General
